# Patient Record
Sex: Female | Born: 1935
Health system: Southern US, Community
[De-identification: ages and names within clinical notes are randomized; demographics above are authoritative.]

## PROBLEM LIST (undated history)

## (undated) ENCOUNTER — Emergency Department (HOSPITAL_COMMUNITY): Admission: EM | Payer: PRIVATE HEALTH INSURANCE | Source: Home / Self Care

## (undated) DIAGNOSIS — F32A Depression, unspecified: Secondary | ICD-10-CM

## (undated) DIAGNOSIS — Z9889 Other specified postprocedural states: Secondary | ICD-10-CM

## (undated) DIAGNOSIS — K219 Gastro-esophageal reflux disease without esophagitis: Secondary | ICD-10-CM

## (undated) DIAGNOSIS — R112 Nausea with vomiting, unspecified: Secondary | ICD-10-CM

## (undated) DIAGNOSIS — F329 Major depressive disorder, single episode, unspecified: Secondary | ICD-10-CM

## (undated) DIAGNOSIS — C801 Malignant (primary) neoplasm, unspecified: Secondary | ICD-10-CM

## (undated) DIAGNOSIS — I10 Essential (primary) hypertension: Secondary | ICD-10-CM

## (undated) DIAGNOSIS — C50919 Malignant neoplasm of unspecified site of unspecified female breast: Secondary | ICD-10-CM

## (undated) DIAGNOSIS — Z923 Personal history of irradiation: Secondary | ICD-10-CM

## (undated) DIAGNOSIS — R011 Cardiac murmur, unspecified: Secondary | ICD-10-CM

## (undated) DIAGNOSIS — Z8489 Family history of other specified conditions: Secondary | ICD-10-CM

## (undated) DIAGNOSIS — E119 Type 2 diabetes mellitus without complications: Secondary | ICD-10-CM

## (undated) DIAGNOSIS — E039 Hypothyroidism, unspecified: Secondary | ICD-10-CM

## (undated) DIAGNOSIS — Z973 Presence of spectacles and contact lenses: Secondary | ICD-10-CM

## (undated) HISTORY — PX: BREAST LUMPECTOMY: SHX2

## (undated) HISTORY — PX: BUNIONECTOMY: SHX129

## (undated) HISTORY — PX: TONSILLECTOMY: SUR1361

## (undated) HISTORY — PX: ARCUATE KERATECTOMY: SHX212

## (undated) HISTORY — PX: ABDOMINAL HYSTERECTOMY: SHX81

## (undated) HISTORY — PX: OVARIAN CYST SURGERY: SHX726

## (undated) HISTORY — PX: APPENDECTOMY: SHX54

## (undated) HISTORY — PX: CARPAL TUNNEL RELEASE: SHX101

---

## 1976-11-25 HISTORY — PX: DEBRIDEMENT OF ABDOMINAL WALL ABSCESS: SHX6396

## 2008-11-25 DIAGNOSIS — Z923 Personal history of irradiation: Secondary | ICD-10-CM

## 2008-11-25 HISTORY — DX: Personal history of irradiation: Z92.3

## 2013-12-07 DIAGNOSIS — E119 Type 2 diabetes mellitus without complications: Secondary | ICD-10-CM | POA: Diagnosis not present

## 2013-12-07 DIAGNOSIS — B351 Tinea unguium: Secondary | ICD-10-CM | POA: Diagnosis not present

## 2013-12-07 DIAGNOSIS — I10 Essential (primary) hypertension: Secondary | ICD-10-CM | POA: Diagnosis not present

## 2013-12-08 DIAGNOSIS — G5600 Carpal tunnel syndrome, unspecified upper limb: Secondary | ICD-10-CM | POA: Diagnosis not present

## 2013-12-16 DIAGNOSIS — G5600 Carpal tunnel syndrome, unspecified upper limb: Secondary | ICD-10-CM | POA: Diagnosis not present

## 2013-12-28 DIAGNOSIS — L570 Actinic keratosis: Secondary | ICD-10-CM | POA: Diagnosis not present

## 2013-12-28 DIAGNOSIS — L259 Unspecified contact dermatitis, unspecified cause: Secondary | ICD-10-CM | POA: Diagnosis not present

## 2014-01-12 DIAGNOSIS — E119 Type 2 diabetes mellitus without complications: Secondary | ICD-10-CM | POA: Diagnosis not present

## 2014-02-02 DIAGNOSIS — I10 Essential (primary) hypertension: Secondary | ICD-10-CM | POA: Diagnosis not present

## 2014-02-02 DIAGNOSIS — E119 Type 2 diabetes mellitus without complications: Secondary | ICD-10-CM | POA: Diagnosis not present

## 2014-02-09 DIAGNOSIS — I10 Essential (primary) hypertension: Secondary | ICD-10-CM | POA: Diagnosis not present

## 2014-02-09 DIAGNOSIS — Z0000 Encounter for general adult medical examination without abnormal findings: Secondary | ICD-10-CM | POA: Diagnosis not present

## 2014-02-09 DIAGNOSIS — E119 Type 2 diabetes mellitus without complications: Secondary | ICD-10-CM | POA: Diagnosis not present

## 2014-02-09 DIAGNOSIS — E039 Hypothyroidism, unspecified: Secondary | ICD-10-CM | POA: Diagnosis not present

## 2014-03-21 ENCOUNTER — Other Ambulatory Visit: Payer: Self-pay | Admitting: Geriatric Medicine

## 2014-03-21 ENCOUNTER — Encounter (INDEPENDENT_AMBULATORY_CARE_PROVIDER_SITE_OTHER): Payer: Self-pay

## 2014-03-21 ENCOUNTER — Ambulatory Visit
Admission: RE | Admit: 2014-03-21 | Discharge: 2014-03-21 | Disposition: A | Discharge status: Home / Self Care | Payer: Medicare Other | Source: Ambulatory Visit | Attending: Geriatric Medicine | Admitting: Geriatric Medicine

## 2014-03-21 DIAGNOSIS — M546 Pain in thoracic spine: Secondary | ICD-10-CM

## 2014-03-21 DIAGNOSIS — E7800 Pure hypercholesterolemia, unspecified: Secondary | ICD-10-CM | POA: Diagnosis not present

## 2014-03-21 DIAGNOSIS — E1129 Type 2 diabetes mellitus with other diabetic kidney complication: Secondary | ICD-10-CM | POA: Diagnosis not present

## 2014-03-21 DIAGNOSIS — I129 Hypertensive chronic kidney disease with stage 1 through stage 4 chronic kidney disease, or unspecified chronic kidney disease: Secondary | ICD-10-CM | POA: Diagnosis not present

## 2014-03-21 DIAGNOSIS — N183 Chronic kidney disease, stage 3 unspecified: Secondary | ICD-10-CM | POA: Diagnosis not present

## 2014-03-21 DIAGNOSIS — J309 Allergic rhinitis, unspecified: Secondary | ICD-10-CM | POA: Diagnosis not present

## 2014-05-09 DIAGNOSIS — G5600 Carpal tunnel syndrome, unspecified upper limb: Secondary | ICD-10-CM | POA: Diagnosis not present

## 2014-05-09 DIAGNOSIS — M19049 Primary osteoarthritis, unspecified hand: Secondary | ICD-10-CM | POA: Diagnosis not present

## 2014-05-17 ENCOUNTER — Other Ambulatory Visit: Payer: Self-pay | Admitting: Orthopedic Surgery

## 2014-05-18 ENCOUNTER — Encounter (HOSPITAL_BASED_OUTPATIENT_CLINIC_OR_DEPARTMENT_OTHER): Payer: Self-pay | Admitting: *Deleted

## 2014-05-18 NOTE — Progress Notes (Signed)
To come for [id number]

## 2014-05-19 ENCOUNTER — Encounter (HOSPITAL_BASED_OUTPATIENT_CLINIC_OR_DEPARTMENT_OTHER)
Admission: RE | Admit: 2014-05-19 | Discharge: 2014-05-19 | Disposition: A | Discharge status: Home / Self Care | Payer: Medicare Other | Source: Ambulatory Visit | Attending: Orthopedic Surgery | Admitting: Orthopedic Surgery

## 2014-05-19 ENCOUNTER — Other Ambulatory Visit: Payer: Self-pay

## 2014-05-19 DIAGNOSIS — Z01810 Encounter for preprocedural cardiovascular examination: Secondary | ICD-10-CM | POA: Insufficient documentation

## 2014-05-19 DIAGNOSIS — M129 Arthropathy, unspecified: Secondary | ICD-10-CM | POA: Diagnosis not present

## 2014-05-19 DIAGNOSIS — M65849 Other synovitis and tenosynovitis, unspecified hand: Secondary | ICD-10-CM | POA: Diagnosis not present

## 2014-05-19 DIAGNOSIS — E119 Type 2 diabetes mellitus without complications: Secondary | ICD-10-CM | POA: Diagnosis not present

## 2014-05-19 DIAGNOSIS — Z01812 Encounter for preprocedural laboratory examination: Secondary | ICD-10-CM | POA: Diagnosis not present

## 2014-05-19 DIAGNOSIS — M6530 Trigger finger, unspecified finger: Secondary | ICD-10-CM | POA: Diagnosis not present

## 2014-05-19 DIAGNOSIS — G5600 Carpal tunnel syndrome, unspecified upper limb: Secondary | ICD-10-CM | POA: Diagnosis not present

## 2014-05-19 DIAGNOSIS — I10 Essential (primary) hypertension: Secondary | ICD-10-CM | POA: Diagnosis not present

## 2014-05-19 DIAGNOSIS — M65839 Other synovitis and tenosynovitis, unspecified forearm: Secondary | ICD-10-CM | POA: Diagnosis not present

## 2014-05-19 DIAGNOSIS — M720 Palmar fascial fibromatosis [Dupuytren]: Secondary | ICD-10-CM | POA: Diagnosis not present

## 2014-05-19 DIAGNOSIS — K219 Gastro-esophageal reflux disease without esophagitis: Secondary | ICD-10-CM | POA: Diagnosis not present

## 2014-05-19 LAB — BASIC METABOLIC PANEL
BUN: 19 mg/dL (ref 6–23)
CHLORIDE: 100 meq/L (ref 96–112)
CO2: 24 mEq/L (ref 19–32)
CREATININE: 0.93 mg/dL (ref 0.50–1.10)
Calcium: 9.5 mg/dL (ref 8.4–10.5)
GFR calc non Af Amer: 58 mL/min — ABNORMAL LOW (ref >90)
GFR, EST AFRICAN AMERICAN: 67 mL/min — AB (ref >90)
Glucose, Bld: 132 mg/dL — ABNORMAL HIGH (ref 70–99)
POTASSIUM: 4.2 meq/L (ref 3.7–5.3)
Sodium: 139 mEq/L (ref 137–147)

## 2014-05-23 NOTE — H&P (Addendum)
  [name] is an 80 y.o. female.   Chief Complaint: c/o chronic and progressive numbness and tingling of the left hand HPI: . [name] is a very active 80 year old woman who recently moved from [place] to [place]. She is retired. She has had type II diabetes for 5 years. She is hypothyroid. She had a mild stroke in January 2015. 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   While in [facility], she was worked up by Dr. Augustin Coupe, a hand surgeon at [facility]. She had electrodiagnostic studies performed by Dr. Alyson Ingles documenting left carpal tunnel syndrome. She has had a prior right carpal tunnel release more than 25 years ago  Past Medical History  Diagnosis Date  . Hypertension   . Diabetes mellitus without complication   . Arthritis   . GERD (gastroesophageal reflux disease)   . Wears glasses     Past Surgical History  Procedure Laterality Date  . Tonsillectomy    . Appendectomy    . Abdominal hysterectomy    . Ovarian cyst surgery      x2-  . Debridement of abdominal wall abscess  1978  . Carpal tunnel release      right  . Bunionectomy      right  . Arcuate keratectomy      History reviewed. No pertinent family history. Social History:  reports that she has never smoked. She does not have any smokeless tobacco history on file. She reports that she does not drink alcohol or use illicit drugs.  Allergies:  Allergies  Allergen Reactions  . Penicillins Swelling    No prescriptions prior to admission    No results found for this or any previous visit (from the past 48 hour(s)).  No results found.   Pertinent items are noted in HPI.  Height 5' (1.524 m), weight 65.772 kg (145 lb).  General appearance: alert Head: Normocephalic, without obvious abnormality Neck: supple, symmetrical, trachea midline Resp: clear to auscultation bilaterally Cardio: regular rate and rhythm GI: normal findings: bowel  sounds normal Extremities: Inspection of her hands reveals bilateral thumb CMC arthrosis with shouldering. She has pain with CMC translation and grind. Examination of her fingers and thumb reveals mild stenosing tenosynovitis of her left thumb at the A-1 pulley. She has diminished sensibility in the median distribution of the left hand. She has been advised that she has had abnormal electrodiagnostic studies in [facility] on the left side. She has well healed carpal tunnel release scar on the right. Repeat exam in the holding area reveals triggering of the left thumb at the A 1 pulley.  Pulses: 2+ and symmetric Skin: normal Neurologic: Grossly normal    Assessment/Plan Impression: Left CTS, left thumb stenosing tenosynovitis.  Plan: To the OR for left CTR and release of left thumb A 1 pulley. The procedure, risks,benefits and post-op course were discussed with the patient at length and they were in agreement with the plan.  DASNOIT,ROBERT J 05/23/2014, 3:51 PM   H&P documentation: 05/24/2014  -History and Physical Reviewed  -Patient has been re-examined  -No change in the plan of care  Cammie Sickle, MD

## 2014-05-24 ENCOUNTER — Ambulatory Visit (HOSPITAL_BASED_OUTPATIENT_CLINIC_OR_DEPARTMENT_OTHER): Payer: Medicare Other | Admitting: Anesthesiology

## 2014-05-24 ENCOUNTER — Encounter (HOSPITAL_BASED_OUTPATIENT_CLINIC_OR_DEPARTMENT_OTHER): Payer: Self-pay

## 2014-05-24 ENCOUNTER — Ambulatory Visit (HOSPITAL_BASED_OUTPATIENT_CLINIC_OR_DEPARTMENT_OTHER)
Admission: RE | Admit: 2014-05-24 | Discharge: 2014-05-24 | Disposition: A | Discharge status: Home / Self Care | Payer: Medicare Other | Source: Ambulatory Visit | Attending: Orthopedic Surgery | Admitting: Orthopedic Surgery

## 2014-05-24 ENCOUNTER — Encounter (HOSPITAL_BASED_OUTPATIENT_CLINIC_OR_DEPARTMENT_OTHER): Payer: Medicare Other | Admitting: Anesthesiology

## 2014-05-24 ENCOUNTER — Encounter (HOSPITAL_BASED_OUTPATIENT_CLINIC_OR_DEPARTMENT_OTHER)
Admission: RE | Disposition: A | Discharge status: Home / Self Care | Payer: Self-pay | Source: Ambulatory Visit | Attending: Orthopedic Surgery

## 2014-05-24 DIAGNOSIS — I10 Essential (primary) hypertension: Secondary | ICD-10-CM | POA: Diagnosis not present

## 2014-05-24 DIAGNOSIS — M720 Palmar fascial fibromatosis [Dupuytren]: Secondary | ICD-10-CM | POA: Diagnosis not present

## 2014-05-24 DIAGNOSIS — Z01812 Encounter for preprocedural laboratory examination: Secondary | ICD-10-CM | POA: Insufficient documentation

## 2014-05-24 DIAGNOSIS — M65839 Other synovitis and tenosynovitis, unspecified forearm: Secondary | ICD-10-CM | POA: Insufficient documentation

## 2014-05-24 DIAGNOSIS — K219 Gastro-esophageal reflux disease without esophagitis: Secondary | ICD-10-CM | POA: Insufficient documentation

## 2014-05-24 DIAGNOSIS — G5600 Carpal tunnel syndrome, unspecified upper limb: Secondary | ICD-10-CM | POA: Insufficient documentation

## 2014-05-24 DIAGNOSIS — M65849 Other synovitis and tenosynovitis, unspecified hand: Secondary | ICD-10-CM | POA: Diagnosis not present

## 2014-05-24 DIAGNOSIS — E119 Type 2 diabetes mellitus without complications: Secondary | ICD-10-CM | POA: Insufficient documentation

## 2014-05-24 DIAGNOSIS — M6530 Trigger finger, unspecified finger: Secondary | ICD-10-CM | POA: Insufficient documentation

## 2014-05-24 DIAGNOSIS — M129 Arthropathy, unspecified: Secondary | ICD-10-CM | POA: Insufficient documentation

## 2014-05-24 HISTORY — DX: Essential (primary) hypertension: I10

## 2014-05-24 HISTORY — DX: Type 2 diabetes mellitus without complications: E11.9

## 2014-05-24 HISTORY — DX: Gastro-esophageal reflux disease without esophagitis: K21.9

## 2014-05-24 HISTORY — PX: CARPAL TUNNEL RELEASE: SHX101

## 2014-05-24 HISTORY — DX: Presence of spectacles and contact lenses: Z97.3

## 2014-05-24 LAB — GLUCOSE, CAPILLARY
GLUCOSE-CAPILLARY: 105 mg/dL — AB (ref 70–99)
Glucose-Capillary: 94 mg/dL (ref 70–99)

## 2014-05-24 LAB — POCT HEMOGLOBIN-HEMACUE: Hemoglobin: 12.2 g/dL (ref 12.0–15.0)

## 2014-05-24 SURGERY — CARPAL TUNNEL RELEASE
Anesthesia: Monitor Anesthesia Care | Site: Wrist | Laterality: Left

## 2014-05-24 MED ORDER — MIDAZOLAM HCL 2 MG/2ML IJ SOLN: 1.0000 mg | INTRAMUSCULAR | Status: DC | PRN

## 2014-05-24 MED ORDER — PROPOFOL 10 MG/ML IV BOLUS
INTRAVENOUS | Status: DC | PRN
  Administered 2014-05-24: 20 mg via INTRAVENOUS
  Administered 2014-05-24 (×2): 10 mg via INTRAVENOUS

## 2014-05-24 MED ORDER — PROPOFOL 10 MG/ML IV BOLUS
INTRAVENOUS | Status: AC
  Filled 2014-05-24: qty 20

## 2014-05-24 MED ORDER — ONDANSETRON HCL 4 MG/2ML IJ SOLN
INTRAMUSCULAR | Status: DC | PRN
  Administered 2014-05-24: 4 mg via INTRAVENOUS

## 2014-05-24 MED ORDER — FENTANYL CITRATE 0.05 MG/ML IJ SOLN: 50.0000 ug | INTRAMUSCULAR | Status: DC | PRN

## 2014-05-24 MED ORDER — CHLORHEXIDINE GLUCONATE 4 % EX LIQD: 60.0000 mL | Freq: Once | CUTANEOUS | Status: DC

## 2014-05-24 MED ORDER — ACETAMINOPHEN-CODEINE #3 300-30 MG PO TABS: 1.0000 | ORAL_TABLET | ORAL | Status: DC | PRN

## 2014-05-24 MED ORDER — FENTANYL CITRATE 0.05 MG/ML IJ SOLN
INTRAMUSCULAR | Status: DC | PRN
  Administered 2014-05-24: 50 ug via INTRAVENOUS
  Administered 2014-05-24 (×2): 25 ug via INTRAVENOUS

## 2014-05-24 MED ORDER — LIDOCAINE HCL 2 % IJ SOLN
INTRAMUSCULAR | Status: AC
  Filled 2014-05-24: qty 20

## 2014-05-24 MED ORDER — LIDOCAINE HCL 2 % IJ SOLN
INTRAMUSCULAR | Status: DC | PRN
  Administered 2014-05-24: 4.5 mL

## 2014-05-24 MED ORDER — HYDROMORPHONE HCL PF 1 MG/ML IJ SOLN: 0.2500 mg | INTRAMUSCULAR | Status: DC | PRN

## 2014-05-24 MED ORDER — LACTATED RINGERS IV SOLN
INTRAVENOUS | Status: DC
  Administered 2014-05-24: 10:00:00 via INTRAVENOUS

## 2014-05-24 MED ORDER — FENTANYL CITRATE 0.05 MG/ML IJ SOLN
INTRAMUSCULAR | Status: AC
  Filled 2014-05-24: qty 2

## 2014-05-24 SURGICAL SUPPLY — 42 items
BANDAGE ADH SHEER 1  50/CT (GAUZE/BANDAGES/DRESSINGS) IMPLANT
BANDAGE COBAN STERILE 2 (GAUZE/BANDAGES/DRESSINGS) ×3 IMPLANT
BANDAGE ELASTIC 3 VELCRO ST LF (GAUZE/BANDAGES/DRESSINGS) ×3 IMPLANT
BLADE SURG 15 STRL LF DISP TIS (BLADE) ×1 IMPLANT
BLADE SURG 15 STRL SS (BLADE) ×2
BNDG ESMARK 4X9 LF (GAUZE/BANDAGES/DRESSINGS) ×3 IMPLANT
BRUSH SCRUB EZ PLAIN DRY (MISCELLANEOUS) ×3 IMPLANT
CLOSURE WOUND 1/2 X4 (GAUZE/BANDAGES/DRESSINGS) ×1
CORDS BIPOLAR (ELECTRODE) IMPLANT
COVER MAYO STAND STRL (DRAPES) ×3 IMPLANT
COVER TABLE BACK 60X90 (DRAPES) ×3 IMPLANT
CUFF TOURNIQUET SINGLE 18IN (TOURNIQUET CUFF) ×3 IMPLANT
DECANTER SPIKE VIAL GLASS SM (MISCELLANEOUS) ×3 IMPLANT
DRAPE EXTREMITY T 121X128X90 (DRAPE) ×3 IMPLANT
DRAPE SURG 17X23 STRL (DRAPES) ×3 IMPLANT
GAUZE SPONGE 4X4 12PLY STRL (GAUZE/BANDAGES/DRESSINGS) ×3 IMPLANT
GLOVE BIO SURGEON STRL SZ 6.5 (GLOVE) ×2 IMPLANT
GLOVE BIO SURGEONS STRL SZ 6.5 (GLOVE) ×1
GLOVE BIOGEL M STRL SZ7.5 (GLOVE) ×3 IMPLANT
GLOVE BIOGEL PI IND STRL 7.0 (GLOVE) ×2 IMPLANT
GLOVE BIOGEL PI INDICATOR 7.0 (GLOVE) ×4
GLOVE ORTHO TXT STRL SZ7.5 (GLOVE) ×3 IMPLANT
GOWN STRL REUS W/ TWL LRG LVL3 (GOWN DISPOSABLE) ×1 IMPLANT
GOWN STRL REUS W/ TWL XL LVL3 (GOWN DISPOSABLE) ×1 IMPLANT
GOWN STRL REUS W/TWL LRG LVL3 (GOWN DISPOSABLE) ×2
GOWN STRL REUS W/TWL XL LVL3 (GOWN DISPOSABLE) ×2
NEEDLE 27GAX1X1/2 (NEEDLE) ×3 IMPLANT
PACK BASIN DAY SURGERY FS (CUSTOM PROCEDURE TRAY) ×3 IMPLANT
PAD CAST 3X4 CTTN HI CHSV (CAST SUPPLIES) ×1 IMPLANT
PADDING CAST ABS 4INX4YD NS (CAST SUPPLIES) ×2
PADDING CAST ABS COTTON 4X4 ST (CAST SUPPLIES) ×1 IMPLANT
PADDING CAST COTTON 3X4 STRL (CAST SUPPLIES) ×2
SPLINT PLASTER CAST XFAST 3X15 (CAST SUPPLIES) ×5 IMPLANT
SPLINT PLASTER XTRA FASTSET 3X (CAST SUPPLIES) ×10
STOCKINETTE 4X48 STRL (DRAPES) ×3 IMPLANT
STRIP CLOSURE SKIN 1/2X4 (GAUZE/BANDAGES/DRESSINGS) ×2 IMPLANT
SUT PROLENE 3 0 PS 2 (SUTURE) ×3 IMPLANT
SYR 3ML 23GX1 SAFETY (SYRINGE) IMPLANT
SYR CONTROL 10ML LL (SYRINGE) ×3 IMPLANT
TOWEL OR 17X24 6PK STRL BLUE (TOWEL DISPOSABLE) ×3 IMPLANT
TRAY DSU PREP LF (CUSTOM PROCEDURE TRAY) ×3 IMPLANT
UNDERPAD 30X30 INCONTINENT (UNDERPADS AND DIAPERS) ×3 IMPLANT

## 2014-05-24 NOTE — Discharge Instructions (Signed)

## 2014-05-24 NOTE — Anesthesia Procedure Notes (Signed)
Procedure Name: MAC Date/Time: 05/24/2014 10:48 AM Performed by: Lieutenant Diego Pre-anesthesia Checklist: Patient identified, Emergency Drugs available, Suction available and Patient being monitored Patient Re-evaluated:Patient Re-evaluated prior to inductionOxygen Delivery Method: Simple face mask Preoxygenation: Pre-oxygenation with 100% oxygen Intubation Type: IV induction Placement Confirmation: positive ETCO2

## 2014-05-24 NOTE — Anesthesia Postprocedure Evaluation (Signed)
  Anesthesia Post-op Note  Patient: [name]  Procedure(s) Performed: Procedure(s) with comments: LEFT CARPAL TUNNEL RELEASE LEFT THUMB TRIGGER FINGER RELEASE (Left) - left thumb also site of incision  Patient Location: PACU  Anesthesia Type: MAC  Level of Consciousness: awake and alert   Airway and Oxygen Therapy: Patient Spontanous Breathing  Post-op Pain: none  Post-op Assessment: Post-op Vital signs reviewed, Patient's Cardiovascular Status Stable and Respiratory Function Stable  Post-op Vital Signs: Reviewed  Filed Vitals:   05/24/14 1130  BP: 131/50  Pulse: 51  Temp: 36.7 C  Resp: 14    Complications: No apparent anesthesia complications

## 2014-05-24 NOTE — Anesthesia Preprocedure Evaluation (Signed)
Anesthesia Evaluation  Patient identified by MRN, date of birth, ID band Patient awake    Reviewed: Allergy & Precautions, H&P , NPO status , Patient's Chart, lab work & pertinent test results  Airway Mallampati: I TM Distance: >3 FB Neck ROM: Full    Dental no notable dental hx. (+) Teeth Intact, Dental Advisory Given   Pulmonary neg pulmonary ROS,  breath sounds clear to auscultation  Pulmonary exam normal       Cardiovascular hypertension, On Medications Rhythm:Regular Rate:Normal     Neuro/Psych negative neurological ROS  negative psych ROS   GI/Hepatic Neg liver ROS, GERD-  Medicated and Controlled,  Endo/Other  diabetes, Type 2, Oral Hypoglycemic Agents  Renal/GU negative Renal ROS  negative genitourinary   Musculoskeletal   Abdominal   Peds  Hematology negative hematology ROS (+)   Anesthesia Other Findings   Reproductive/Obstetrics negative OB ROS                           Anesthesia Physical Anesthesia Plan  ASA: II  Anesthesia Plan: MAC   Post-op Pain Management:    Induction: Intravenous  Airway Management Planned: Simple Face Mask  Additional Equipment:   Intra-op Plan:   Post-operative Plan:   Informed Consent: I have reviewed the patients History and Physical, chart, labs and discussed the procedure including the risks, benefits and alternatives for the proposed anesthesia with the patient or authorized representative who has indicated his/her understanding and acceptance.   Dental advisory given  Plan Discussed with: CRNA  Anesthesia Plan Comments:         Anesthesia Quick Evaluation

## 2014-05-24 NOTE — Transfer of Care (Signed)
Immediate Anesthesia Transfer of Care Note  Patient: [name]  Procedure(s) Performed: Procedure(s) with comments: LEFT CARPAL TUNNEL RELEASE LEFT THUMB TRIGGER FINGER RELEASE (Left) - left thumb also site of incision  Patient Location: PACU  Anesthesia Type:MAC  Level of Consciousness: awake and alert   Airway & Oxygen Therapy: Patient Spontanous Breathing and Patient connected to face mask oxygen  Post-op Assessment: Report given to PACU RN and Post -op Vital signs reviewed and stable  Post vital signs: Reviewed and stable  Complications: No apparent anesthesia complications

## 2014-05-24 NOTE — Op Note (Signed)
[id number] 

## 2014-05-24 NOTE — Brief Op Note (Signed)
05/24/2014  11:24 AM  PATIENT:  [name]  80 y.o. female  PRE-OPERATIVE DIAGNOSIS:  Left Carpal tunnel syndrome left trigger thumb  POST-OPERATIVE DIAGNOSIS:  left carpal tunnel syndrome left trigger thumb  PROCEDURE:  Procedure(s) with comments: LEFT CARPAL TUNNEL RELEASE LEFT THUMB TRIGGER FINGER RELEASE (Left) - left thumb also site of incision  SURGEON:  Surgeon(s) and Role:    * Cammie Sickle, MD - Primary  PHYSICIAN ASSISTANT:   ASSISTANTS: surgical tech   ANESTHESIA:   MAC  EBL:  Total I/O In: 500 [I.V.:500] Out: -   BLOOD ADMINISTERED:none  DRAINS: none   LOCAL MEDICATIONS USED:  LIDOCAINE   SPECIMEN:  No Specimen  DISPOSITION OF SPECIMEN:  N/A  COUNTS:  YES  TOURNIQUET:   Total Tourniquet Time Documented: Upper Arm (Left) - 18 minutes Total: Upper Arm (Left) - 18 minutes   DICTATION: .Other Dictation: Dictation Number [id number]  PLAN OF CARE: Discharge to home after PACU  PATIENT DISPOSITION:  PACU - hemodynamically stable.   Delay start of Pharmacological VTE agent (>24hrs) due to surgical blood loss or risk of bleeding: not applicable

## 2014-05-25 ENCOUNTER — Encounter (HOSPITAL_BASED_OUTPATIENT_CLINIC_OR_DEPARTMENT_OTHER): Payer: Self-pay | Admitting: Orthopedic Surgery

## 2014-05-25 NOTE — Op Note (Signed)
[name], [name] NO.:  [id number]  MEDICAL RECORD NO.:  [id number]  LOCATION:                                 FACILITY:  PHYSICIAN:  Youlanda Mighty. Monterrio Gerst, M.D.      DATE OF BIRTH:  DATE OF PROCEDURE:  05/24/2014 DATE OF DISCHARGE:                              OPERATIVE REPORT   PREOPERATIVE DIAGNOSES:  Chronic left carpal tunnel syndrome, also chronic stenosing tenosynovitis, left thumb A1 pulley, also incidental diagnosis of Dupuytren's palmar fibromatosis.  POSTOPERATIVE DIAGNOSES:  Chronic left carpal tunnel syndrome, also chronic stenosing tenosynovitis, left thumb A1 pulley, also incidental diagnosis of Dupuytren's palmar fibromatosis.  OPERATION: 1. Release of left transverse carpal ligament. 2. Through separate incision release of left thumb A1 pulley and     incidental excision of Dupuytren's nodule.  OPERATING SURGEON:  Youlanda Mighty. Ireoluwa Gorsline, MD  ASSISTANT:  Surgical technician.  ANESTHESIA:  A 2% lidocaine flexor sheath and metacarpal head level block of left thumb, also block of median nerve and palmar skin with field block anticipating carpal tunnel incision.  SUPERVISING ANESTHESIOLOGIST:  Soledad Gerlach, MD  INDICATIONS:  [name] is a 80 year old woman referred for evaluation and management of left hand numbness and triggering of left thumb.  She had a history of prior treatment in [place] state with a prior carpal tunnel release and attention to multiple trigger fingers. Clinical examination at our office revealed evidence of significant left carpal tunnel syndrome and triggering of the left thumb.  She had early Dupuytren's palmar fibromatosis and rather substantial nodules forming overlying the proximal thumb flexion crease on the left.  [name] has failed nonoperative measures.  Therefore, she requested that we proceed at this time with release of her left transverse carpal ligament and release of her left thumb A1  pulley.  DESCRIPTION OF PROCEDURE:  Aftercare potential risks and benefits were explained in detail, questions were answered.  Preoperatively, she was interviewed by Dr. Ola Spurr of Anesthesia. Dr. Ola Spurr suggested sedation and local anesthesia.  This was accepted by [name] and we discussed the technique of this with her in detail in the holding area.  We re-examined her hand and noted that her trigger thumb was still problematic.  Questions regarding the anticipated surgery was invited and answered in detail.  We discussed her tolerance of pain medications and decided that for postoperative analgesia she would use Tylenol with Codeine #3.  Questions were invited and answered.  Her left thumb and left palm were marked as a proper surgical site per protocol with a marking pen.  [name] was then transferred to room 1 of the [facility] where under Dr. Blane Ohara supervision IV sedation was provided.  The left hand was prepped with Betadine followed by infiltration of 2% lidocaine into the path of intended incision at the thumb flexion crease and in the region of the proximal palm and around the median nerve and distal forearm.  There was very significant Dupuytren's fibromatosis and large nodule noted at the site of the anticipated incision for the A1 pulley release. Our incisions will exacerbate her tendency to form palmar fibromatosis. This will also render retraction  of the radial proper digital nerve more challenging.  After few moments, excellent anesthesia was achieved.  The left hand and arm were then prepped with Betadine soap and solution and sterilely draped.  Following exsanguination of the left arm with an Esmarch bandage, the arterial tourniquet on the proximal brachium was inflated to 220 mmHg.  Procedure commenced with a routine surgical time-out. Thereafter, a short transverse incision was fashioned directly over the proximal thumb flexion  crease.  A large Dupuytren's nodule was noted. This was circumferentially dissected and resected.  The radial proper digital nerve was deep to the nodule.  This was gently retracted with a Ragnell retractor.  The A1 pulley was isolated, split with scalpel scissors.  Thereafter, [name] demonstrated full active flexion of her thumb IP joint.  The wound was repaired with intradermal 3-0 Prolene.  No attempt was made to perform a definitive palmar fibromatosis resection.  Attention was then directed to the proximal palm.  A short incision was fashioned in line of the ring finger.  Subcutaneous tissues were carefully divided taking care to identify the palmar fascia.  This was split longitudinally to reveal the common sensory branch of the median nerve and superficial palmar arch.  The distal margin of the transverse carpal ligament was isolated followed by sounding the canal.  The transverse carpal ligament was released along its ulnar border extending into the distal forearm.  Hemostasis was achieved with bipolar cautery. The carpal canal and ulnar bursa were inspected, no masses or other predicaments were noted.  The wound was then repaired with intradermal 3-0 Prolene suture and Steri-Strips.  For aftercare, [name] was placed in compressive dressing with a volar plaster splint maintaining the wrist in 15 degrees dorsiflexion of the thumb and thumb spica dressing.  She will be discharged to the care of her family.  She will use Tylenol with Codeine #3, 1 or 2 tablets p.o. q.4-6 hours p.r.n. pain as needed.     Youlanda Mighty Shadrach Bartunek, M.D.     RVS/MEDQ  D:  05/24/2014  T:  05/25/2014  Job:  [id number]

## 2014-05-27 ENCOUNTER — Emergency Department (HOSPITAL_BASED_OUTPATIENT_CLINIC_OR_DEPARTMENT_OTHER)
Admission: EM | Admit: 2014-05-27 | Discharge: 2014-05-27 | Disposition: A | Discharge status: Home / Self Care | Payer: Medicare Other | Attending: Emergency Medicine | Admitting: Emergency Medicine

## 2014-05-27 ENCOUNTER — Encounter (HOSPITAL_BASED_OUTPATIENT_CLINIC_OR_DEPARTMENT_OTHER): Payer: Self-pay | Admitting: Emergency Medicine

## 2014-05-27 DIAGNOSIS — K219 Gastro-esophageal reflux disease without esophagitis: Secondary | ICD-10-CM | POA: Insufficient documentation

## 2014-05-27 DIAGNOSIS — M129 Arthropathy, unspecified: Secondary | ICD-10-CM | POA: Diagnosis not present

## 2014-05-27 DIAGNOSIS — E119 Type 2 diabetes mellitus without complications: Secondary | ICD-10-CM | POA: Insufficient documentation

## 2014-05-27 DIAGNOSIS — Z79899 Other long term (current) drug therapy: Secondary | ICD-10-CM | POA: Diagnosis not present

## 2014-05-27 DIAGNOSIS — I10 Essential (primary) hypertension: Secondary | ICD-10-CM | POA: Insufficient documentation

## 2014-05-27 DIAGNOSIS — Z880 Allergy status to penicillin: Secondary | ICD-10-CM | POA: Diagnosis not present

## 2014-05-27 DIAGNOSIS — K649 Unspecified hemorrhoids: Secondary | ICD-10-CM | POA: Diagnosis not present

## 2014-05-27 DIAGNOSIS — K5900 Constipation, unspecified: Secondary | ICD-10-CM

## 2014-05-27 MED ORDER — SENNOSIDES-DOCUSATE SODIUM 8.6-50 MG PO TABS: 1.0000 | ORAL_TABLET | Freq: Two times a day (BID) | ORAL | Status: DC

## 2014-05-27 NOTE — ED Provider Notes (Signed)
CSN: [id number]     Arrival date & time 05/27/14  0042 History   First MD Initiated Contact with Patient 05/27/14 [phone]     Chief Complaint  Patient presents with  . Constipation     (Consider location/radiation/quality/duration/timing/severity/associated sxs/prior Treatment) HPI  This is a 80 year old female comes in today stating she has constipation. States she has not had a bowel movement for 2 days since a recent surgery. She has not been on any oral narcotics. She feels like there is a large ball of stool the rectum that she is unable to get out. She had some bleeding after straining today. She denies any abdominal pain, nausea, or vomiting. She has had a history of hemorrhoids in the past. She denies any weakness or lightheadedness.  Past Medical History  Diagnosis Date  . Hypertension   . Diabetes mellitus without complication   . Arthritis   . GERD (gastroesophageal reflux disease)   . Wears glasses    Past Surgical History  Procedure Laterality Date  . Tonsillectomy    . Appendectomy    . Abdominal hysterectomy    . Ovarian cyst surgery      x2-  . Debridement of abdominal wall abscess  1978  . Carpal tunnel release      right  . Bunionectomy      right  . Arcuate keratectomy    . Carpal tunnel release Left 05/24/2014    Procedure: LEFT CARPAL TUNNEL RELEASE LEFT THUMB TRIGGER FINGER RELEASE;  Surgeon: Cammie Sickle, MD;  Location: [place];  Service: Orthopedics;  Laterality: Left;  left thumb also site of incision   No family history on file. History  Substance Use Topics  . Smoking status: Never Smoker   . Smokeless tobacco: Not on file  . Alcohol Use: No   OB History   Grav Para Term Preterm Abortions TAB SAB Ect Mult Living                 Review of Systems  All other systems reviewed and are negative.     Allergies  Penicillins  Home Medications   Prior to Admission medications   Medication Sig Start Date End Date  Taking? Authorizing Provider  acetaminophen-codeine (TYLENOL #3) 300-30 MG per tablet Take 1 tablet by mouth every 4 (four) hours as needed for moderate pain. 05/24/14   Cammie Sickle, MD  Coenzyme Q10 (CO Q-10) 100 MG CAPS Take by mouth.    Historical Provider, MD  felodipine (PLENDIL) 10 MG 24 hr tablet Take 10 mg by mouth daily.    Historical Provider, MD  levothyroxine (SYNTHROID, LEVOTHROID) 50 MCG tablet Take 50 mcg by mouth daily before breakfast.    Historical Provider, MD  losartan (COZAAR) 100 MG tablet Take 100 mg by mouth daily.    Historical Provider, MD  Multiple Vitamins-Minerals (MULTIVITAMIN WITH MINERALS) tablet Take 1 tablet by mouth daily.    Historical Provider, MD  omeprazole (PRILOSEC) 20 MG capsule Take 20 mg by mouth daily.    Historical Provider, MD  simvastatin (ZOCOR) 20 MG tablet Take 20 mg by mouth daily.    Historical Provider, MD  sitaGLIPtin (JANUVIA) 100 MG tablet Take 100 mg by mouth daily.    Historical Provider, MD  vitamin B-12 (CYANOCOBALAMIN) 250 MCG tablet Take 250 mcg by mouth daily.    Historical Provider, MD   BP 149/67  Pulse 80  Temp(Src) 97.7 F (36.5 C) (Oral)  Resp 18  Wt 145 lb (65.772 kg)  SpO2 97% Physical Exam  Nursing note and vitals reviewed. Constitutional: She is oriented to person, place, and time. She appears well-developed and well-nourished.  HENT:  Head: Normocephalic and atraumatic.  Eyes: Pupils are equal, round, and reactive to light.  Neck: Normal range of motion. Neck supple.  Cardiovascular: Normal rate.   Pulmonary/Chest: Effort normal.  Abdominal: Soft. Bowel sounds are normal. She exhibits no distension and no mass. There is no tenderness. There is no rebound and no guarding.  Genitourinary:  Rectal exam reveals a large hemorrhoid that is not currently bleeding. There is soft stool in the rectal vault and some disimpaction was done  Musculoskeletal: Normal range of motion.  Neurological: She is alert and  oriented to person, place, and time. She has normal reflexes.  Skin: Skin is warm and dry.  Psychiatric: She has a normal mood and affect. Her behavior is normal. Judgment and thought content normal.    ED Course  Procedures (including critical care time) Labs Review Labs Reviewed - No data to display  Imaging Review No results found.   EKG Interpretation None      MDM   Final diagnoses:  Hemorrhoids, unspecified hemorrhoid type  Constipation, unspecified constipation type    Patient with some constipation and hemorrhoids. The stool is normal color and I feel the bleeding was secondary to the hemorrhoids but it is not currently bleeding. The stool in the rectal vault was soft although some was manually removed. I think it is likely that the rectal fullness sensation is more due to the hemorrhoid. She is advised regarding stool softeners and Emend for her hemorrhoids.    Shaune Pollack, MD 05/27/14 [phone]

## 2014-05-27 NOTE — ED Notes (Signed)
Last normal BM x 2 days ago  Feels constipated ,has been straining now has some bleeding

## 2014-05-27 NOTE — Discharge Instructions (Signed)
Constipation °Constipation is when a person: °· Poops (has a bowel movement) less than 3 times a week. °· Has a hard time pooping. °· Has poop that is dry, hard, or bigger than normal. °HOME CARE  °· Eat foods with a lot of fiber in them. This includes fruits, vegetables, beans, and whole grains such as brown rice. °· Avoid fatty foods and foods with a lot of sugar. This includes french fries, hamburgers, cookies, candy, and soda. °· If you are not getting enough fiber from food, take products with added fiber in them (supplements). °· Drink enough fluid to keep your pee (urine) clear or pale yellow. °· Exercise on a regular basis, or as told by your doctor. °· Go to the restroom when you feel like you need to poop. Do not hold it. °· Only take medicine as told by your doctor. Do not take medicines that help you poop (laxatives) without talking to your doctor first. °GET HELP RIGHT AWAY IF:  °· You have bright red blood in your poop (stool). °· Your constipation lasts more than 4 days or gets worse. °· You have belly (abdominal) or butt (rectal) pain. °· You have thin poop (as thin as a pencil). °· You lose weight, and it cannot be explained. °MAKE SURE YOU:  °· Understand these instructions. °· Will watch your condition. °· Will get help right away if you are not doing well or get worse. °Document Released: 04/29/2008 Document Revised: 11/16/2013 Document Reviewed: 08/23/2013 °ExitCare® Patient Information ©2015 [employer]. This information is not intended to replace advice given to you by your health care provider. Make sure you discuss any questions you have with your health care provider. ° °Hemorrhoids °Hemorrhoids are swollen veins around the rectum or anus. There are two types of hemorrhoids:  °· Internal hemorrhoids. These occur in the veins just inside the rectum. They may poke through to the outside and become irritated and painful. °· External hemorrhoids. These occur in the veins outside the anus and  can be felt as a painful swelling or hard lump near the anus. °CAUSES °· Pregnancy.   °· Obesity.   °· Constipation or diarrhea.   °· Straining to have a bowel movement.   °· Sitting for long periods on the toilet. °· Heavy lifting or other activity that caused you to strain. °· Anal intercourse. °SYMPTOMS  °· Pain.   °· Anal itching or irritation.   °· Rectal bleeding.   °· Fecal leakage.   °· Anal swelling.   °· One or more lumps around the anus.   °DIAGNOSIS  °Your caregiver may be able to diagnose hemorrhoids by visual examination. Other examinations or tests that may be performed include:  °· Examination of the rectal area with a gloved hand (digital rectal exam).   °· Examination of anal canal using a small tube (scope).   °· A blood test if you have lost a significant amount of blood. °· A test to look inside the colon (sigmoidoscopy or colonoscopy). °TREATMENT °Most hemorrhoids can be treated at home. However, if symptoms do not seem to be getting better or if you have a lot of rectal bleeding, your caregiver may perform a procedure to help make the hemorrhoids get smaller or remove them completely. Possible treatments include:  °· Placing a rubber band at the base of the hemorrhoid to cut off the circulation (rubber band ligation).   °· Injecting a chemical to shrink the hemorrhoid (sclerotherapy).   °· Using a tool to burn the hemorrhoid (infrared light therapy).   °· Surgically removing the hemorrhoid (  hemorrhoidectomy).   °· Stapling the hemorrhoid to block blood flow to the tissue (hemorrhoid stapling).   °HOME CARE INSTRUCTIONS  °· Eat foods with fiber, such as whole grains, beans, nuts, fruits, and vegetables. Ask your doctor about taking products with added fiber in them (fiber supplements). °· Increase fluid intake. Drink enough water and fluids to keep your urine clear or pale yellow.   °· Exercise regularly.   °· Go to the bathroom when you have the urge to have a bowel movement. Do not wait.    °· Avoid straining to have bowel movements.   °· Keep the anal area dry and clean. Use wet toilet paper or moist towelettes after a bowel movement.   °· Medicated creams and suppositories may be used or applied as directed.   °· Only take over-the-counter or prescription medicines as directed by your caregiver.   °· Take warm sitz baths for 15-20 minutes, 3-4 times a day to ease pain and discomfort.   °· Place ice packs on the hemorrhoids if they are tender and swollen. Using ice packs between sitz baths may be helpful.   °¨ Put ice in a plastic bag.   °¨ Place a towel between your skin and the bag.   °¨ Leave the ice on for 15-20 minutes, 3-4 times a day.   °· Do not use a donut-shaped pillow or sit on the toilet for long periods. This increases blood pooling and pain.   °SEEK MEDICAL CARE IF: °· You have increasing pain and swelling that is not controlled by treatment or medicine. °· You have uncontrolled bleeding. °· You have difficulty or you are unable to have a bowel movement. °· You have pain or inflammation outside the area of the hemorrhoids. °MAKE SURE YOU: °· Understand these instructions. °· Will watch your condition. °· Will get help right away if you are not doing well or get worse. °Document Released: 11/08/2000 Document Revised: 10/28/2012 Document Reviewed: 09/15/2012 °ExitCare® Patient Information ©2015 [employer]. This information is not intended to replace advice given to you by your health care provider. Make sure you discuss any questions you have with your health care provider. ° °

## 2014-05-27 NOTE — ED Notes (Signed)
Pt ambulating independently w/ steady gait on d/c in no acute distress, A&Ox4. D/c instructions reviewed w/ pt - pt denies any further questions or concerns at present. Rx given x1  

## 2014-07-18 DIAGNOSIS — Z79899 Other long term (current) drug therapy: Secondary | ICD-10-CM | POA: Diagnosis not present

## 2014-07-18 DIAGNOSIS — N183 Chronic kidney disease, stage 3 unspecified: Secondary | ICD-10-CM | POA: Diagnosis not present

## 2014-07-18 DIAGNOSIS — E7800 Pure hypercholesterolemia, unspecified: Secondary | ICD-10-CM | POA: Diagnosis not present

## 2014-07-18 DIAGNOSIS — Z1331 Encounter for screening for depression: Secondary | ICD-10-CM | POA: Diagnosis not present

## 2014-07-18 DIAGNOSIS — E1129 Type 2 diabetes mellitus with other diabetic kidney complication: Secondary | ICD-10-CM | POA: Diagnosis not present

## 2014-07-18 DIAGNOSIS — I129 Hypertensive chronic kidney disease with stage 1 through stage 4 chronic kidney disease, or unspecified chronic kidney disease: Secondary | ICD-10-CM | POA: Diagnosis not present

## 2014-07-25 DIAGNOSIS — Z79899 Other long term (current) drug therapy: Secondary | ICD-10-CM | POA: Diagnosis not present

## 2014-07-25 DIAGNOSIS — E7800 Pure hypercholesterolemia, unspecified: Secondary | ICD-10-CM | POA: Diagnosis not present

## 2014-07-25 DIAGNOSIS — I129 Hypertensive chronic kidney disease with stage 1 through stage 4 chronic kidney disease, or unspecified chronic kidney disease: Secondary | ICD-10-CM | POA: Diagnosis not present

## 2014-07-25 DIAGNOSIS — E1129 Type 2 diabetes mellitus with other diabetic kidney complication: Secondary | ICD-10-CM | POA: Diagnosis not present

## 2014-08-16 DIAGNOSIS — R070 Pain in throat: Secondary | ICD-10-CM | POA: Diagnosis not present

## 2014-08-16 DIAGNOSIS — B9789 Other viral agents as the cause of diseases classified elsewhere: Secondary | ICD-10-CM | POA: Diagnosis not present

## 2014-09-21 DIAGNOSIS — Z23 Encounter for immunization: Secondary | ICD-10-CM | POA: Diagnosis not present

## 2014-11-04 DIAGNOSIS — D2361 Other benign neoplasm of skin of right upper limb, including shoulder: Secondary | ICD-10-CM | POA: Diagnosis not present

## 2014-11-04 DIAGNOSIS — L400 Psoriasis vulgaris: Secondary | ICD-10-CM | POA: Diagnosis not present

## 2014-11-04 DIAGNOSIS — D225 Melanocytic nevi of trunk: Secondary | ICD-10-CM | POA: Diagnosis not present

## 2014-11-04 DIAGNOSIS — D1801 Hemangioma of skin and subcutaneous tissue: Secondary | ICD-10-CM | POA: Diagnosis not present

## 2014-11-04 DIAGNOSIS — L821 Other seborrheic keratosis: Secondary | ICD-10-CM | POA: Diagnosis not present

## 2014-11-08 DIAGNOSIS — B351 Tinea unguium: Secondary | ICD-10-CM | POA: Diagnosis not present

## 2014-11-08 DIAGNOSIS — I739 Peripheral vascular disease, unspecified: Secondary | ICD-10-CM | POA: Diagnosis not present

## 2014-11-08 DIAGNOSIS — L603 Nail dystrophy: Secondary | ICD-10-CM | POA: Diagnosis not present

## 2014-11-08 DIAGNOSIS — E1151 Type 2 diabetes mellitus with diabetic peripheral angiopathy without gangrene: Secondary | ICD-10-CM | POA: Diagnosis not present

## 2014-11-08 DIAGNOSIS — M79609 Pain in unspecified limb: Secondary | ICD-10-CM | POA: Diagnosis not present

## 2014-11-28 DIAGNOSIS — Z0000 Encounter for general adult medical examination without abnormal findings: Secondary | ICD-10-CM | POA: Diagnosis not present

## 2014-11-28 DIAGNOSIS — E1121 Type 2 diabetes mellitus with diabetic nephropathy: Secondary | ICD-10-CM | POA: Diagnosis not present

## 2014-11-28 DIAGNOSIS — Z1389 Encounter for screening for other disorder: Secondary | ICD-10-CM | POA: Diagnosis not present

## 2014-11-28 DIAGNOSIS — N183 Chronic kidney disease, stage 3 (moderate): Secondary | ICD-10-CM | POA: Diagnosis not present

## 2014-11-30 ENCOUNTER — Other Ambulatory Visit: Payer: Self-pay

## 2014-11-30 DIAGNOSIS — Z853 Personal history of malignant neoplasm of breast: Secondary | ICD-10-CM

## 2014-11-30 DIAGNOSIS — Z9889 Other specified postprocedural states: Secondary | ICD-10-CM

## 2014-11-30 DIAGNOSIS — Z1231 Encounter for screening mammogram for malignant neoplasm of breast: Secondary | ICD-10-CM

## 2015-01-12 DIAGNOSIS — Z961 Presence of intraocular lens: Secondary | ICD-10-CM | POA: Diagnosis not present

## 2015-01-12 DIAGNOSIS — E11329 Type 2 diabetes mellitus with mild nonproliferative diabetic retinopathy without macular edema: Secondary | ICD-10-CM | POA: Diagnosis not present

## 2015-01-19 DIAGNOSIS — M79609 Pain in unspecified limb: Secondary | ICD-10-CM | POA: Diagnosis not present

## 2015-01-19 DIAGNOSIS — E1151 Type 2 diabetes mellitus with diabetic peripheral angiopathy without gangrene: Secondary | ICD-10-CM | POA: Diagnosis not present

## 2015-01-19 DIAGNOSIS — L603 Nail dystrophy: Secondary | ICD-10-CM | POA: Diagnosis not present

## 2015-02-28 ENCOUNTER — Ambulatory Visit
Admission: RE | Admit: 2015-02-28 | Discharge: 2015-02-28 | Disposition: A | Discharge status: Home / Self Care | Payer: Medicare Other | Source: Ambulatory Visit

## 2015-02-28 DIAGNOSIS — Z1231 Encounter for screening mammogram for malignant neoplasm of breast: Secondary | ICD-10-CM

## 2015-02-28 DIAGNOSIS — Z9889 Other specified postprocedural states: Secondary | ICD-10-CM

## 2015-02-28 DIAGNOSIS — Z853 Personal history of malignant neoplasm of breast: Secondary | ICD-10-CM

## 2015-03-01 ENCOUNTER — Other Ambulatory Visit: Payer: Self-pay | Admitting: Geriatric Medicine

## 2015-03-01 DIAGNOSIS — Z853 Personal history of malignant neoplasm of breast: Secondary | ICD-10-CM

## 2015-03-15 ENCOUNTER — Other Ambulatory Visit: Payer: Self-pay | Admitting: Geriatric Medicine

## 2015-03-15 ENCOUNTER — Ambulatory Visit
Admission: RE | Admit: 2015-03-15 | Discharge: 2015-03-15 | Disposition: A | Discharge status: Home / Self Care | Payer: Medicare Other | Source: Ambulatory Visit | Attending: Geriatric Medicine | Admitting: Geriatric Medicine

## 2015-03-15 DIAGNOSIS — Z853 Personal history of malignant neoplasm of breast: Secondary | ICD-10-CM

## 2015-03-15 DIAGNOSIS — R928 Other abnormal and inconclusive findings on diagnostic imaging of breast: Secondary | ICD-10-CM | POA: Diagnosis not present

## 2015-04-10 ENCOUNTER — Ambulatory Visit: Payer: Medicare Other | Admitting: Podiatry

## 2015-04-26 ENCOUNTER — Encounter: Payer: Self-pay | Admitting: Podiatry

## 2015-04-26 ENCOUNTER — Ambulatory Visit (INDEPENDENT_AMBULATORY_CARE_PROVIDER_SITE_OTHER): Payer: Medicare Other | Admitting: Podiatry

## 2015-04-26 ENCOUNTER — Ambulatory Visit: Payer: Medicare Other | Admitting: Podiatry

## 2015-04-26 ENCOUNTER — Ambulatory Visit (INDEPENDENT_AMBULATORY_CARE_PROVIDER_SITE_OTHER): Payer: Medicare Other

## 2015-04-26 ENCOUNTER — Ambulatory Visit: Payer: Medicare Other

## 2015-04-26 VITALS — BP 161/75 | HR 58 | Temp 97.6°F | Resp 12

## 2015-04-26 DIAGNOSIS — M79671 Pain in right foot: Secondary | ICD-10-CM

## 2015-04-26 DIAGNOSIS — M79672 Pain in left foot: Secondary | ICD-10-CM

## 2015-04-26 DIAGNOSIS — R52 Pain, unspecified: Secondary | ICD-10-CM

## 2015-04-26 DIAGNOSIS — M722 Plantar fascial fibromatosis: Secondary | ICD-10-CM | POA: Diagnosis not present

## 2015-04-26 MED ORDER — TRIAMCINOLONE ACETONIDE 10 MG/ML IJ SUSP
10.0000 mg | Freq: Once | INTRAMUSCULAR | Status: AC
Start: 2015-04-26 — End: 2015-04-26
  Administered 2015-04-26: 10 mg

## 2015-04-26 NOTE — Progress Notes (Signed)
   Subjective:    Patient ID: [name], female    DOB: May 22, 1934, 81 y.o.   MRN: [id number]  HPI  N- pain L- left medial side of heel D-2wks, on and off O-suddenly C-sharpe, like stepping on a rock A-walking on it T-magnilife pain relieving foot cream, calms somewhat  Patient presents today with "left heel pain, comes and goes, restless B/L ball of feet at night"  Patient is quite active throughout the day if she is taking care of her husband who is suffering from dementia  She complains of intermittent pain (crawling-like sensation) at bedtime in the plantar MPJ right and left causing some sleep difficulty at times. The symptoms of a crawling-like sensation in the plantar MPJ also present for approximately 2 weeks  Patient denies any history of foot ulceration or claudication  Review of Systems  Musculoskeletal: Positive for back pain.       Joint pain  Allergic/Immunologic: Positive for environmental allergies.       Objective:   Physical Exam  Orientated 3  Vascular: DP and PT pulses 2/4 bilaterally Capillary reflex immediate bilaterally  Neurological: Ankle reflex equal and reactive bilaterally Vibratory sensation intact bilaterally Sensation to 10 g monofilament wire intact 5/5 bilaterally  Dermatological: Texture and turgor adequate bilaterally Well-healed surgical scar dorsal first MPJ and dorsal second toe, right foot  Musculoskeletal: Hammertoe second left Exquisite palpable tenderness medial plantar fascial insertional area left without any palpable lesions There is no restriction in range of motion or crepitus and ankle, subtalar, midtarsal, MPJ bilaterally Palpation plantar MPJ 1-5 bilaterally elicits no palpable tenderness or palpable lesions Mild atrophy of plantar fat pad MPJ and heels bilaterally  X-ray examination of the right foot demonstrates retained internal screw fixation first metatarsal head Posterior and inferior calcaneal spurs.  There are no fractures and/or dislocations in the right foot Radiographic impression: No acute bony abnormality noted in the right foot  X-ray examination left foot demonstrates intact bony structure without a fracture and/or dislocation Posterior and inferior calcaneal spurs Radiographic impression: No acute bony abnormality noted left foot X-ray examination the right foot demonstrates evidence of internal screw fixation first metatarsal head       Assessment & Plan:   Assessment: Satisfactory neurovascular status MPJ  paresthesias may be beginning signs of neuropathy versus metatarsalgia bilaterally Plantar fasciitis left  Plan: Reviewed the results of patient's examination today with patient At this time patient's primary symptoms are heel pain and I offered patient Kenalog injection make him aware that her blood glucose most likely would elevate temporarily after the injection. Patient verbally consents. Skin is prepped with alcohol and Betadine and 10 mg of Kenalog mixed with 10 mg of plain Xylocaine and 2.5 mg of plain Marcaine injected inferior heel left for Kenalog injection #1. She tolerated the injections without any difficulty. Shoeing and stretching discussed  We discussed the paresthesias type symptoms and a plantar MPJ. At this time the symptoms are very short-term. I'm encouraging patient to wear athletic style shoes and if the symptoms are persistent or increase will further evaluate  Reappoint at patient's request

## 2015-04-26 NOTE — Patient Instructions (Signed)
Bent - Knee Calf Stretch  1) Stand an arm's length away from a wall. Place the palms of your hands on the wall. Step forward about 12 inches with the opposite foot.  2) Keeping toes pointed forward and both heels on the floor, bend both knees and lean forward. Hold this position for 60 seconds. Don't arch your back and don't hunch your shoulders.  3) Repeat this twice.  DO THIS STRETCHING TECHNIQUE 3 TIMES A DAY.   Stretching Exercises before Standing  Pull your toes up toward your nose and hold for 1 minute before standing.  A towel can assist with this exercise if you put the towel under the ball of your foot. This exercise reduces the intense   pain associated when changing from a seated to a standing position. This stretch can usually be the most beneficial if done before getting out of bed in the mornings.  \ Diabetes and Foot Care Diabetes may cause you to have problems because of poor blood supply (circulation) to your feet and legs. This may cause the skin on your feet to become thinner, break easier, and heal more slowly. Your skin may become dry, and the skin may peel and crack. You may also have nerve damage in your legs and feet causing decreased feeling in them. You may not notice minor injuries to your feet that could lead to infections or more serious problems. Taking care of your feet is one of the most important things you can do for yourself.  HOME CARE INSTRUCTIONS  Wear shoes at all times, even in the house. Do not go barefoot. Bare feet are easily injured.  Check your feet daily for blisters, cuts, and redness. If you cannot see the bottom of your feet, use a mirror or ask someone for help.  Wash your feet with warm water (do not use hot water) and mild soap. Then pat your feet and the areas between your toes until they are completely dry. Do not soak your feet as this can dry your skin.  Apply a moisturizing lotion or petroleum jelly (that does not contain alcohol and  is unscented) to the skin on your feet and to dry, brittle toenails. Do not apply lotion between your toes.  Trim your toenails straight across. Do not dig under them or around the cuticle. File the edges of your nails with an emery board or nail file.  Do not cut corns or calluses or try to remove them with medicine.  Wear clean socks or stockings every day. Make sure they are not too tight. Do not wear knee-high stockings since they may decrease blood flow to your legs.  Wear shoes that fit properly and have enough cushioning. To break in new shoes, wear them for just a few hours a day. This prevents you from injuring your feet. Always look in your shoes before you put them on to be sure there are no objects inside.  Do not cross your legs. This may decrease the blood flow to your feet.  If you find a minor scrape, cut, or break in the skin on your feet, keep it and the skin around it clean and dry. These areas may be cleansed with mild soap and water. Do not cleanse the area with peroxide, alcohol, or iodine.  When you remove an adhesive bandage, be sure not to damage the skin around it.  If you have a wound, look at it several times a day to make sure it  is healing.  Do not use heating pads or hot water bottles. They may burn your skin. If you have lost feeling in your feet or legs, you may not know it is happening until it is too late.  Make sure your health care provider performs a complete foot exam at least annually or more often if you have foot problems. Report any cuts, sores, or bruises to your health care provider immediately. SEEK MEDICAL CARE IF:   You have an injury that is not healing.  You have cuts or breaks in the skin.  You have an ingrown nail.  You notice redness on your legs or feet.  You feel burning or tingling in your legs or feet.  You have pain or cramps in your legs and feet.  Your legs or feet are numb.  Your feet always feel cold. SEEK IMMEDIATE  MEDICAL CARE IF:   There is increasing redness, swelling, or pain in or around a wound.  There is a red line that goes up your leg.  Pus is coming from a wound.  You develop a fever or as directed by your health care provider.  You notice a bad smell coming from an ulcer or wound. Document Released: 11/08/2000 Document Revised: 07/14/2013 Document Reviewed: 04/20/2013 [facility] Patient Information 2015 [place], [place]. This information is not intended to replace advice given to you by your health care provider. Make sure you discuss any questions you have with your health care provider.  Plantar Fasciitis Plantar fasciitis is a common condition that causes foot pain. It is soreness (inflammation) of the band of tough fibrous tissue on the bottom of the foot that runs from the heel bone (calcaneus) to the ball of the foot. The cause of this soreness may be from excessive standing, poor fitting shoes, running on hard surfaces, being overweight, having an abnormal walk, or overuse (this is common in runners) of the painful foot or feet. It is also common in aerobic exercise dancers and ballet dancers. SYMPTOMS  Most people with plantar fasciitis complain of:  Severe pain in the morning on the bottom of their foot especially when taking the first steps out of bed. This pain recedes after a few minutes of walking.  Severe pain is experienced also during walking following a long period of inactivity.  Pain is worse when walking barefoot or up stairs DIAGNOSIS   Your caregiver will diagnose this condition by examining and feeling your foot.  Special tests such as X-rays of your foot, are usually not needed. PREVENTION   Consult a sports medicine professional before beginning a new exercise program.  Walking programs offer a good workout. With walking there is a lower chance of overuse injuries common to runners. There is less impact and less jarring of the joints.  Begin all new exercise  programs slowly. If problems or pain develop, decrease the amount of time or distance until you are at a comfortable level.  Wear good shoes and replace them regularly.  Stretch your foot and the heel cords at the back of the ankle (Achilles tendon) both before and after exercise.  Run or exercise on even surfaces that are not hard. For example, asphalt is better than pavement.  Do not run barefoot on hard surfaces.  If using a treadmill, vary the incline.  Do not continue to workout if you have foot or joint problems. Seek professional help if they do not improve. HOME CARE INSTRUCTIONS   Avoid activities that cause you  pain until you recover.  Use ice or cold packs on the problem or painful areas after working out.  Only take over-the-counter or prescription medicines for pain, discomfort, or fever as directed by your caregiver.  Soft shoe inserts or athletic shoes with air or gel sole cushions may be helpful.  If problems continue or become more severe, consult a sports medicine caregiver or your own health care provider. Cortisone is a potent anti-inflammatory medication that may be injected into the painful area. You can discuss this treatment with your caregiver. MAKE SURE YOU:   Understand these instructions.  Will watch your condition.  Will get help right away if you are not doing well or get worse. Document Released: 08/06/2001 Document Revised: 02/03/2012 Document Reviewed: 10/05/2008 [facility] Patient Information 2015 [place], [place]. This information is not intended to replace advice given to you by your health care provider. Make sure you discuss any questions you have with your health care provider.

## 2015-05-07 DIAGNOSIS — J00 Acute nasopharyngitis [common cold]: Secondary | ICD-10-CM | POA: Diagnosis not present

## 2015-05-10 DIAGNOSIS — L720 Epidermal cyst: Secondary | ICD-10-CM | POA: Diagnosis not present

## 2015-05-10 DIAGNOSIS — L400 Psoriasis vulgaris: Secondary | ICD-10-CM | POA: Diagnosis not present

## 2015-05-10 DIAGNOSIS — D2239 Melanocytic nevi of other parts of face: Secondary | ICD-10-CM | POA: Diagnosis not present

## 2015-05-30 DIAGNOSIS — E039 Hypothyroidism, unspecified: Secondary | ICD-10-CM | POA: Diagnosis not present

## 2015-05-30 DIAGNOSIS — N183 Chronic kidney disease, stage 3 (moderate): Secondary | ICD-10-CM | POA: Diagnosis not present

## 2015-05-30 DIAGNOSIS — E11329 Type 2 diabetes mellitus with mild nonproliferative diabetic retinopathy without macular edema: Secondary | ICD-10-CM | POA: Diagnosis not present

## 2015-05-30 DIAGNOSIS — Z79899 Other long term (current) drug therapy: Secondary | ICD-10-CM | POA: Diagnosis not present

## 2015-05-30 DIAGNOSIS — E1121 Type 2 diabetes mellitus with diabetic nephropathy: Secondary | ICD-10-CM | POA: Diagnosis not present

## 2015-05-30 DIAGNOSIS — I129 Hypertensive chronic kidney disease with stage 1 through stage 4 chronic kidney disease, or unspecified chronic kidney disease: Secondary | ICD-10-CM | POA: Diagnosis not present

## 2015-05-30 DIAGNOSIS — R05 Cough: Secondary | ICD-10-CM | POA: Diagnosis not present

## 2015-05-30 DIAGNOSIS — Z780 Asymptomatic menopausal state: Secondary | ICD-10-CM | POA: Diagnosis not present

## 2015-05-30 DIAGNOSIS — M8589 Other specified disorders of bone density and structure, multiple sites: Secondary | ICD-10-CM | POA: Diagnosis not present

## 2015-07-10 DIAGNOSIS — N183 Chronic kidney disease, stage 3 (moderate): Secondary | ICD-10-CM | POA: Diagnosis not present

## 2015-07-10 DIAGNOSIS — E1121 Type 2 diabetes mellitus with diabetic nephropathy: Secondary | ICD-10-CM | POA: Diagnosis not present

## 2015-07-10 DIAGNOSIS — I872 Venous insufficiency (chronic) (peripheral): Secondary | ICD-10-CM | POA: Diagnosis not present

## 2015-07-10 DIAGNOSIS — I129 Hypertensive chronic kidney disease with stage 1 through stage 4 chronic kidney disease, or unspecified chronic kidney disease: Secondary | ICD-10-CM | POA: Diagnosis not present

## 2015-07-12 DIAGNOSIS — M659 Synovitis and tenosynovitis, unspecified: Secondary | ICD-10-CM | POA: Insufficient documentation

## 2015-07-12 DIAGNOSIS — M65352 Trigger finger, left little finger: Secondary | ICD-10-CM | POA: Diagnosis not present

## 2015-07-12 DIAGNOSIS — M65342 Trigger finger, left ring finger: Secondary | ICD-10-CM | POA: Diagnosis not present

## 2015-08-30 DIAGNOSIS — Z23 Encounter for immunization: Secondary | ICD-10-CM | POA: Diagnosis not present

## 2015-09-08 DIAGNOSIS — M65352 Trigger finger, left little finger: Secondary | ICD-10-CM | POA: Diagnosis not present

## 2015-09-08 DIAGNOSIS — M65341 Trigger finger, right ring finger: Secondary | ICD-10-CM | POA: Diagnosis not present

## 2015-09-08 DIAGNOSIS — M65342 Trigger finger, left ring finger: Secondary | ICD-10-CM | POA: Diagnosis not present

## 2015-10-03 ENCOUNTER — Ambulatory Visit: Payer: Medicare Other | Admitting: Podiatry

## 2015-10-06 DIAGNOSIS — R112 Nausea with vomiting, unspecified: Secondary | ICD-10-CM | POA: Diagnosis not present

## 2015-10-06 DIAGNOSIS — I129 Hypertensive chronic kidney disease with stage 1 through stage 4 chronic kidney disease, or unspecified chronic kidney disease: Secondary | ICD-10-CM | POA: Diagnosis not present

## 2015-10-06 DIAGNOSIS — N183 Chronic kidney disease, stage 3 (moderate): Secondary | ICD-10-CM | POA: Diagnosis not present

## 2015-10-06 DIAGNOSIS — E1121 Type 2 diabetes mellitus with diabetic nephropathy: Secondary | ICD-10-CM | POA: Diagnosis not present

## 2015-10-10 ENCOUNTER — Ambulatory Visit (INDEPENDENT_AMBULATORY_CARE_PROVIDER_SITE_OTHER): Payer: Medicare Other | Admitting: Podiatry

## 2015-10-10 ENCOUNTER — Encounter: Payer: Self-pay | Admitting: Podiatry

## 2015-10-10 VITALS — BP 154/70 | HR 69 | Resp 12

## 2015-10-10 DIAGNOSIS — M722 Plantar fascial fibromatosis: Secondary | ICD-10-CM | POA: Diagnosis not present

## 2015-10-10 DIAGNOSIS — M79674 Pain in right toe(s): Secondary | ICD-10-CM

## 2015-10-10 DIAGNOSIS — M79675 Pain in left toe(s): Secondary | ICD-10-CM

## 2015-10-10 DIAGNOSIS — B351 Tinea unguium: Secondary | ICD-10-CM | POA: Diagnosis not present

## 2015-10-10 MED ORDER — TRIAMCINOLONE ACETONIDE 10 MG/ML IJ SUSP
10.0000 mg | Freq: Once | INTRAMUSCULAR | Status: AC
  Administered 2015-10-10: 10 mg

## 2015-10-10 NOTE — Progress Notes (Signed)
   Subjective:    Patient ID: [name], female    DOB: [birth year], 81 y.o.   MRN: [id number]  HPI This patient presents again complaining of sharp pain at times which is continuous with weightbearing on the medial left heel and ankle area. The symptoms have recurred in the past month. The symptoms are relieved with rest. On the visit of 04/26/2015 patient had Kenalog injection into the inferior left heel for fasciitis which he said provided dramatic relief for these similar symptoms until the past month.  Today she is also complaining of painful toenails with close shoes and walking and she said she would like to have the nails debrided    Review of Systems  Skin: Positive for color change.       Objective:   Physical Exam   orientated 3  Vascular: DP and PT pulses 2/4 bilaterally Capillary reflex immediate bilaterally  Neurological: Negative Tinel sign over tarsal tunnel left  Dermatological: No open skin lesions bilaterally The toenails are elongated, incurvated, discolored, hypertrophic and tender direct palpation 6-10  Musculoskeletal: Exquisite palpable tenderness medial plantar fascial insertional area left which duplicates patient's symptoms       Assessment & Plan:    assessment: Plantar fasciitis left Cannot rule out tarsal tunnel syndrome left Symptomatic onychomycoses 6-10  Plan: At this time because patient's primary pain was duplicated when the medial insertional area the plantar fascia was compressed I offered patient an additional Kenalog injection she verbally consents. Skin is prepped with alcohol and Betadine and 10 mg of Kenalog mixed with 10 mg of plain Xylocaine and 2.5 mg of plain Marcaine were injected inferior heel left for Kenalog injection #2. Patient tolerated procedure without any difficulty  The toenails 6-10 were debrided mechanically and electrically without a bleeding  Reappoint at patient's request

## 2015-11-30 DIAGNOSIS — Z7984 Long term (current) use of oral hypoglycemic drugs: Secondary | ICD-10-CM | POA: Diagnosis not present

## 2015-11-30 DIAGNOSIS — I10 Essential (primary) hypertension: Secondary | ICD-10-CM | POA: Diagnosis not present

## 2015-11-30 DIAGNOSIS — N183 Chronic kidney disease, stage 3 (moderate): Secondary | ICD-10-CM | POA: Diagnosis not present

## 2015-11-30 DIAGNOSIS — I129 Hypertensive chronic kidney disease with stage 1 through stage 4 chronic kidney disease, or unspecified chronic kidney disease: Secondary | ICD-10-CM | POA: Diagnosis not present

## 2015-11-30 DIAGNOSIS — E11319 Type 2 diabetes mellitus with unspecified diabetic retinopathy without macular edema: Secondary | ICD-10-CM | POA: Diagnosis not present

## 2015-11-30 DIAGNOSIS — Z79899 Other long term (current) drug therapy: Secondary | ICD-10-CM | POA: Diagnosis not present

## 2015-11-30 DIAGNOSIS — Z0000 Encounter for general adult medical examination without abnormal findings: Secondary | ICD-10-CM | POA: Diagnosis not present

## 2015-11-30 DIAGNOSIS — E1121 Type 2 diabetes mellitus with diabetic nephropathy: Secondary | ICD-10-CM | POA: Diagnosis not present

## 2015-11-30 DIAGNOSIS — Z1389 Encounter for screening for other disorder: Secondary | ICD-10-CM | POA: Diagnosis not present

## 2016-01-04 DIAGNOSIS — I83813 Varicose veins of bilateral lower extremities with pain: Secondary | ICD-10-CM | POA: Diagnosis not present

## 2016-01-04 DIAGNOSIS — I8311 Varicose veins of right lower extremity with inflammation: Secondary | ICD-10-CM | POA: Diagnosis not present

## 2016-01-04 DIAGNOSIS — I83893 Varicose veins of bilateral lower extremities with other complications: Secondary | ICD-10-CM | POA: Diagnosis not present

## 2016-01-04 DIAGNOSIS — I8312 Varicose veins of left lower extremity with inflammation: Secondary | ICD-10-CM | POA: Diagnosis not present

## 2016-01-12 DIAGNOSIS — M65352 Trigger finger, left little finger: Secondary | ICD-10-CM | POA: Diagnosis not present

## 2016-01-12 DIAGNOSIS — M65342 Trigger finger, left ring finger: Secondary | ICD-10-CM | POA: Diagnosis not present

## 2016-01-16 ENCOUNTER — Other Ambulatory Visit: Payer: Self-pay | Admitting: Orthopedic Surgery

## 2016-01-18 DIAGNOSIS — Z961 Presence of intraocular lens: Secondary | ICD-10-CM | POA: Diagnosis not present

## 2016-01-18 DIAGNOSIS — Z0100 Encounter for examination of eyes and vision without abnormal findings: Secondary | ICD-10-CM | POA: Diagnosis not present

## 2016-01-18 DIAGNOSIS — E113291 Type 2 diabetes mellitus with mild nonproliferative diabetic retinopathy without macular edema, right eye: Secondary | ICD-10-CM | POA: Diagnosis not present

## 2016-01-19 ENCOUNTER — Encounter (HOSPITAL_BASED_OUTPATIENT_CLINIC_OR_DEPARTMENT_OTHER): Payer: Self-pay | Admitting: *Deleted

## 2016-01-19 NOTE — Progress Notes (Signed)
Bring all medications. 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 May need to be done day of surgery.

## 2016-01-22 ENCOUNTER — Encounter (HOSPITAL_BASED_OUTPATIENT_CLINIC_OR_DEPARTMENT_OTHER)
Admission: RE | Admit: 2016-01-22 | Discharge: 2016-01-22 | Disposition: A | Discharge status: Home / Self Care | Payer: Medicare Other | Source: Ambulatory Visit | Attending: Orthopedic Surgery | Admitting: Orthopedic Surgery

## 2016-01-22 DIAGNOSIS — Z853 Personal history of malignant neoplasm of breast: Secondary | ICD-10-CM | POA: Diagnosis not present

## 2016-01-22 DIAGNOSIS — I10 Essential (primary) hypertension: Secondary | ICD-10-CM | POA: Diagnosis not present

## 2016-01-22 DIAGNOSIS — G5602 Carpal tunnel syndrome, left upper limb: Secondary | ICD-10-CM | POA: Diagnosis not present

## 2016-01-22 DIAGNOSIS — M65342 Trigger finger, left ring finger: Secondary | ICD-10-CM | POA: Diagnosis not present

## 2016-01-22 DIAGNOSIS — Z87891 Personal history of nicotine dependence: Secondary | ICD-10-CM | POA: Diagnosis not present

## 2016-01-22 DIAGNOSIS — Z8673 Personal history of transient ischemic attack (TIA), and cerebral infarction without residual deficits: Secondary | ICD-10-CM | POA: Diagnosis not present

## 2016-01-22 DIAGNOSIS — Z79899 Other long term (current) drug therapy: Secondary | ICD-10-CM | POA: Diagnosis not present

## 2016-01-22 DIAGNOSIS — E039 Hypothyroidism, unspecified: Secondary | ICD-10-CM | POA: Diagnosis not present

## 2016-01-22 DIAGNOSIS — K219 Gastro-esophageal reflux disease without esophagitis: Secondary | ICD-10-CM | POA: Diagnosis not present

## 2016-01-22 DIAGNOSIS — M19049 Primary osteoarthritis, unspecified hand: Secondary | ICD-10-CM | POA: Diagnosis not present

## 2016-01-22 DIAGNOSIS — E7800 Pure hypercholesterolemia, unspecified: Secondary | ICD-10-CM | POA: Diagnosis not present

## 2016-01-22 DIAGNOSIS — E119 Type 2 diabetes mellitus without complications: Secondary | ICD-10-CM | POA: Diagnosis not present

## 2016-01-22 DIAGNOSIS — Z7984 Long term (current) use of oral hypoglycemic drugs: Secondary | ICD-10-CM | POA: Diagnosis not present

## 2016-01-22 DIAGNOSIS — M65352 Trigger finger, left little finger: Secondary | ICD-10-CM | POA: Diagnosis not present

## 2016-01-22 LAB — BASIC METABOLIC PANEL
Anion gap: 8 (ref 5–15)
BUN: 13 mg/dL (ref 6–20)
CHLORIDE: 105 mmol/L (ref 101–111)
CO2: 26 mmol/L (ref 22–32)
Calcium: 9.3 mg/dL (ref 8.9–10.3)
Creatinine, Ser: 0.85 mg/dL (ref 0.44–1.00)
GFR calc non Af Amer: >60 mL/min (ref >60)
Glucose, Bld: 166 mg/dL — ABNORMAL HIGH (ref 65–99)
POTASSIUM: 4.6 mmol/L (ref 3.5–5.1)
SODIUM: 139 mmol/L (ref 135–145)

## 2016-01-25 ENCOUNTER — Encounter (HOSPITAL_BASED_OUTPATIENT_CLINIC_OR_DEPARTMENT_OTHER): Payer: Self-pay | Admitting: Certified Registered"

## 2016-01-25 ENCOUNTER — Encounter (HOSPITAL_BASED_OUTPATIENT_CLINIC_OR_DEPARTMENT_OTHER)
Admission: RE | Disposition: A | Discharge status: Home / Self Care | Payer: Self-pay | Source: Ambulatory Visit | Attending: Orthopedic Surgery

## 2016-01-25 ENCOUNTER — Ambulatory Visit (HOSPITAL_BASED_OUTPATIENT_CLINIC_OR_DEPARTMENT_OTHER): Payer: Medicare Other | Admitting: Certified Registered"

## 2016-01-25 ENCOUNTER — Ambulatory Visit (HOSPITAL_BASED_OUTPATIENT_CLINIC_OR_DEPARTMENT_OTHER)
Admission: RE | Admit: 2016-01-25 | Discharge: 2016-01-25 | Disposition: A | Discharge status: Home / Self Care | Payer: Medicare Other | Source: Ambulatory Visit | Attending: Orthopedic Surgery | Admitting: Orthopedic Surgery

## 2016-01-25 DIAGNOSIS — M65352 Trigger finger, left little finger: Secondary | ICD-10-CM | POA: Diagnosis not present

## 2016-01-25 DIAGNOSIS — E7800 Pure hypercholesterolemia, unspecified: Secondary | ICD-10-CM | POA: Diagnosis not present

## 2016-01-25 DIAGNOSIS — M65342 Trigger finger, left ring finger: Secondary | ICD-10-CM | POA: Insufficient documentation

## 2016-01-25 DIAGNOSIS — I10 Essential (primary) hypertension: Secondary | ICD-10-CM | POA: Insufficient documentation

## 2016-01-25 DIAGNOSIS — Z7984 Long term (current) use of oral hypoglycemic drugs: Secondary | ICD-10-CM | POA: Insufficient documentation

## 2016-01-25 DIAGNOSIS — Z87891 Personal history of nicotine dependence: Secondary | ICD-10-CM | POA: Insufficient documentation

## 2016-01-25 DIAGNOSIS — M19049 Primary osteoarthritis, unspecified hand: Secondary | ICD-10-CM | POA: Insufficient documentation

## 2016-01-25 DIAGNOSIS — E039 Hypothyroidism, unspecified: Secondary | ICD-10-CM | POA: Insufficient documentation

## 2016-01-25 DIAGNOSIS — G5602 Carpal tunnel syndrome, left upper limb: Secondary | ICD-10-CM | POA: Insufficient documentation

## 2016-01-25 DIAGNOSIS — E119 Type 2 diabetes mellitus without complications: Secondary | ICD-10-CM | POA: Diagnosis not present

## 2016-01-25 DIAGNOSIS — K219 Gastro-esophageal reflux disease without esophagitis: Secondary | ICD-10-CM | POA: Insufficient documentation

## 2016-01-25 DIAGNOSIS — Z8673 Personal history of transient ischemic attack (TIA), and cerebral infarction without residual deficits: Secondary | ICD-10-CM | POA: Insufficient documentation

## 2016-01-25 DIAGNOSIS — Z853 Personal history of malignant neoplasm of breast: Secondary | ICD-10-CM | POA: Insufficient documentation

## 2016-01-25 DIAGNOSIS — Z79899 Other long term (current) drug therapy: Secondary | ICD-10-CM | POA: Insufficient documentation

## 2016-01-25 DIAGNOSIS — M65842 Other synovitis and tenosynovitis, left hand: Secondary | ICD-10-CM | POA: Diagnosis not present

## 2016-01-25 HISTORY — PX: TRIGGER FINGER RELEASE: SHX641

## 2016-01-25 HISTORY — DX: Hypothyroidism, unspecified: E03.9

## 2016-01-25 HISTORY — DX: Malignant (primary) neoplasm, unspecified: C80.1

## 2016-01-25 LAB — GLUCOSE, CAPILLARY
GLUCOSE-CAPILLARY: 119 mg/dL — AB (ref 65–99)
Glucose-Capillary: 148 mg/dL — ABNORMAL HIGH (ref 65–99)

## 2016-01-25 SURGERY — RELEASE, A1 PULLEY, FOR TRIGGER FINGER
Anesthesia: Monitor Anesthesia Care | Site: Finger | Laterality: Left

## 2016-01-25 MED ORDER — LIDOCAINE HCL (PF) 0.5 % IJ SOLN
INTRAMUSCULAR | Status: DC | PRN
  Administered 2016-01-25: 25 mL via INTRAVENOUS

## 2016-01-25 MED ORDER — LIDOCAINE HCL (CARDIAC) 20 MG/ML IV SOLN
INTRAVENOUS | Status: DC | PRN
  Administered 2016-01-25: 20 mg via INTRAVENOUS

## 2016-01-25 MED ORDER — VANCOMYCIN HCL IN DEXTROSE 1-5 GM/200ML-% IV SOLN
1000.0000 mg | INTRAVENOUS | Status: AC
  Administered 2016-01-25: 1000 mg via INTRAVENOUS

## 2016-01-25 MED ORDER — SCOPOLAMINE 1 MG/3DAYS TD PT72: 1.0000 | MEDICATED_PATCH | Freq: Once | TRANSDERMAL | Status: DC | PRN

## 2016-01-25 MED ORDER — PROPOFOL 10 MG/ML IV BOLUS
INTRAVENOUS | Status: DC | PRN
  Administered 2016-01-25: 10 mg via INTRAVENOUS
  Administered 2016-01-25: 20 mg via INTRAVENOUS
  Administered 2016-01-25 (×2): 10 mg via INTRAVENOUS

## 2016-01-25 MED ORDER — PROMETHAZINE HCL 25 MG/ML IJ SOLN: 6.2500 mg | INTRAMUSCULAR | Status: DC | PRN

## 2016-01-25 MED ORDER — MIDAZOLAM HCL 2 MG/2ML IJ SOLN: 1.0000 mg | INTRAMUSCULAR | Status: DC | PRN

## 2016-01-25 MED ORDER — GLYCOPYRROLATE 0.2 MG/ML IJ SOLN: 0.2000 mg | Freq: Once | INTRAMUSCULAR | Status: DC | PRN

## 2016-01-25 MED ORDER — VANCOMYCIN HCL IN DEXTROSE 1-5 GM/200ML-% IV SOLN
INTRAVENOUS | Status: AC
  Filled 2016-01-25: qty 200

## 2016-01-25 MED ORDER — ONDANSETRON HCL 4 MG/2ML IJ SOLN
INTRAMUSCULAR | Status: DC | PRN
  Administered 2016-01-25: 4 mg via INTRAVENOUS

## 2016-01-25 MED ORDER — HYDROMORPHONE HCL 1 MG/ML IJ SOLN: 0.2500 mg | INTRAMUSCULAR | Status: DC | PRN

## 2016-01-25 MED ORDER — BUPIVACAINE HCL (PF) 0.25 % IJ SOLN
INTRAMUSCULAR | Status: DC | PRN
  Administered 2016-01-25: 8 mL

## 2016-01-25 MED ORDER — LACTATED RINGERS IV SOLN
INTRAVENOUS | Status: DC
  Administered 2016-01-25: 10:00:00 via INTRAVENOUS

## 2016-01-25 MED ORDER — FENTANYL CITRATE (PF) 100 MCG/2ML IJ SOLN
50.0000 ug | INTRAMUSCULAR | Status: DC | PRN
  Administered 2016-01-25 (×2): 25 ug via INTRAVENOUS

## 2016-01-25 MED ORDER — FENTANYL CITRATE (PF) 100 MCG/2ML IJ SOLN
INTRAMUSCULAR | Status: AC
  Filled 2016-01-25: qty 2

## 2016-01-25 MED ORDER — TRAMADOL HCL 50 MG PO TABS: 50.0000 mg | ORAL_TABLET | Freq: Four times a day (QID) | ORAL | Status: DC | PRN

## 2016-01-25 MED ORDER — CHLORHEXIDINE GLUCONATE 4 % EX LIQD: 60.0000 mL | Freq: Once | CUTANEOUS | Status: DC

## 2016-01-25 SURGICAL SUPPLY — 35 items
BANDAGE COBAN STERILE 2 (GAUZE/BANDAGES/DRESSINGS) ×3 IMPLANT
BLADE SURG 15 STRL LF DISP TIS (BLADE) ×1 IMPLANT
BLADE SURG 15 STRL SS (BLADE) ×2
BNDG ESMARK 4X9 LF (GAUZE/BANDAGES/DRESSINGS) IMPLANT
CHLORAPREP W/TINT 26ML (MISCELLANEOUS) ×3 IMPLANT
CORDS BIPOLAR (ELECTRODE) IMPLANT
COVER BACK TABLE 60X90IN (DRAPES) ×3 IMPLANT
COVER MAYO STAND STRL (DRAPES) ×3 IMPLANT
CUFF TOURNIQUET SINGLE 18IN (TOURNIQUET CUFF) ×3 IMPLANT
DECANTER SPIKE VIAL GLASS SM (MISCELLANEOUS) IMPLANT
DRAPE EXTREMITY T 121X128X90 (DRAPE) ×3 IMPLANT
DRAPE SURG 17X23 STRL (DRAPES) ×3 IMPLANT
GAUZE SPONGE 4X4 12PLY STRL (GAUZE/BANDAGES/DRESSINGS) ×3 IMPLANT
GAUZE XEROFORM 1X8 LF (GAUZE/BANDAGES/DRESSINGS) ×3 IMPLANT
GLOVE BIO SURGEON STRL SZ7.5 (GLOVE) ×3 IMPLANT
GLOVE BIOGEL PI IND STRL 7.0 (GLOVE) ×2 IMPLANT
GLOVE BIOGEL PI IND STRL 7.5 (GLOVE) ×1 IMPLANT
GLOVE BIOGEL PI IND STRL 8.5 (GLOVE) ×1 IMPLANT
GLOVE BIOGEL PI INDICATOR 7.0 (GLOVE) ×4
GLOVE BIOGEL PI INDICATOR 7.5 (GLOVE) ×2
GLOVE BIOGEL PI INDICATOR 8.5 (GLOVE) ×2
GLOVE ECLIPSE 6.5 STRL STRAW (GLOVE) ×3 IMPLANT
GLOVE SURG ORTHO 8.0 STRL STRW (GLOVE) ×3 IMPLANT
GOWN STRL REUS W/ TWL LRG LVL3 (GOWN DISPOSABLE) ×1 IMPLANT
GOWN STRL REUS W/TWL LRG LVL3 (GOWN DISPOSABLE) ×2
GOWN STRL REUS W/TWL XL LVL3 (GOWN DISPOSABLE) ×3 IMPLANT
NEEDLE PRECISIONGLIDE 27X1.5 (NEEDLE) ×3 IMPLANT
NS IRRIG 1000ML POUR BTL (IV SOLUTION) ×3 IMPLANT
PACK BASIN DAY SURGERY FS (CUSTOM PROCEDURE TRAY) ×3 IMPLANT
STOCKINETTE 4X48 STRL (DRAPES) ×3 IMPLANT
SUT ETHILON 4 0 PS 2 18 (SUTURE) ×6 IMPLANT
SYR BULB 3OZ (MISCELLANEOUS) ×3 IMPLANT
SYR CONTROL 10ML LL (SYRINGE) ×3 IMPLANT
TOWEL OR 17X24 6PK STRL BLUE (TOWEL DISPOSABLE) ×6 IMPLANT
UNDERPAD 30X30 (UNDERPADS AND DIAPERS) ×3 IMPLANT

## 2016-01-25 NOTE — Anesthesia Procedure Notes (Signed)
Procedure Name: MAC Date/Time: 01/25/2016 10:13 AM Performed by: Baxter Flattery Pre-anesthesia Checklist: Patient identified, Emergency Drugs available, Suction available and Patient being monitored Patient Re-evaluated:Patient Re-evaluated prior to inductionOxygen Delivery Method: Simple face mask Preoxygenation: Pre-oxygenation with 100% oxygen Intubation Type: IV induction Ventilation: Mask ventilation without difficulty Dental Injury: Teeth and Oropharynx as per pre-operative assessment

## 2016-01-25 NOTE — H&P (Signed)
[name] is an 82 y.o. female.   Chief Complaint:catching ring and small fingers left hand HPI: [name] is a 82 yo female who has [place] arthritis. She has triggering of her left ring finger. These have not settled down for her. She has tried multiple injections, splinting to the ring finger but she states now the small finger is beginning to bother her in addition. She has no new history of injury. She complains of stabbing pain, worse at the metacarpophalangeal joint, sharp in nature. Frequently having to pull her finger up. She has developed a slight flexion deformity to the PIP joint of the ring finger being unable to fully move it with the catching. This has been chronic in nature, increasing in intensity and she is desirous of having something done.  Past Medical History  Diagnosis Date  . Arthritis  . Arthritis of carpometacarpal joint  Bilateral  . Breast cancer ([place]) 2003  Left  . Carpal tunnel syndrome on left  . Diabetes ([place])  . Diabetes mellitus type II, controlled ([place])  . Disorder of thyroid  . High cholesterol  . Hypertension  . Left carpal tunnel syndrome  . Peptic ulcer disease  . Stroke ([place]) 11/2013  mild  . Tinnitus 1980's   Past Surgical History  Procedure Laterality Date  . Thyroid surgery Right 1980's  . Appendectomy  . Ovarian cyst removal 1950  . Tonsillectomy  . Total abdominal hysterectomy 1970  . Carpal tunnel release Left 05/24/2014  . Cataract extraction w/ intraocular lens implant, bilateral 2005  . Other surgical history  Tumor resection  . Breast surgery    Past Medical History  Diagnosis Date  . Hypertension   . Diabetes mellitus without complication ([place])   . Arthritis   . GERD (gastroesophageal reflux disease)   . Wears glasses   . Stroke ([place])     2014  . Hypothyroidism   . Cancer [facility])     breast cancer on left - 10  years ago    Past Surgical History  Procedure Laterality Date  . Tonsillectomy    . Appendectomy    .  Abdominal hysterectomy    . Ovarian cyst surgery      x2-  . Debridement of abdominal wall abscess  1978  . Carpal tunnel release      right  . Bunionectomy      right  . Arcuate keratectomy    . Carpal tunnel release Left 05/24/2014    Procedure: LEFT CARPAL TUNNEL RELEASE LEFT THUMB TRIGGER FINGER RELEASE;  Surgeon: Cammie Sickle, MD;  Location: [facility];  Service: Orthopedics;  Laterality: Left;  left thumb also site of incision    History reviewed. No pertinent family history. Social History:  reports that she has quit smoking. Her smoking use included Cigarettes. She has never used smokeless tobacco. She reports that she drinks alcohol. She reports that she does not use illicit drugs.  Allergies:  Allergies  Allergen Reactions  . Morphine And Related Nausea And Vomiting  . Penicillins Swelling  . Tetracyclines & Related Swelling    Face and eyes swells    Medications Prior to Admission  Medication Sig Dispense Refill  . metFORMIN (GLUCOPHAGE-XR) 750 MG 24 hr tablet Take 750 mg by mouth daily with breakfast.    . Ascorbic Acid (VITAMIN C ER PO) Take by mouth daily.    . Coenzyme Q10 (CO Q-10) 100 MG CAPS Take by mouth.    Marland Kitchen  felodipine (PLENDIL) 5 MG 24 hr tablet Take 5 mg by mouth daily.    . Fish Oil OIL by Does not apply route daily.    Marland Kitchen levothyroxine (SYNTHROID, LEVOTHROID) 50 MCG tablet Take 50 mcg by mouth daily before breakfast.    . losartan (COZAAR) 100 MG tablet Take 100 mg by mouth daily.    . Multiple Vitamins-Minerals (MULTIVITAMIN WITH MINERALS) tablet Take 1 tablet by mouth daily.    Marland Kitchen omeprazole (PRILOSEC) 20 MG capsule Take 20 mg by mouth daily.    . Red Yeast Rice Extract (RED YEAST RICE PO) Take by mouth daily.    Marland Kitchen senna-docusate (SENOKOT-S) 8.6-50 MG per tablet Take 1 tablet by mouth 2 (two) times daily. 30 tablet 0  . UNABLE TO FIND Magnilife pain relieving foot cream,as needed    . vitamin B-12 (CYANOCOBALAMIN) 250 MCG tablet Take  250 mcg by mouth daily.      No results found for this or any previous visit (from the past 48 hour(s)).  No results found.   Pertinent items are noted in HPI.  Height 4\' 11"  (1.499 m), weight 65.318 kg (144 lb).  General appearance: alert, cooperative and appears stated age Head: Normocephalic, without obvious abnormality Neck: no JVD Resp: clear to auscultation bilaterally Cardio: regular rate and rhythm, S1, S2 normal, no murmur, click, rub or gallop GI: soft, non-tender; bowel sounds normal; no masses,  no organomegaly Extremities: catching ring and small left Pulses: 2+ and symmetric Skin: Skin color, texture, turgor normal. No rashes or lesions Neurologic: Grossly normal Incision/Wound: na  Assessment/Plan Assessment:  1. Trigger ring finger of left hand  2. Trigger little finger of left hand    Plan: PLAN: We recommend release of the A1 pulley of the left small and ring fingers done simultaneously. She is aware that there is no guarantee with the surgery, possibility of infection, recurrence, injury to arteries, nerves, tendons, incomplete relief of dystrophy. She would like to proceed to have this done. Questions are encouraged and answered to her satisfaction. This will be scheduled as an outpatient under regional anesthesia for release A1 pulley, left ring and left small fingers.   [name] R 01/25/2016, 9:35 AM

## 2016-01-25 NOTE — Discharge Instructions (Addendum)

## 2016-01-25 NOTE — Anesthesia Postprocedure Evaluation (Signed)
Anesthesia Post Note  Patient: [name]  Procedure(s) Performed: Procedure(s) (LRB): RELEASE A-1 PULLEY LEFT RING FINGER, LEFT SMALL FINGER (Left)  Patient location during evaluation: PACU Anesthesia Type: MAC Level of consciousness: awake and alert Pain management: pain level controlled Vital Signs Assessment: post-procedure vital signs reviewed and stable Respiratory status: spontaneous breathing and respiratory function stable Cardiovascular status: stable Anesthetic complications: no    Last Vitals:  Filed Vitals:   01/25/16 1115 01/25/16 1130  BP: 149/58 131/77  Pulse: 52 56  Temp:    Resp: 16 15    Last Pain: There were no vitals filed for this visit.               Jenavive Lamboy DANIEL

## 2016-01-25 NOTE — Op Note (Signed)
Dictation Number [id number]

## 2016-01-25 NOTE — Brief Op Note (Signed)
01/25/2016  10:39 AM  PATIENT:  Royston Bake  82 y.o. female  PRE-OPERATIVE DIAGNOSIS:  STENOSING TENOSYNOVITIS LEFT RING FINGER, LEFT SMALL FINGER  POST-OPERATIVE DIAGNOSIS:  STENOSING TENOSYNOVITIS LEFT RING FINGER, LEFT SMALL FINGER  PROCEDURE:  Procedure(s) with comments: RELEASE A-1 PULLEY LEFT RING FINGER, LEFT SMALL FINGER (Left) - ANESTHESIA: IV REGIONAL FAB  SURGEON:  Surgeon(s) and Role:    * Daryll Brod, MD - Primary  PHYSICIAN ASSISTANT:   ASSISTANTS: none   ANESTHESIA:   local and regional  EBL:  Total I/O In: 300 [I.V.:300] Out: -   BLOOD ADMINISTERED:none  DRAINS: none   LOCAL MEDICATIONS USED:  BUPIVICAINE   SPECIMEN:  No Specimen  DISPOSITION OF SPECIMEN:  N/A  COUNTS:  YES  TOURNIQUET:   Total Tourniquet Time Documented: Forearm (Left) - 18 minutes Total: Forearm (Left) - 18 minutes   DICTATION: .Other Dictation: Dictation Number [id number]  PLAN OF CARE: Discharge to home after PACU  PATIENT DISPOSITION:  PACU - hemodynamically stable.

## 2016-01-25 NOTE — Anesthesia Preprocedure Evaluation (Addendum)
Anesthesia Evaluation  Patient identified by MRN, date of birth, ID band Patient awake    Reviewed: Allergy & Precautions, H&P , NPO status , Patient's Chart, lab work & pertinent test results  Airway Mallampati: I  TM Distance: >3 FB Neck ROM: Full    Dental no notable dental hx. (+) Teeth Intact, Dental Advisory Given   Pulmonary former smoker,    Pulmonary exam normal breath sounds clear to auscultation       Cardiovascular hypertension, On Medications  Rhythm:Regular Rate:Normal     Neuro/Psych CVA negative psych ROS   GI/Hepatic Neg liver ROS, GERD  Medicated and Controlled,  Endo/Other  diabetes, Type 2, Oral Hypoglycemic AgentsHypothyroidism   Renal/GU negative Renal ROS  negative genitourinary   Musculoskeletal   Abdominal   Peds  Hematology negative hematology ROS (+)   Anesthesia Other Findings   Reproductive/Obstetrics negative OB ROS                           Anesthesia Physical  Anesthesia Plan  ASA: III  Anesthesia Plan: MAC and Bier Block   Post-op Pain Management:    Induction:   Airway Management Planned: Simple Face Mask  Additional Equipment:   Intra-op Plan:   Post-operative Plan:   Informed Consent: I have reviewed the patients History and Physical, chart, labs and discussed the procedure including the risks, benefits and alternatives for the proposed anesthesia with the patient or authorized representative who has indicated his/her understanding and acceptance.   Dental advisory given  Plan Discussed with: CRNA, Anesthesiologist and Surgeon  Anesthesia Plan Comments:       Anesthesia Quick Evaluation

## 2016-01-25 NOTE — Op Note (Signed)
[name], [name] NO.:  [id number]  MEDICAL RECORD NO.:  [id number]  LOCATION:                                 FACILITY:  PHYSICIAN:  Daryll Brod, M.D.       DATE OF BIRTH:  18-Feb-1934  DATE OF PROCEDURE:  01/25/2016 DATE OF DISCHARGE:                              OPERATIVE REPORT   PREOPERATIVE DIAGNOSIS:  Stenosing tenosynovitis, left ring and left small fingers.  POSTOPERATIVE DIAGNOSIS:  Stenosing tenosynovitis, left ring and left small fingers.  OPERATION:  Release of A1 pulley, left ring and left small fingers.  SURGEON:  Daryll Brod, M.D.  ASSISTANT:  None.  ANESTHESIA:  Forearm-based IV regional with local infiltration.  ANESTHESIOLOGIST:  Nelda Severe. Tobias Alexander, M.D.  PLACE OF SURGERY:  [facility].  HISTORY:  The patient is a 82 year old female with a history of triggering of her left ring and left small finger.  This has not responded to conservative treatment.  She has elected to undergo surgical decompression to the A1 pulley of both left ring and small fingers.  Pre, peri, and postoperative course have been discussed along with risks and complications.  She is aware that there is no guarantee with the surgery; the possibility of infection; recurrence of injury to arteries, nerves, tendons; incomplete relief of symptoms; dystrophy.  In the preoperative area, the patient is seen, the extremity marked by both patient and surgeon and antibiotic given.  PROCEDURE IN DETAIL:  The patient was brought to the operating room, where a forearm-based IV regional anesthetic was carried out without difficulty.  She was prepped using ChloraPrep, supine position with the left arm free.  A 3-minute dry time was allowed.  Time-out taken, confirming the patient and procedure.  An oblique incision was made over the A1 pulley of left ring finger, carried down through the subcutaneous tissue.  Bleeders were electrocauterized with bipolar.  The tissue  was bluntly dissected down to the A1 pulley, flexor sheath.  Retractor was placed protecting neurovascular bundles radially and ulnarly.  The A1 pulley was released on its radial aspect.  A small incision was made centrally in A2.  Partial tenosynovectomy performed proximally with separation of the two flexor tendons.  The finger was passively placed through a full range of motion, no further triggering was noted. Separate incision was then made to the small finger and obliquely again, carried down through the subcutaneous tissue.  The dissection carried down to the A1 pulley.  Retractor was placed protecting the neurovascular bundles.  The A1 pulley was released on its radial aspect. Again, a small incision made centrally in A2.  A partial tenosynovectomy performed proximally with separation of the two flexor tendons.  The wounds were each irrigated with saline.  The finger was placed through a full passive range of motion and no further triggering was noted on either finger.  The wound was then closed with interrupted 4-0 nylon sutures.  A local infiltration with 0.25% bupivacaine without epinephrine was given, approximately 7 mL was used.  A sterile compressive dressing with the fingers free was applied.  On deflation of the tourniquet, all fingers were pinked, she was  taken to the recovery room for observation in satisfactory condition.  She will be discharged to home to return to the [facility] in 1 week, on tramadol.          ______________________________ Daryll Brod, M.D.     GK/MEDQ  D:  01/25/2016  T:  01/25/2016  Job:  [id number]

## 2016-01-25 NOTE — Transfer of Care (Signed)
Immediate Anesthesia Transfer of Care Note  Patient: [name]  Procedure(s) Performed: Procedure(s) with comments: RELEASE A-1 PULLEY LEFT RING FINGER, LEFT SMALL FINGER (Left) - ANESTHESIA: IV REGIONAL FAB  Patient Location: PACU  Anesthesia Type:MAC and Bier block  Level of Consciousness: awake, alert  and oriented  Airway & Oxygen Therapy: Patient Spontanous Breathing and Patient connected to face mask oxygen  Post-op Assessment: Report given to RN, Post -op Vital signs reviewed and stable and Patient moving all extremities  Post vital signs: Reviewed and stable  Last Vitals:  Filed Vitals:   01/25/16 0944  BP: 178/54  Pulse: 51  Temp: 36.6 C  Resp: 16    Complications: No apparent anesthesia complications

## 2016-01-26 ENCOUNTER — Encounter (HOSPITAL_BASED_OUTPATIENT_CLINIC_OR_DEPARTMENT_OTHER): Payer: Self-pay | Admitting: Orthopedic Surgery

## 2016-02-05 ENCOUNTER — Other Ambulatory Visit: Payer: Self-pay | Admitting: Geriatric Medicine

## 2016-02-05 DIAGNOSIS — Z853 Personal history of malignant neoplasm of breast: Secondary | ICD-10-CM

## 2016-03-04 DIAGNOSIS — M25572 Pain in left ankle and joints of left foot: Secondary | ICD-10-CM | POA: Diagnosis not present

## 2016-03-04 DIAGNOSIS — M79672 Pain in left foot: Secondary | ICD-10-CM | POA: Diagnosis not present

## 2016-03-19 ENCOUNTER — Ambulatory Visit
Admission: RE | Admit: 2016-03-19 | Discharge: 2016-03-19 | Disposition: A | Discharge status: Home / Self Care | Payer: Medicare Other | Source: Ambulatory Visit | Attending: Geriatric Medicine | Admitting: Geriatric Medicine

## 2016-03-19 DIAGNOSIS — Z853 Personal history of malignant neoplasm of breast: Secondary | ICD-10-CM

## 2016-03-19 DIAGNOSIS — R928 Other abnormal and inconclusive findings on diagnostic imaging of breast: Secondary | ICD-10-CM | POA: Diagnosis not present

## 2016-03-22 DIAGNOSIS — D2261 Melanocytic nevi of right upper limb, including shoulder: Secondary | ICD-10-CM | POA: Diagnosis not present

## 2016-03-22 DIAGNOSIS — L821 Other seborrheic keratosis: Secondary | ICD-10-CM | POA: Diagnosis not present

## 2016-03-22 DIAGNOSIS — L720 Epidermal cyst: Secondary | ICD-10-CM | POA: Diagnosis not present

## 2016-03-22 DIAGNOSIS — L738 Other specified follicular disorders: Secondary | ICD-10-CM | POA: Diagnosis not present

## 2016-03-22 DIAGNOSIS — L400 Psoriasis vulgaris: Secondary | ICD-10-CM | POA: Diagnosis not present

## 2016-03-22 DIAGNOSIS — I788 Other diseases of capillaries: Secondary | ICD-10-CM | POA: Diagnosis not present

## 2016-03-22 DIAGNOSIS — D225 Melanocytic nevi of trunk: Secondary | ICD-10-CM | POA: Diagnosis not present

## 2016-04-02 ENCOUNTER — Ambulatory Visit (INDEPENDENT_AMBULATORY_CARE_PROVIDER_SITE_OTHER): Payer: Medicare Other | Admitting: Podiatry

## 2016-04-02 ENCOUNTER — Encounter: Payer: Self-pay | Admitting: Podiatry

## 2016-04-02 ENCOUNTER — Ambulatory Visit (INDEPENDENT_AMBULATORY_CARE_PROVIDER_SITE_OTHER): Payer: Medicare Other

## 2016-04-02 VITALS — BP 155/66 | HR 55 | Temp 96.7°F | Resp 12

## 2016-04-02 DIAGNOSIS — M79672 Pain in left foot: Secondary | ICD-10-CM

## 2016-04-02 DIAGNOSIS — M722 Plantar fascial fibromatosis: Secondary | ICD-10-CM | POA: Diagnosis not present

## 2016-04-02 DIAGNOSIS — M7662 Achilles tendinitis, left leg: Secondary | ICD-10-CM | POA: Diagnosis not present

## 2016-04-02 NOTE — Patient Instructions (Signed)
Achilles Tendinitis Achilles tendinitis is inflammation of the tough, cord-like band that attaches the lower muscles of your leg to your heel (Achilles tendon). It is usually caused by overusing the tendon and joint involved.  CAUSES Achilles tendinitis can happen because of:  A sudden increase in exercise or activity (such as running).  Doing the same exercises or activities (such as jumping) over and over.  Not warming up calf muscles before exercising.  Exercising in shoes that are worn out or not made for exercise.  Having arthritis or a bone growth on the back of the heel bone. This can rub against the tendon and hurt the tendon. SIGNS AND SYMPTOMS The most common symptoms are:  Pain in the back of the leg, just above the heel. The pain usually gets worse with exercise and better with rest.  Stiffness or soreness in the back of the leg, especially in the morning.  Swelling of the skin over the Achilles tendon.  Trouble standing on tiptoe. Sometimes, an Achilles tendon tears (ruptures). Symptoms of an Achilles tendon rupture can include:  Sudden, severe pain in the back of the leg.  Trouble putting weight on the foot or walking normally. DIAGNOSIS Achilles tendinitis will be diagnosed based on symptoms and a physical examination. An X-ray may be done to check if another condition is causing your symptoms. An MRI may be ordered if your health care provider suspects you may have completely torn your tendon, which is called an Achilles tendon rupture.  TREATMENT  Achilles tendinitis usually gets better over time. It can take weeks to months to heal completely. Treatment focuses on treating the symptoms and helping the injury heal. HOME CARE INSTRUCTIONS   Rest your Achilles tendon and avoid activities that cause pain.  Apply ice to the injured area:  Put ice in a plastic bag.  Place a towel between your skin and the bag.  Leave the ice on for 20 minutes, 2-3 times a  day  Try to avoid using the tendon (other than gentle range of motion) while the tendon is painful. Do not resume use until instructed by your health care provider. Then begin use gradually. Do not increase use to the point of pain. If pain does develop, decrease use and continue the above measures. Gradually increase activities that do not cause discomfort until you achieve normal use.  Do exercises to make your calf muscles stronger and more flexible. Your health care provider or physical therapist can recommend exercises for you to do.  Wrap your ankle with an elastic bandage or other wrap. This can help keep your tendon from moving too much. Your health care provider will show you how to wrap your ankle correctly.  Only take over-the-counter or prescription medicines for pain, discomfort, or fever as directed by your health care provider. SEEK MEDICAL CARE IF:   Your pain and swelling increase or pain is uncontrolled with medicines.  You develop new, unexplained symptoms or your symptoms get worse.  You are unable to move your toes or foot.  You develop warmth and swelling in your foot.  You have an unexplained temperature. MAKE SURE YOU:   Understand these instructions.  Will watch your condition.  Will get help right away if you are not doing well or get worse.   This information is not intended to replace advice given to you by your health care provider. Make sure you discuss any questions you have with your health care provider.   Document Released:   08/21/2005 Document Revised: 12/02/2014 Document Reviewed: 06/23/2013 Elsevier Interactive Patient Education 2016 [employer] Plantar Fasciitis Plantar fasciitis is a painful foot condition that affects the heel. It occurs when the band of tissue that connects the toes to the heel bone (plantar fascia) becomes irritated. This can happen after exercising too much or doing other repetitive activities (overuse injury). The pain  from plantar fasciitis can range from mild irritation to severe pain that makes it difficult for you to walk or move. The pain is usually worse in the morning or after you have been sitting or lying down for a while. CAUSES This condition may be caused by:  Standing for long periods of time.  Wearing shoes that do not fit.  Doing high-impact activities, including running, aerobics, and ballet.  Being overweight.  Having an abnormal way of walking (gait).  Having tight calf muscles.  Having high arches in your feet.  Starting a new athletic activity. SYMPTOMS The main symptom of this condition is heel pain. Other symptoms include:  Pain that gets worse after activity or exercise.  Pain that is worse in the morning or after resting.  Pain that goes away after you walk for a few minutes. DIAGNOSIS This condition may be diagnosed based on your signs and symptoms. Your health care provider will also do a physical exam to check for:  A tender area on the bottom of your foot.  A high arch in your foot.  Pain when you move your foot.  Difficulty moving your foot. You may also need to have imaging studies to confirm the diagnosis. These can include:  X-rays.  Ultrasound.  MRI. TREATMENT  Treatment for plantar fasciitis depends on the severity of the condition. Your treatment may include:  Rest, ice, and over-the-counter pain medicines to manage your pain.  Exercises to stretch your calves and your plantar fascia.  A splint that holds your foot in a stretched, upward position while you sleep (night splint).  Physical therapy to relieve symptoms and prevent problems in the future.  Cortisone injections to relieve severe pain.  Extracorporeal shock wave therapy (ESWT) to stimulate damaged plantar fascia with electrical impulses. It is often used as a last resort before surgery.  Surgery, if other treatments have not worked after 12 months. HOME CARE  INSTRUCTIONS  Take medicines only as directed by your health care provider.  Avoid activities that cause pain.  Roll the bottom of your foot over a bag of ice or a bottle of cold water. Do this for 20 minutes, 3-4 times a day.  Perform simple stretches as directed by your health care provider.  Try wearing athletic shoes with air-sole or gel-sole cushions or soft shoe inserts.  Wear a night splint while sleeping, if directed by your health care provider.  Keep all follow-up appointments with your health care provider. PREVENTION   Do not perform exercises or activities that cause heel pain.  Consider finding low-impact activities if you continue to have problems.  Lose weight if you need to. The best way to prevent plantar fasciitis is to avoid the activities that aggravate your plantar fascia. SEEK MEDICAL CARE IF:  Your symptoms do not go away after treatment with home care measures.  Your pain gets worse.  Your pain affects your ability to move or do your daily activities.   This information is not intended to replace advice given to you by your health care provider. Make sure you discuss any questions you have with your  health care provider.   Document Released: 08/06/2001 Document Revised: 08/02/2015 Document Reviewed: 09/21/2014 Elsevier Interactive Patient Education [employer].

## 2016-04-02 NOTE — Progress Notes (Signed)
   Subjective:    Patient ID: [name], female    DOB: 1934-01-14, 82 y.o.   MRN: [id number]  HPI   This patient presents today stating that approximately 2 months ago she heard a popping sensation in her left foot ankle area. After that patient described pain in or around her heel area and ankle area which is gradually improved over time. Patient says that she is able to take care of her husband arising at 5 AM and by 12 noon complains of left heel and ankle pain. The symptoms gradually worsened from that time during the day until patient sits down and rests the foot ankle area. She is soaking her foot in warm Epsom salts water and does stretches which does relieve some of the heel pain Patient had a history of plantar fasciitis left on the visit of 10/10/2015 and at that time was given a Kenalog Injection into the inferior left heel which she said resolved the heel pain temporarily  Review of Systems  All other systems reviewed and are negative.      Objective:   Physical Exam  Patient appears orientated 3 with husband present in the treatment room  Vascular: No peripheral edema noted bilaterally DP and PT pulses 2/4 bilaterally Capillary reflex immediate bilaterally  Neurological: Sensation to 10 g monofilament wire intact 5/5 bilaterally Vibratory sensation reactive bilaterally Ankle reflex equal and reactive bilaterally  Dermatological: No open skin lesions bilaterally There is no erythema, edema, ecchymosis noted in the left foot  Musculoskeletal: Palpable tenderness tendo Achilles left without any palpable lesions Palpable tenderness medial plantar fascial insertional left without a palpable lesions Manual motor testing: Dorsi flexion, plantar flexion, inversion, eversion 5/5 bilaterally  X-ray examination weightbearing left foot dated 04/02/2016  Intact bony structures without fracture and/or dislocation Posterior and inferior calcaneal spurs Hallux  interphalangeus  Radiographic impression: No acute bony abnormality noted left foot dated 04/02/2016    Assessment & Plan:   Assessment: No acute bony abnormality noted left foot Achilles tendinitis left Plantar fasciitis left  Plan: Today I reviewed the results of x-ray examination with patient today. A fasciitis strap was dispensed for patient aware over left foot and ankle Maintain stretching Encourage patient to attempt to reduce amount of standing walking throughout the day  Reappoint at patient's request

## 2016-04-08 ENCOUNTER — Other Ambulatory Visit (HOSPITAL_COMMUNITY): Payer: Self-pay | Admitting: Family

## 2016-04-17 ENCOUNTER — Encounter: Payer: Self-pay | Admitting: Podiatry

## 2016-04-17 ENCOUNTER — Ambulatory Visit (INDEPENDENT_AMBULATORY_CARE_PROVIDER_SITE_OTHER): Payer: Medicare Other

## 2016-04-17 ENCOUNTER — Ambulatory Visit (INDEPENDENT_AMBULATORY_CARE_PROVIDER_SITE_OTHER): Payer: Medicare Other | Admitting: Podiatry

## 2016-04-17 VITALS — BP 105/70 | HR 70 | Resp 12

## 2016-04-17 DIAGNOSIS — M79672 Pain in left foot: Secondary | ICD-10-CM

## 2016-04-17 DIAGNOSIS — M8430XA Stress fracture, unspecified site, initial encounter for fracture: Secondary | ICD-10-CM | POA: Diagnosis not present

## 2016-04-17 NOTE — Progress Notes (Signed)
   Subjective:    Patient ID: [name], female    DOB: 1934-10-05, 82 y.o.   MRN: [id number]  HPI This patient presents today with a five-day history of acute onset of a swollen painful left foot without any history of direct injury or change in activity. The left foot is uncomfortable when standing walking with direct pressure and patient has reduced standing walking to accommodate to this area. Patient also has a recent history of left plantar fasciitis which is improving Patient is diabetic and denies any history of skin ulceration, claudication or amputation  Review of Systems  Cardiovascular: Positive for leg swelling.  Musculoskeletal: Positive for gait problem.       Objective:   Physical Exam  Orientated 3 BP 105/70 Pulse 70 Respiration 12  Vascular:  no calf pain or calf edema bilaterally Low-grade nonpitting edema dorsal left foot DP pulses 2/4 bilaterally PT pulses 2/4 bilaterally Capillary reflex immediate bilaterally  Neurological: Sensation to 10 g monofilament wire intact 5/5 bilaterally Vibratory sensation reactive bilaterally Ankle reflex equal and reactive bilaterally  Dermatological: No open skin lesions bilaterally Low-grade edema dorsal second to fourth MPJ left without erythema or warmth  Musculoskeletal: HAV left Hammertoe second left Palpable tenderness without any palpable lesions insertional plantar fascial left Palpable tenderness to dorsal pressure second MPJ left without any palpable lesions. There is no tenderness on range of motion second MPJ left Palpable tenderness along the second MPJ and shaft the second metatarsal without palpable lesions Palpable tenderness in the third left metatarsal shaft without palpable lesions   X-ray examination weightbearing left foot dated 04/17/2016  Intact bony structure without fracture and/or dislocation HAV Hallux interphalangeus Hammertoe second Posterior and inferior calcaneal spurs No  increased soft tissue density   Radiographic impression: No acute bony abnormality noted in weightbearing x-ray dated 04/17/2016     Assessment & Plan:   Assessment: Type II diabetic Satisfactory neurovascular status Plantar fasciitis left Bone stress reaction second-third metatarsal left foot Cannot rule out pre-dislocation syndrome or pending Charcot's disease  Plan: Today I reviewed the results of exam an x-ray with patient today. I made aware that I was suspicious of possible stress fracture or bone stress reaction at this time. I advised patient to limit her standing and walking and dispensed a surgical shoe to wear on the left foot.  Reappoint 3 weeks for progress x-ray left foot or sooner if patient has concern

## 2016-04-17 NOTE — Patient Instructions (Signed)
Today your examination revealed local swelling and tenderness around the top of the second toe area on the left foot without history of direct injury I am unsure the exact cause of this problem. Initially treatment will include the possibility of a bone stress reaction, inflamed joint A surgical shoe was dispensed to wear in the left foot and limits her standing and walking Continue wearing the fasciitis strap on the left foot If you develop any significant increase in pain, swelling, redness, warmth contact office or present to the emergency department    Diabetes and Foot Care Diabetes may cause you to have problems because of poor blood supply (circulation) to your feet and legs. This may cause the skin on your feet to become thinner, break easier, and heal more slowly. Your skin may become dry, and the skin may peel and crack. You may also have nerve damage in your legs and feet causing decreased feeling in them. You may not notice minor injuries to your feet that could lead to infections or more serious problems. Taking care of your feet is one of the most important things you can do for yourself.  HOME CARE INSTRUCTIONS  Wear shoes at all times, even in the house. Do not go barefoot. Bare feet are easily injured.  Check your feet daily for blisters, cuts, and redness. If you cannot see the bottom of your feet, use a mirror or ask someone for help.  Wash your feet with warm water (do not use hot water) and mild soap. Then pat your feet and the areas between your toes until they are completely dry. Do not soak your feet as this can dry your skin.  Apply a moisturizing lotion or petroleum jelly (that does not contain alcohol and is unscented) to the skin on your feet and to dry, brittle toenails. Do not apply lotion between your toes.  Trim your toenails straight across. Do not dig under them or around the cuticle. File the edges of your nails with an emery board or nail file.  Do not cut  corns or calluses or try to remove them with medicine.  Wear clean socks or stockings every day. Make sure they are not too tight. Do not wear knee-high stockings since they may decrease blood flow to your legs.  Wear shoes that fit properly and have enough cushioning. To break in new shoes, wear them for just a few hours a day. This prevents you from injuring your feet. Always look in your shoes before you put them on to be sure there are no objects inside.  Do not cross your legs. This may decrease the blood flow to your feet.  If you find a minor scrape, cut, or break in the skin on your feet, keep it and the skin around it clean and dry. These areas may be cleansed with mild soap and water. Do not cleanse the area with peroxide, alcohol, or iodine.  When you remove an adhesive bandage, be sure not to damage the skin around it.  If you have a wound, look at it several times a day to make sure it is healing.  Do not use heating pads or hot water bottles. They may burn your skin. If you have lost feeling in your feet or legs, you may not know it is happening until it is too late.  Make sure your health care provider performs a complete foot exam at least annually or more often if you have foot problems. Report  any cuts, sores, or bruises to your health care provider immediately. SEEK MEDICAL CARE IF:   You have an injury that is not healing.  You have cuts or breaks in the skin.  You have an ingrown nail.  You notice redness on your legs or feet.  You feel burning or tingling in your legs or feet.  You have pain or cramps in your legs and feet.  Your legs or feet are numb.  Your feet always feel cold. SEEK IMMEDIATE MEDICAL CARE IF:   There is increasing redness, swelling, or pain in or around a wound.  There is a red line that goes up your leg.  Pus is coming from a wound.  You develop a fever or as directed by your health care provider.  You notice a bad smell coming  from an ulcer or wound.   This information is not intended to replace advice given to you by your health care provider. Make sure you discuss any questions you have with your health care provider.   Document Released: 11/08/2000 Document Revised: 07/14/2013 Document Reviewed: 04/20/2013 Elsevier Interactive Patient Education [employer].

## 2016-04-18 ENCOUNTER — Encounter: Payer: Self-pay | Admitting: Podiatry

## 2016-04-18 ENCOUNTER — Ambulatory Visit (INDEPENDENT_AMBULATORY_CARE_PROVIDER_SITE_OTHER): Payer: Medicare Other | Admitting: Podiatry

## 2016-04-18 DIAGNOSIS — M8430XA Stress fracture, unspecified site, initial encounter for fracture: Secondary | ICD-10-CM | POA: Diagnosis not present

## 2016-04-18 DIAGNOSIS — I82402 Acute embolism and thrombosis of unspecified deep veins of left lower extremity: Secondary | ICD-10-CM | POA: Diagnosis not present

## 2016-04-18 NOTE — Progress Notes (Signed)
Subjective:     Patient ID: [name], female   DOB: Apr 02, 1934, 82 y.o.   MRN: [id number]  HPI patient states that the swelling has magnified over the last day and is now into the left ankle and she was concerned and wanted to have her foot checked again. She had seen another physician yesterday who diagnosed her with probable stress fracture of the third metatarsal   Review of Systems     Objective:   Physical Exam Neurovascular status unchanged with patient found to have +1 and +2 pitting edema in the left forefoot and ankle. I did note today a positive Homans sign which she states yesterday was not present when the doctor checked her. The pain is still most intense in the left forefoot but again she has had quite a bit of change in the edema over the last 24 hours    Assessment:     Possibility for a blood clot of the left lower leg versus stress fracture    Plan:     Reviewed condition with patient and at this time we scheduled her for an Doppler to rule out a clot. She is scheduled for tomorrow at 3:30 and will be treated depending on response

## 2016-04-19 ENCOUNTER — Telehealth: Payer: Self-pay | Admitting: *Deleted

## 2016-04-19 ENCOUNTER — Ambulatory Visit (HOSPITAL_COMMUNITY)
Admission: RE | Admit: 2016-04-19 | Discharge: 2016-04-19 | Disposition: A | Discharge status: Home / Self Care | Payer: Medicare Other | Source: Ambulatory Visit | Attending: Cardiovascular Disease | Admitting: Cardiovascular Disease

## 2016-04-19 DIAGNOSIS — E119 Type 2 diabetes mellitus without complications: Secondary | ICD-10-CM | POA: Insufficient documentation

## 2016-04-19 DIAGNOSIS — I10 Essential (primary) hypertension: Secondary | ICD-10-CM | POA: Diagnosis not present

## 2016-04-19 DIAGNOSIS — I82402 Acute embolism and thrombosis of unspecified deep veins of left lower extremity: Secondary | ICD-10-CM | POA: Diagnosis not present

## 2016-04-19 DIAGNOSIS — K219 Gastro-esophageal reflux disease without esophagitis: Secondary | ICD-10-CM | POA: Insufficient documentation

## 2016-04-19 DIAGNOSIS — R600 Localized edema: Secondary | ICD-10-CM | POA: Diagnosis present

## 2016-04-19 NOTE — Telephone Encounter (Signed)
[name] states completed venous doppler on pt and it is negative for DVT.  Dr. Paulla Dolly instructed pt to go home rest.

## 2016-05-07 ENCOUNTER — Ambulatory Visit (INDEPENDENT_AMBULATORY_CARE_PROVIDER_SITE_OTHER): Payer: Medicare Other

## 2016-05-07 ENCOUNTER — Ambulatory Visit (INDEPENDENT_AMBULATORY_CARE_PROVIDER_SITE_OTHER): Payer: Medicare Other | Admitting: Podiatry

## 2016-05-07 ENCOUNTER — Encounter: Payer: Self-pay | Admitting: Podiatry

## 2016-05-07 VITALS — BP 132/64 | HR 54 | Resp 12

## 2016-05-07 DIAGNOSIS — M79672 Pain in left foot: Secondary | ICD-10-CM | POA: Diagnosis not present

## 2016-05-07 DIAGNOSIS — M8430XG Stress fracture, unspecified site, subsequent encounter for fracture with delayed healing: Secondary | ICD-10-CM | POA: Diagnosis not present

## 2016-05-07 NOTE — Patient Instructions (Signed)
Today year follow-up x-ray was negative for fracture. Your symptoms are most consistent with a bone stress reaction, a bruise-like bone as well as an irritated joint in around the second toe area. I recommended you continue wearing the surgical shoe on a continuous basis for at least another 3 weeks. Return as needed

## 2016-05-07 NOTE — Progress Notes (Signed)
   Subjective:    Patient ID: [name], female    DOB: [birth year], 82 y.o.   MRN: [id number]  HPI This patient presents for follow-up visit of 04/17/2016 or possible bone stress reaction second-third metatarsal left foot. Patient is been wearing surgical shoe up to the last week, however, discontinued wearing the shoe because she did not feel it reduced her symptoms. She still is complaining of pain in the dorsal second MPJ area Patient did present to office on 8/25 and was evaluated by Dr. Paulla Dolly who ordered a venous Doppler to rule out any DVT. The venous Doppler 04/19/2016 the left lower extremity demonstrate no evidence of lower extremity deep or superficial venous thrombosis or incompetence   Review of Systems  Musculoskeletal: Positive for gait problem.       Objective:   Physical Exam  Physical Exam  Orientated 3 BP 105/70 Pulse 70 Respiration 12  Vascular: no calf pain or calf edema bilaterally Low-grade nonpitting edema dorsal left foot DP pulses 2/4 bilaterally PT pulses 2/4 bilaterally Capillary reflex immediate bilaterally  Neurological: Sensation to 10 g monofilament wire intact 5/5 bilaterally Vibratory sensation reactive bilaterally Ankle reflex equal and reactive bilaterally  Dermatological: No open skin lesions bilaterally Low-grade edema dorsal second to fourth MPJ left without erythema or warmth  Musculoskeletal: HAV left Hammertoe second left There is no palpable tenderness in the insertional fascial area left Palpable tenderness to dorsal pressure second MPJ left without any palpable lesions. There is no tenderness on range of motion second MPJ left Palpable tenderness along the second MPJ and shaft the second metatarsal without palpable lesions  X-ray examination weightbearing left foot dated 05/07/2016  Intact bony structures fracture and/or dislocation HAV Hallux interphalangeus Hammertoe second Posterior and inferior calcaneal  spur No increased soft tissue density No change from baseline x-ray 04/17/2016  Radiographic impression: No acute bony abnormality noted in weightbearing x-ray dated 05/07/2016           Assessment & Plan:   Assessment: Bone stress reaction second metatarsal left foot Hammertoe second MPJ with capsulitis second MPJ Previously noted venous Doppler negative for DVT  Plan: Today reviewed the results of the exam and x-ray with patient today. I made aware that the pain in the second metatarsal seem to be most consistent with a bone stress reaction and I recommended she continue wearing the surgical shoe an ongoing basis until the symptoms resolve. Also, because patients fasciitis seems to resolve she can discontinue the fasciitis strap  Reappoint at patient's request

## 2016-06-04 DIAGNOSIS — Z7984 Long term (current) use of oral hypoglycemic drugs: Secondary | ICD-10-CM | POA: Diagnosis not present

## 2016-06-04 DIAGNOSIS — N183 Chronic kidney disease, stage 3 (moderate): Secondary | ICD-10-CM | POA: Diagnosis not present

## 2016-06-04 DIAGNOSIS — E11319 Type 2 diabetes mellitus with unspecified diabetic retinopathy without macular edema: Secondary | ICD-10-CM | POA: Diagnosis not present

## 2016-06-04 DIAGNOSIS — I129 Hypertensive chronic kidney disease with stage 1 through stage 4 chronic kidney disease, or unspecified chronic kidney disease: Secondary | ICD-10-CM | POA: Diagnosis not present

## 2016-06-04 DIAGNOSIS — F321 Major depressive disorder, single episode, moderate: Secondary | ICD-10-CM | POA: Diagnosis not present

## 2016-06-04 DIAGNOSIS — E1121 Type 2 diabetes mellitus with diabetic nephropathy: Secondary | ICD-10-CM | POA: Diagnosis not present

## 2016-06-25 DIAGNOSIS — L821 Other seborrheic keratosis: Secondary | ICD-10-CM | POA: Diagnosis not present

## 2016-06-25 DIAGNOSIS — D225 Melanocytic nevi of trunk: Secondary | ICD-10-CM | POA: Diagnosis not present

## 2016-06-25 DIAGNOSIS — L400 Psoriasis vulgaris: Secondary | ICD-10-CM | POA: Diagnosis not present

## 2016-06-25 DIAGNOSIS — L738 Other specified follicular disorders: Secondary | ICD-10-CM | POA: Diagnosis not present

## 2016-06-25 DIAGNOSIS — L298 Other pruritus: Secondary | ICD-10-CM | POA: Diagnosis not present

## 2016-07-01 DIAGNOSIS — E11319 Type 2 diabetes mellitus with unspecified diabetic retinopathy without macular edema: Secondary | ICD-10-CM | POA: Diagnosis not present

## 2016-07-01 DIAGNOSIS — N183 Chronic kidney disease, stage 3 (moderate): Secondary | ICD-10-CM | POA: Diagnosis not present

## 2016-07-01 DIAGNOSIS — F321 Major depressive disorder, single episode, moderate: Secondary | ICD-10-CM | POA: Diagnosis not present

## 2016-07-01 DIAGNOSIS — I129 Hypertensive chronic kidney disease with stage 1 through stage 4 chronic kidney disease, or unspecified chronic kidney disease: Secondary | ICD-10-CM | POA: Diagnosis not present

## 2016-07-01 DIAGNOSIS — Z7984 Long term (current) use of oral hypoglycemic drugs: Secondary | ICD-10-CM | POA: Diagnosis not present

## 2016-07-01 DIAGNOSIS — E1121 Type 2 diabetes mellitus with diabetic nephropathy: Secondary | ICD-10-CM | POA: Diagnosis not present

## 2016-07-01 DIAGNOSIS — Z79899 Other long term (current) drug therapy: Secondary | ICD-10-CM | POA: Diagnosis not present

## 2016-07-01 DIAGNOSIS — R110 Nausea: Secondary | ICD-10-CM | POA: Diagnosis not present

## 2016-08-12 DIAGNOSIS — I129 Hypertensive chronic kidney disease with stage 1 through stage 4 chronic kidney disease, or unspecified chronic kidney disease: Secondary | ICD-10-CM | POA: Diagnosis not present

## 2016-08-12 DIAGNOSIS — N183 Chronic kidney disease, stage 3 (moderate): Secondary | ICD-10-CM | POA: Diagnosis not present

## 2016-08-12 DIAGNOSIS — E1121 Type 2 diabetes mellitus with diabetic nephropathy: Secondary | ICD-10-CM | POA: Diagnosis not present

## 2016-08-12 DIAGNOSIS — I6930 Unspecified sequelae of cerebral infarction: Secondary | ICD-10-CM | POA: Diagnosis not present

## 2016-08-12 DIAGNOSIS — E11319 Type 2 diabetes mellitus with unspecified diabetic retinopathy without macular edema: Secondary | ICD-10-CM | POA: Diagnosis not present

## 2016-08-12 DIAGNOSIS — Z7984 Long term (current) use of oral hypoglycemic drugs: Secondary | ICD-10-CM | POA: Diagnosis not present

## 2016-08-28 ENCOUNTER — Other Ambulatory Visit: Payer: Self-pay | Admitting: Geriatric Medicine

## 2016-08-28 DIAGNOSIS — Z7984 Long term (current) use of oral hypoglycemic drugs: Secondary | ICD-10-CM | POA: Diagnosis not present

## 2016-08-28 DIAGNOSIS — F419 Anxiety disorder, unspecified: Secondary | ICD-10-CM | POA: Diagnosis not present

## 2016-08-28 DIAGNOSIS — E11319 Type 2 diabetes mellitus with unspecified diabetic retinopathy without macular edema: Secondary | ICD-10-CM | POA: Diagnosis not present

## 2016-08-28 DIAGNOSIS — N183 Chronic kidney disease, stage 3 (moderate): Secondary | ICD-10-CM | POA: Diagnosis not present

## 2016-08-28 DIAGNOSIS — R110 Nausea: Secondary | ICD-10-CM | POA: Diagnosis not present

## 2016-08-28 DIAGNOSIS — E1121 Type 2 diabetes mellitus with diabetic nephropathy: Secondary | ICD-10-CM | POA: Diagnosis not present

## 2016-09-11 ENCOUNTER — Ambulatory Visit
Admission: RE | Admit: 2016-09-11 | Discharge: 2016-09-11 | Disposition: A | Discharge status: Home / Self Care | Payer: Medicare Other | Source: Ambulatory Visit | Attending: Geriatric Medicine | Admitting: Geriatric Medicine

## 2016-09-11 DIAGNOSIS — R110 Nausea: Secondary | ICD-10-CM

## 2016-09-12 ENCOUNTER — Other Ambulatory Visit: Payer: Self-pay | Admitting: Geriatric Medicine

## 2016-09-12 DIAGNOSIS — R935 Abnormal findings on diagnostic imaging of other abdominal regions, including retroperitoneum: Secondary | ICD-10-CM

## 2016-09-13 ENCOUNTER — Other Ambulatory Visit: Payer: Self-pay

## 2016-09-13 DIAGNOSIS — Z1231 Encounter for screening mammogram for malignant neoplasm of breast: Secondary | ICD-10-CM

## 2016-09-17 ENCOUNTER — Ambulatory Visit
Admission: RE | Admit: 2016-09-17 | Discharge: 2016-09-17 | Disposition: A | Discharge status: Home / Self Care | Payer: Medicare Other | Source: Ambulatory Visit | Attending: Geriatric Medicine | Admitting: Geriatric Medicine

## 2016-09-17 DIAGNOSIS — R935 Abnormal findings on diagnostic imaging of other abdominal regions, including retroperitoneum: Secondary | ICD-10-CM

## 2016-09-17 DIAGNOSIS — R110 Nausea: Secondary | ICD-10-CM | POA: Diagnosis not present

## 2016-09-17 MED ORDER — GADOBENATE DIMEGLUMINE 529 MG/ML IV SOLN
11.0000 mL | Freq: Once | INTRAVENOUS | Status: AC | PRN
  Administered 2016-09-17: 11 mL via INTRAVENOUS

## 2016-09-18 ENCOUNTER — Other Ambulatory Visit: Payer: Self-pay | Admitting: Geriatric Medicine

## 2016-09-18 DIAGNOSIS — N6320 Unspecified lump in the left breast, unspecified quadrant: Secondary | ICD-10-CM | POA: Diagnosis not present

## 2016-09-18 DIAGNOSIS — I129 Hypertensive chronic kidney disease with stage 1 through stage 4 chronic kidney disease, or unspecified chronic kidney disease: Secondary | ICD-10-CM | POA: Diagnosis not present

## 2016-09-18 DIAGNOSIS — N183 Chronic kidney disease, stage 3 (moderate): Secondary | ICD-10-CM | POA: Diagnosis not present

## 2016-09-20 ENCOUNTER — Ambulatory Visit
Admission: RE | Admit: 2016-09-20 | Discharge: 2016-09-20 | Disposition: A | Discharge status: Home / Self Care | Payer: Medicare Other | Source: Ambulatory Visit | Attending: Geriatric Medicine | Admitting: Geriatric Medicine

## 2016-09-20 DIAGNOSIS — R928 Other abnormal and inconclusive findings on diagnostic imaging of breast: Secondary | ICD-10-CM | POA: Diagnosis not present

## 2016-09-20 DIAGNOSIS — N6320 Unspecified lump in the left breast, unspecified quadrant: Secondary | ICD-10-CM

## 2016-09-20 DIAGNOSIS — N6489 Other specified disorders of breast: Secondary | ICD-10-CM | POA: Diagnosis not present

## 2016-09-24 DIAGNOSIS — E1121 Type 2 diabetes mellitus with diabetic nephropathy: Secondary | ICD-10-CM | POA: Diagnosis not present

## 2016-09-24 DIAGNOSIS — R110 Nausea: Secondary | ICD-10-CM | POA: Diagnosis not present

## 2016-09-24 DIAGNOSIS — Z7984 Long term (current) use of oral hypoglycemic drugs: Secondary | ICD-10-CM | POA: Diagnosis not present

## 2016-09-28 DIAGNOSIS — R110 Nausea: Secondary | ICD-10-CM | POA: Diagnosis not present

## 2016-09-28 DIAGNOSIS — K5900 Constipation, unspecified: Secondary | ICD-10-CM | POA: Diagnosis not present

## 2016-10-01 DIAGNOSIS — Z0000 Encounter for general adult medical examination without abnormal findings: Secondary | ICD-10-CM | POA: Diagnosis not present

## 2016-10-01 DIAGNOSIS — N183 Chronic kidney disease, stage 3 (moderate): Secondary | ICD-10-CM | POA: Diagnosis not present

## 2016-10-01 DIAGNOSIS — Z114 Encounter for screening for human immunodeficiency virus [HIV]: Secondary | ICD-10-CM | POA: Diagnosis not present

## 2016-10-01 DIAGNOSIS — E1121 Type 2 diabetes mellitus with diabetic nephropathy: Secondary | ICD-10-CM | POA: Diagnosis not present

## 2016-10-01 DIAGNOSIS — R110 Nausea: Secondary | ICD-10-CM | POA: Diagnosis not present

## 2016-10-01 DIAGNOSIS — E11319 Type 2 diabetes mellitus with unspecified diabetic retinopathy without macular edema: Secondary | ICD-10-CM | POA: Diagnosis not present

## 2016-10-01 DIAGNOSIS — I129 Hypertensive chronic kidney disease with stage 1 through stage 4 chronic kidney disease, or unspecified chronic kidney disease: Secondary | ICD-10-CM | POA: Diagnosis not present

## 2016-10-02 ENCOUNTER — Other Ambulatory Visit (HOSPITAL_COMMUNITY): Payer: Self-pay | Admitting: Gastroenterology

## 2016-10-02 DIAGNOSIS — Z1211 Encounter for screening for malignant neoplasm of colon: Secondary | ICD-10-CM | POA: Diagnosis not present

## 2016-10-02 DIAGNOSIS — R110 Nausea: Secondary | ICD-10-CM

## 2016-10-02 DIAGNOSIS — R6881 Early satiety: Secondary | ICD-10-CM | POA: Diagnosis not present

## 2016-10-02 DIAGNOSIS — K219 Gastro-esophageal reflux disease without esophagitis: Secondary | ICD-10-CM | POA: Diagnosis not present

## 2016-10-02 DIAGNOSIS — K449 Diaphragmatic hernia without obstruction or gangrene: Secondary | ICD-10-CM

## 2016-10-05 DIAGNOSIS — R110 Nausea: Secondary | ICD-10-CM | POA: Diagnosis not present

## 2016-10-06 ENCOUNTER — Encounter (HOSPITAL_COMMUNITY): Payer: Self-pay | Admitting: *Deleted

## 2016-10-06 ENCOUNTER — Emergency Department (HOSPITAL_COMMUNITY): Payer: Medicare Other

## 2016-10-06 ENCOUNTER — Inpatient Hospital Stay (HOSPITAL_COMMUNITY)
Admission: EM | Admit: 2016-10-06 | Discharge: 2016-10-09 | DRG: 312 | Disposition: A | Discharge status: Rehabilitation Facility | Payer: Medicare Other | Attending: Internal Medicine | Admitting: Internal Medicine

## 2016-10-06 DIAGNOSIS — T426X5A Adverse effect of other antiepileptic and sedative-hypnotic drugs, initial encounter: Secondary | ICD-10-CM | POA: Diagnosis present

## 2016-10-06 DIAGNOSIS — S064X1A Epidural hemorrhage with loss of consciousness of 30 minutes or less, initial encounter: Secondary | ICD-10-CM | POA: Diagnosis not present

## 2016-10-06 DIAGNOSIS — R4182 Altered mental status, unspecified: Secondary | ICD-10-CM | POA: Diagnosis not present

## 2016-10-06 DIAGNOSIS — I63412 Cerebral infarction due to embolism of left middle cerebral artery: Secondary | ICD-10-CM

## 2016-10-06 DIAGNOSIS — R6881 Early satiety: Secondary | ICD-10-CM

## 2016-10-06 DIAGNOSIS — G459 Transient cerebral ischemic attack, unspecified: Secondary | ICD-10-CM

## 2016-10-06 DIAGNOSIS — K219 Gastro-esophageal reflux disease without esophagitis: Secondary | ICD-10-CM | POA: Diagnosis not present

## 2016-10-06 DIAGNOSIS — W1830XA Fall on same level, unspecified, initial encounter: Secondary | ICD-10-CM | POA: Diagnosis present

## 2016-10-06 DIAGNOSIS — R55 Syncope and collapse: Secondary | ICD-10-CM | POA: Diagnosis not present

## 2016-10-06 DIAGNOSIS — M6282 Rhabdomyolysis: Secondary | ICD-10-CM | POA: Diagnosis present

## 2016-10-06 DIAGNOSIS — Z87891 Personal history of nicotine dependence: Secondary | ICD-10-CM | POA: Diagnosis not present

## 2016-10-06 DIAGNOSIS — D72829 Elevated white blood cell count, unspecified: Secondary | ICD-10-CM | POA: Diagnosis not present

## 2016-10-06 DIAGNOSIS — I10 Essential (primary) hypertension: Secondary | ICD-10-CM | POA: Diagnosis present

## 2016-10-06 DIAGNOSIS — R739 Hyperglycemia, unspecified: Secondary | ICD-10-CM

## 2016-10-06 DIAGNOSIS — E1151 Type 2 diabetes mellitus with diabetic peripheral angiopathy without gangrene: Secondary | ICD-10-CM | POA: Diagnosis not present

## 2016-10-06 DIAGNOSIS — E039 Hypothyroidism, unspecified: Secondary | ICD-10-CM | POA: Diagnosis present

## 2016-10-06 DIAGNOSIS — R197 Diarrhea, unspecified: Secondary | ICD-10-CM | POA: Diagnosis present

## 2016-10-06 DIAGNOSIS — D62 Acute posthemorrhagic anemia: Secondary | ICD-10-CM

## 2016-10-06 DIAGNOSIS — N182 Chronic kidney disease, stage 2 (mild): Secondary | ICD-10-CM

## 2016-10-06 DIAGNOSIS — S0012XA Contusion of left eyelid and periocular area, initial encounter: Secondary | ICD-10-CM | POA: Diagnosis present

## 2016-10-06 DIAGNOSIS — I69320 Aphasia following cerebral infarction: Secondary | ICD-10-CM

## 2016-10-06 DIAGNOSIS — Z853 Personal history of malignant neoplasm of breast: Secondary | ICD-10-CM

## 2016-10-06 DIAGNOSIS — R4701 Aphasia: Secondary | ICD-10-CM | POA: Diagnosis not present

## 2016-10-06 DIAGNOSIS — E118 Type 2 diabetes mellitus with unspecified complications: Secondary | ICD-10-CM

## 2016-10-06 DIAGNOSIS — K5900 Constipation, unspecified: Secondary | ICD-10-CM | POA: Diagnosis present

## 2016-10-06 DIAGNOSIS — S5002XA Contusion of left elbow, initial encounter: Secondary | ICD-10-CM | POA: Diagnosis present

## 2016-10-06 DIAGNOSIS — S098XXA Other specified injuries of head, initial encounter: Secondary | ICD-10-CM | POA: Diagnosis not present

## 2016-10-06 DIAGNOSIS — E785 Hyperlipidemia, unspecified: Secondary | ICD-10-CM | POA: Diagnosis present

## 2016-10-06 DIAGNOSIS — I639 Cerebral infarction, unspecified: Secondary | ICD-10-CM

## 2016-10-06 DIAGNOSIS — Z8673 Personal history of transient ischemic attack (TIA), and cerebral infarction without residual deficits: Secondary | ICD-10-CM

## 2016-10-06 DIAGNOSIS — S5001XA Contusion of right elbow, initial encounter: Secondary | ICD-10-CM | POA: Diagnosis present

## 2016-10-06 DIAGNOSIS — R109 Unspecified abdominal pain: Secondary | ICD-10-CM

## 2016-10-06 DIAGNOSIS — S199XXA Unspecified injury of neck, initial encounter: Secondary | ICD-10-CM | POA: Diagnosis not present

## 2016-10-06 DIAGNOSIS — F411 Generalized anxiety disorder: Secondary | ICD-10-CM

## 2016-10-06 LAB — GLUCOSE, CAPILLARY: GLUCOSE-CAPILLARY: 129 mg/dL — AB (ref 65–99)

## 2016-10-06 LAB — CBC
HCT: 38.8 % (ref 36.0–46.0)
Hemoglobin: 13.5 g/dL (ref 12.0–15.0)
MCH: 32.7 pg (ref 26.0–34.0)
MCHC: 34.8 g/dL (ref 30.0–36.0)
MCV: 93.9 fL (ref 78.0–100.0)
PLATELETS: 267 10*3/uL (ref 150–400)
RBC: 4.13 MIL/uL (ref 3.87–5.11)
RDW: 12.3 % (ref 11.5–15.5)
WBC: 16.4 10*3/uL — AB (ref 4.0–10.5)

## 2016-10-06 LAB — COMPREHENSIVE METABOLIC PANEL
ALBUMIN: 4.3 g/dL (ref 3.5–5.0)
ALK PHOS: 71 U/L (ref 38–126)
ALT: 21 U/L (ref 14–54)
AST: 50 U/L — ABNORMAL HIGH (ref 15–41)
Anion gap: 13 (ref 5–15)
BUN: 19 mg/dL (ref 6–20)
CALCIUM: 9.8 mg/dL (ref 8.9–10.3)
CO2: 26 mmol/L (ref 22–32)
CREATININE: 1.08 mg/dL — AB (ref 0.44–1.00)
Chloride: 98 mmol/L — ABNORMAL LOW (ref 101–111)
GFR calc Af Amer: 55 mL/min — ABNORMAL LOW (ref >60)
GFR calc non Af Amer: 47 mL/min — ABNORMAL LOW (ref >60)
GLUCOSE: 120 mg/dL — AB (ref 65–99)
Potassium: 3.8 mmol/L (ref 3.5–5.1)
SODIUM: 137 mmol/L (ref 135–145)
Total Bilirubin: 1 mg/dL (ref 0.3–1.2)
Total Protein: 7.3 g/dL (ref 6.5–8.1)

## 2016-10-06 LAB — RAPID URINE DRUG SCREEN, HOSP PERFORMED
AMPHETAMINES: NOT DETECTED
BENZODIAZEPINES: POSITIVE — AB
Barbiturates: NOT DETECTED
Cocaine: NOT DETECTED
OPIATES: NOT DETECTED
TETRAHYDROCANNABINOL: NOT DETECTED

## 2016-10-06 LAB — DIFFERENTIAL
BASOS ABS: 0 10*3/uL (ref 0.0–0.1)
Basophils Relative: 0 %
Eosinophils Absolute: 0 10*3/uL (ref 0.0–0.7)
Eosinophils Relative: 0 %
LYMPHS PCT: 7 %
Lymphs Abs: 1.2 10*3/uL (ref 0.7–4.0)
Monocytes Absolute: 1.6 10*3/uL — ABNORMAL HIGH (ref 0.1–1.0)
Monocytes Relative: 10 %
NEUTROS ABS: 13.5 10*3/uL — AB (ref 1.7–7.7)
NEUTROS PCT: 83 %

## 2016-10-06 LAB — URINE MICROSCOPIC-ADD ON

## 2016-10-06 LAB — I-STAT CHEM 8, ED
BUN: 20 mg/dL (ref 6–20)
CALCIUM ION: 1.19 mmol/L (ref 1.15–1.40)
CHLORIDE: 100 mmol/L — AB (ref 101–111)
Creatinine, Ser: 1.1 mg/dL — ABNORMAL HIGH (ref 0.44–1.00)
Glucose, Bld: 112 mg/dL — ABNORMAL HIGH (ref 65–99)
HCT: 39 % (ref 36.0–46.0)
Hemoglobin: 13.3 g/dL (ref 12.0–15.0)
Potassium: 3.8 mmol/L (ref 3.5–5.1)
SODIUM: 138 mmol/L (ref 135–145)
TCO2: 27 mmol/L (ref 0–100)

## 2016-10-06 LAB — URINALYSIS, ROUTINE W REFLEX MICROSCOPIC
BILIRUBIN URINE: NEGATIVE
Glucose, UA: >1000 mg/dL — AB
KETONES UR: 15 mg/dL — AB
Leukocytes, UA: NEGATIVE
NITRITE: NEGATIVE
PH: 6 (ref 5.0–8.0)
Protein, ur: 100 mg/dL — AB
Specific Gravity, Urine: 1.015 (ref 1.005–1.030)

## 2016-10-06 LAB — APTT: aPTT: 29 seconds (ref 24–36)

## 2016-10-06 LAB — CK: Total CK: 2341 U/L — ABNORMAL HIGH (ref 38–234)

## 2016-10-06 LAB — ETHANOL: Alcohol, Ethyl (B): <5 mg/dL (ref <5)

## 2016-10-06 LAB — I-STAT TROPONIN, ED: Troponin i, poc: 0 ng/mL (ref 0.00–0.08)

## 2016-10-06 LAB — PROTIME-INR
INR: 0.96
PROTHROMBIN TIME: 12.8 s (ref 11.4–15.2)

## 2016-10-06 MED ORDER — LORAZEPAM 2 MG/ML IJ SOLN
0.5000 mg | Freq: Once | INTRAMUSCULAR | Status: AC
Start: 2016-10-06 — End: 2016-10-06
  Administered 2016-10-06: 0.5 mg via INTRAVENOUS
  Filled 2016-10-06: qty 1

## 2016-10-06 MED ORDER — ALPRAZOLAM 0.25 MG PO TABS
0.2500 mg | ORAL_TABLET | Freq: Three times a day (TID) | ORAL | Status: DC | PRN
  Administered 2016-10-09: 0.25 mg via ORAL
  Filled 2016-10-06 (×2): qty 1

## 2016-10-06 MED ORDER — FELODIPINE ER 5 MG PO TB24
5.0000 mg | ORAL_TABLET | Freq: Every day | ORAL | Status: DC
  Administered 2016-10-07 – 2016-10-09 (×3): 5 mg via ORAL
  Filled 2016-10-06 (×4): qty 1

## 2016-10-06 MED ORDER — ASPIRIN 325 MG PO TABS
325.0000 mg | ORAL_TABLET | Freq: Every day | ORAL | Status: DC
  Administered 2016-10-07 – 2016-10-09 (×3): 325 mg via ORAL
  Filled 2016-10-06 (×3): qty 1

## 2016-10-06 MED ORDER — SODIUM CHLORIDE 0.9 % IV SOLN
INTRAVENOUS | Status: DC
  Administered 2016-10-06: 22:00:00 via INTRAVENOUS

## 2016-10-06 MED ORDER — SENNOSIDES-DOCUSATE SODIUM 8.6-50 MG PO TABS: 1.0000 | ORAL_TABLET | Freq: Every evening | ORAL | Status: DC | PRN

## 2016-10-06 MED ORDER — LEVOTHYROXINE SODIUM 50 MCG PO TABS
50.0000 ug | ORAL_TABLET | Freq: Every day | ORAL | Status: DC
  Administered 2016-10-08 – 2016-10-09 (×2): 50 ug via ORAL
  Filled 2016-10-06 (×2): qty 1

## 2016-10-06 MED ORDER — PANTOPRAZOLE SODIUM 40 MG PO TBEC
40.0000 mg | DELAYED_RELEASE_TABLET | Freq: Every day | ORAL | Status: DC
  Administered 2016-10-07 – 2016-10-09 (×3): 40 mg via ORAL
  Filled 2016-10-06 (×3): qty 1

## 2016-10-06 MED ORDER — IPRATROPIUM-ALBUTEROL 0.5-2.5 (3) MG/3ML IN SOLN: 3.0000 mL | RESPIRATORY_TRACT | Status: DC | PRN

## 2016-10-06 MED ORDER — LOSARTAN POTASSIUM 50 MG PO TABS
100.0000 mg | ORAL_TABLET | Freq: Every day | ORAL | Status: DC
  Administered 2016-10-07 – 2016-10-09 (×2): 100 mg via ORAL
  Filled 2016-10-06 (×3): qty 2

## 2016-10-06 MED ORDER — STROKE: EARLY STAGES OF RECOVERY BOOK
Freq: Once | Status: AC
  Administered 2016-10-06: 1
  Filled 2016-10-06: qty 1

## 2016-10-06 MED ORDER — INSULIN ASPART 100 UNIT/ML ~~LOC~~ SOLN
0.0000 [IU] | Freq: Three times a day (TID) | SUBCUTANEOUS | Status: DC
  Administered 2016-10-07: 1 [IU] via SUBCUTANEOUS
  Administered 2016-10-08 (×2): 2 [IU] via SUBCUTANEOUS
  Administered 2016-10-09: 1 [IU] via SUBCUTANEOUS

## 2016-10-06 MED ORDER — ATORVASTATIN CALCIUM 40 MG PO TABS
40.0000 mg | ORAL_TABLET | Freq: Every day | ORAL | Status: DC
  Administered 2016-10-08: 40 mg via ORAL
  Filled 2016-10-06 (×2): qty 1

## 2016-10-06 MED ORDER — SIMVASTATIN 20 MG PO TABS: 20.0000 mg | ORAL_TABLET | Freq: Every day | ORAL | Status: DC

## 2016-10-06 MED ORDER — SODIUM CHLORIDE 0.9 % IV BOLUS (SEPSIS)
1000.0000 mL | Freq: Once | INTRAVENOUS | Status: AC
  Administered 2016-10-06: 1000 mL via INTRAVENOUS

## 2016-10-06 MED ORDER — ONDANSETRON HCL 4 MG/2ML IJ SOLN
4.0000 mg | Freq: Four times a day (QID) | INTRAMUSCULAR | Status: DC | PRN
  Filled 2016-10-06: qty 2

## 2016-10-06 MED ORDER — ENOXAPARIN SODIUM 40 MG/0.4ML ~~LOC~~ SOLN
40.0000 mg | SUBCUTANEOUS | Status: DC
  Administered 2016-10-06 – 2016-10-08 (×3): 40 mg via SUBCUTANEOUS
  Filled 2016-10-06 (×3): qty 0.4

## 2016-10-06 NOTE — ED Notes (Signed)
Pt came for AMS and fall, CT shows new infarct, pt hard to arouse due to receiving ativan for CT,does not follow commands, failed swallow screen, NIH 6 due to unable to assess and follow commands.

## 2016-10-06 NOTE — ED Provider Notes (Signed)
[facility] DEPT Provider Note   CSN: [id number] Arrival date & time: 10/06/16  1511     History   Chief Complaint Chief Complaint  Patient presents with  . Fall  . Altered Mental Status    HPI [name] is a 82 y.o. female.  Patient presents by EMS after she was found by her family confused and on the ground this morning. She was last seen normal yesterday at noon by her sister. She normally lives alone and independently and has no history of dementia. She is unable to provide additional history given her mental status. Per EMS she was moving all extremities and had unremarkable vital signs, with a blood sugar of 120 en route. Per the patient's sister she has been dealing with episodes of nausea/vomiting the last several months, worsened over the past few weeks. Was seen at [facility] yesterday and prescribed Phenergan which she did take. She is not on anticoagulation. History of a stroke several years ago and is in remission from breast cancer for the last 7 years.   The history is provided by the EMS personnel and a relative. No language interpreter was used.  Fall  This is a new problem. Episode onset: Unknown, presumed in the last 24 hours. The problem occurs constantly. The problem has not changed since onset.Associated symptoms comments: Unable to specify but denies pain. Nothing aggravates the symptoms. Nothing relieves the symptoms. She has tried nothing for the symptoms. The treatment provided no relief.    Past Medical History:  Diagnosis Date  . Arthritis   . Cancer [facility])    breast cancer on left - 10  years ago  . Diabetes mellitus without complication ([place])   . GERD (gastroesophageal reflux disease)   . Hypertension   . Hypothyroidism   . Stroke ([place])    2014  . Wears glasses     Patient Active Problem List   Diagnosis Date Noted  . Stroke ([place]) 10/06/2016  . Stroke (cerebrum) ([place]) 10/06/2016  . Aphasia 10/06/2016  . Leukocytosis 10/06/2016   . Essential hypertension 10/06/2016  . Hypothyroidism 10/06/2016  . GERD (gastroesophageal reflux disease) 10/06/2016  . Hyperlipemia 10/06/2016    Past Surgical History:  Procedure Laterality Date  . ABDOMINAL HYSTERECTOMY    . APPENDECTOMY    . ARCUATE KERATECTOMY    . BUNIONECTOMY     right  . CARPAL TUNNEL RELEASE     right  . CARPAL TUNNEL RELEASE Left 05/24/2014   Procedure: LEFT CARPAL TUNNEL RELEASE LEFT THUMB TRIGGER FINGER RELEASE;  Surgeon: Cammie Sickle, MD;  Location: [facility];  Service: Orthopedics;  Laterality: Left;  left thumb also site of incision  . DEBRIDEMENT OF ABDOMINAL WALL ABSCESS  1978  . OVARIAN CYST SURGERY     x2-  . TONSILLECTOMY    . TRIGGER FINGER RELEASE Left 01/25/2016   Procedure: RELEASE A-1 PULLEY LEFT RING FINGER, LEFT SMALL FINGER;  Surgeon: Daryll Brod, MD;  Location: [facility];  Service: Orthopedics;  Laterality: Left;  ANESTHESIA: IV REGIONAL FAB    OB History    No data available       Home Medications    Prior to Admission medications   Medication Sig Start Date End Date Taking? Authorizing Provider  ALPRAZolam (XANAX) 0.25 MG tablet Take 0.25 mg by mouth 3 (three) times daily.   Yes Historical Provider, MD  felodipine (PLENDIL) 5 MG 24 hr tablet Take 5 mg by mouth  daily.   Yes Historical Provider, MD  levothyroxine (SYNTHROID, LEVOTHROID) 50 MCG tablet Take 50 mcg by mouth daily before breakfast.   Yes Historical Provider, MD  losartan (COZAAR) 100 MG tablet Take 100 mg by mouth daily.   Yes Historical Provider, MD  metFORMIN (GLUCOPHAGE-XR) 750 MG 24 hr tablet Take 750 mg by mouth daily with breakfast.   Yes Historical Provider, MD  ondansetron (ZOFRAN) 8 MG tablet Take 8 mg by mouth every 8 (eight) hours as needed for nausea.   Yes Historical Provider, MD  promethazine (PHENERGAN) 25 MG tablet Take 25 mg by mouth every 6 (six) hours as needed for nausea or vomiting.   Yes Historical Provider,  MD  Ascorbic Acid (VITAMIN C ER PO) Take by mouth daily.    Historical Provider, MD  Coenzyme Q10 (CO Q-10) 100 MG CAPS Take by mouth.    Historical Provider, MD  Fish Oil OIL by Does not apply route daily.    Historical Provider, MD  Multiple Vitamins-Minerals (MULTIVITAMIN WITH MINERALS) tablet Take 1 tablet by mouth daily.    Historical Provider, MD  omeprazole (PRILOSEC) 20 MG capsule Take 20 mg by mouth daily.    Historical Provider, MD  Red Yeast Rice Extract (RED YEAST RICE PO) Take by mouth daily.    Historical Provider, MD  senna-docusate (SENOKOT-S) 8.6-50 MG per tablet Take 1 tablet by mouth 2 (two) times daily. Patient not taking: Reported on 10/06/2016 05/27/14   Pattricia Boss, MD  simvastatin (ZOCOR) 20 MG tablet Take 20 mg by mouth daily.    Historical Provider, MD  traMADol (ULTRAM) 50 MG tablet Take 1 tablet (50 mg total) by mouth every 6 (six) hours as needed. 01/25/16   Daryll Brod, MD  UNABLE TO FIND Magnilife pain relieving foot cream,as needed    Historical Provider, MD  vitamin B-12 (CYANOCOBALAMIN) 250 MCG tablet Take 250 mcg by mouth daily.    Historical Provider, MD    Family History History reviewed. No pertinent family history.  Social History Social History  Substance Use Topics  . Smoking status: Former Smoker    Types: Cigarettes  . Smokeless tobacco: Never Used     Comment: Quit smoking 25 years agl  . Alcohol use 0.0 oz/week     Comment: Occassionally glass of wine     Allergies   Tramadol; Hydrocodone-acetaminophen; Morphine and related; Penicillins; Sitagliptin; and Tetracyclines & related   Review of Systems Review of Systems  Unable to perform ROS: Mental status change     Physical Exam Updated Vital Signs BP (!) 160/50 (BP Location: Left Arm)   Pulse 78   Temp 98 F (36.7 C) (Oral)   Resp 16   Ht 5\' 4"  (1.626 m)   Wt 65.8 kg   SpO2 100%   BMI 24.89 kg/m   Physical Exam  Constitutional: She appears well-developed and well-nourished.   Non-toxic appearance. No distress.  HENT:  Head: Normocephalic.  Mouth/Throat: Oropharynx is clear and moist. Mucous membranes are dry.  Contusion above left eyebrow. No facial bone crepitus/instability or scalp trauma. No oral trauma.  Eyes: Conjunctivae and EOM are normal. Pupils are equal, round, and reactive to light. No scleral icterus.  Pupils 2 mm to 1 mm and reactive bilaterally  Neck: Normal range of motion. Neck supple.  No midline cervical spine tenderness or stepoff.  Cardiovascular: Normal rate, regular rhythm, normal heart sounds and intact distal pulses.  Exam reveals no gallop and no friction rub.   No  murmur heard. Pulses:      Radial pulses are 2+ on the right side, and 2+ on the left side.       Dorsalis pedis pulses are 2+ on the right side, and 2+ on the left side.  Pulmonary/Chest: Effort normal and breath sounds normal. No respiratory distress. She has no decreased breath sounds. She has no wheezes. She has no rales. She exhibits no tenderness.  Abdominal: Soft. She exhibits no distension and no mass. There is no tenderness. There is no rebound and no guarding.  Well healed midline surgical scar  Musculoskeletal: She exhibits no edema.  Bruising to bilateral elbows. Red marks to knees and sacrum. Able to range all extremities without tenderness. Back with no tenderness or stepoff deformity  Neurological: She is alert. GCS eye subscore is 4. GCS verbal subscore is 3. GCS motor subscore is 6.  Alert, confused/babbling response. Follows commands and moves all extremities. CN II-XII intact without dysarthria.   Skin: Skin is warm and dry. She is not diaphoretic.  Psychiatric: She exhibits abnormal recent memory.     ED Treatments / Results  Labs (all labs ordered are listed, but only abnormal results are displayed) Labs Reviewed  CBC - Abnormal; Notable for the following:       Result Value   WBC 16.4 (*)    All other components within normal limits  DIFFERENTIAL  - Abnormal; Notable for the following:    Neutro Abs 13.5 (*)    Monocytes Absolute 1.6 (*)    All other components within normal limits  COMPREHENSIVE METABOLIC PANEL - Abnormal; Notable for the following:    Chloride 98 (*)    Glucose, Bld 120 (*)    Creatinine, Ser 1.08 (*)    AST 50 (*)    GFR calc non Af Amer 47 (*)    GFR calc Af Amer 55 (*)    All other components within normal limits  RAPID URINE DRUG SCREEN, HOSP PERFORMED - Abnormal; Notable for the following:    Benzodiazepines POSITIVE (*)    All other components within normal limits  URINALYSIS, ROUTINE W REFLEX MICROSCOPIC (NOT AT [facility]) - Abnormal; Notable for the following:    Glucose, UA >1000 (*)    Hgb urine dipstick TRACE (*)    Ketones, ur 15 (*)    Protein, ur 100 (*)    All other components within normal limits  CK - Abnormal; Notable for the following:    Total CK 2,341 (*)    All other components within normal limits  URINE MICROSCOPIC-ADD ON - Abnormal; Notable for the following:    Squamous Epithelial / LPF 0-5 (*)    Bacteria, UA RARE (*)    All other components within normal limits  GLUCOSE, CAPILLARY - Abnormal; Notable for the following:    Glucose-Capillary 129 (*)    All other components within normal limits  I-STAT CHEM 8, ED - Abnormal; Notable for the following:    Chloride 100 (*)    Creatinine, Ser 1.10 (*)    Glucose, Bld 112 (*)    All other components within normal limits  ETHANOL  PROTIME-INR  APTT  HEMOGLOBIN A1C  CBC  BASIC METABOLIC PANEL  CK  RAPID URINE DRUG SCREEN, HOSP PERFORMED  TSH  LIPID PANEL  I-STAT TROPOININ, ED    EKG  EKG Interpretation None       Radiology Ct Head Wo Contrast  Result Date: 10/06/2016 CLINICAL DATA:  Altered mental status. Patient  is status post fall with trauma to the left side of her head. EXAM: CT HEAD WITHOUT CONTRAST CT CERVICAL SPINE WITHOUT CONTRAST TECHNIQUE: Multidetector CT imaging of the head and cervical spine was performed  following the standard protocol without intravenous contrast. Multiplanar CT image reconstructions of the cervical spine were also generated. COMPARISON:  None. FINDINGS: CT HEAD FINDINGS Brain: No evidence of acute hemorrhage, hydrocephalus, extra-axial collection or mass lesion/mass effect. Subtle area of hypoattenuation in the subcortical left frontoparietal lobe may represent an acute ischemic infarction. Moderate brain parenchymal volume loss and microangiopathy noted. Vascular: Atherosclerotic calcifications at the skullbase. Skull: Normal. Negative for fracture or focal lesion. Sinuses/Orbits: No acute finding. Other: None. CT CERVICAL SPINE FINDINGS The study is degraded by motion artifact. Alignment: Reversal of physiologic lordosis likely secondary to extensive multifocal osteoarthritic changes of the cervical spine. Skull base and vertebrae: No evidence of fracture. Soft tissues and spinal canal: Ligamentous thickening posterior to the dens, likely degenerative. Disc levels: Moderate to severe osteoarthritic changes with disc space narrowing, remodeling of vertebral bodies, osteophyte formation. Similarly posterior facet arthropathy at all levels. Upper chest: Negative. Other: None. IMPRESSION: Area of hypoattenuation in the subcortical left frontoparietal lobe may represent acute ischemic infarction. Moderate brain parenchymal volume loss and microangiopathy. No evidence of acute traumatic injury to the cervical spine. Extensive spondylosis of the cervical spine with reversal of physiologic lordosis. Please note that the cervical spine CT is degraded by motion artifact due to patient's inability to cooperate. These results were called by telephone at the time of interpretation on 10/06/2016 at 6:25 pm to Dr. Shirlyn Goltz , who verbally acknowledged these results. Electronically Signed   By: Fidela Salisbury M.D.   On: 10/06/2016 18:28   Ct Cervical Spine Wo Contrast  Result Date: 10/06/2016 CLINICAL  DATA:  Altered mental status. Patient is status post fall with trauma to the left side of her head. EXAM: CT HEAD WITHOUT CONTRAST CT CERVICAL SPINE WITHOUT CONTRAST TECHNIQUE: Multidetector CT imaging of the head and cervical spine was performed following the standard protocol without intravenous contrast. Multiplanar CT image reconstructions of the cervical spine were also generated. COMPARISON:  None. FINDINGS: CT HEAD FINDINGS Brain: No evidence of acute hemorrhage, hydrocephalus, extra-axial collection or mass lesion/mass effect. Subtle area of hypoattenuation in the subcortical left frontoparietal lobe may represent an acute ischemic infarction. Moderate brain parenchymal volume loss and microangiopathy noted. Vascular: Atherosclerotic calcifications at the skullbase. Skull: Normal. Negative for fracture or focal lesion. Sinuses/Orbits: No acute finding. Other: None. CT CERVICAL SPINE FINDINGS The study is degraded by motion artifact. Alignment: Reversal of physiologic lordosis likely secondary to extensive multifocal osteoarthritic changes of the cervical spine. Skull base and vertebrae: No evidence of fracture. Soft tissues and spinal canal: Ligamentous thickening posterior to the dens, likely degenerative. Disc levels: Moderate to severe osteoarthritic changes with disc space narrowing, remodeling of vertebral bodies, osteophyte formation. Similarly posterior facet arthropathy at all levels. Upper chest: Negative. Other: None. IMPRESSION: Area of hypoattenuation in the subcortical left frontoparietal lobe may represent acute ischemic infarction. Moderate brain parenchymal volume loss and microangiopathy. No evidence of acute traumatic injury to the cervical spine. Extensive spondylosis of the cervical spine with reversal of physiologic lordosis. Please note that the cervical spine CT is degraded by motion artifact due to patient's inability to cooperate. These results were called by telephone at the time of  interpretation on 10/06/2016 at 6:25 pm to Dr. Shirlyn Goltz , who verbally acknowledged these results. Electronically Signed  By: Fidela Salisbury M.D.   On: 10/06/2016 18:28   Dg Chest Port 1 View  Result Date: 10/06/2016 CLINICAL DATA:  Altered mental status EXAM: PORTABLE CHEST 1 VIEW COMPARISON:  03/21/2014 FINDINGS: The heart size and mediastinal contours are within normal limits. Both lungs are clear. The visualized skeletal structures are unremarkable. IMPRESSION: No active disease. Electronically Signed   By: Kathreen Devoid   On: 10/06/2016 16:15    Procedures Procedures (including critical care time)  Medications Ordered in ED Medications  felodipine (PLENDIL) 24 hr tablet 5 mg (5 mg Oral Not Given 10/06/16 2155)  levothyroxine (SYNTHROID, LEVOTHROID) tablet 50 mcg (not administered)  losartan (COZAAR) tablet 100 mg (100 mg Oral Not Given 10/06/16 2156)  pantoprazole (PROTONIX) EC tablet 40 mg (40 mg Oral Not Given 10/06/16 2156)  ALPRAZolam (XANAX) tablet 0.25 mg (not administered)  0.9 %  sodium chloride infusion ( Intravenous New Bag/Given 10/06/16 2155)  senna-docusate (Senokot-S) tablet 1 tablet (not administered)  enoxaparin (LOVENOX) injection 40 mg (40 mg Subcutaneous Given 10/06/16 2155)  insulin aspart (novoLOG) injection 0-9 Units (not administered)  ondansetron (ZOFRAN) injection 4 mg (not administered)  ipratropium-albuterol (DUONEB) 0.5-2.5 (3) MG/3ML nebulizer solution 3 mL (not administered)  atorvastatin (LIPITOR) tablet 40 mg (not administered)  aspirin tablet 325 mg (not administered)  sodium chloride 0.9 % bolus 1,000 mL (0 mLs Intravenous Stopped 10/06/16 2027)  LORazepam (ATIVAN) injection 0.5 mg (0.5 mg Intravenous Given 10/06/16 1738)   stroke: mapping our early stages of recovery book (1 each Does not apply Given 10/06/16 2155)     Initial Impression / Assessment and Plan / ED Course  I have reviewed the triage vital signs and the nursing  notes.  Pertinent labs & imaging results that were available during my care of the patient were reviewed by me and considered in my medical decision making (see chart for details).  Clinical Course     Patient presents with altered mental status and presumed fall, last seen normal by family yesterday. On presentation she is afebrile with mild hypertension to 140s/100s but otherwise normal vital signs. She is alert and following commands, protecting her airway, but unable to provide history. She has no localizing neurological deficits on presentation. CT head reveals concern for subacute infarct. Labs otherwise reveal mild rhabdomyolysis with mildly reduced GFT but normal electrolytes. UA and CXR negative for acute infection. No fractures identified on C spine imaging. Neurology was consulted for stroke outside the window for TPA. She will be admitted to the hospitalist service for further management. She is stable and appropriate for the floor.  Final Clinical Impressions(s) / ED Diagnoses   Final diagnoses:  Stroke (cerebrum) ([place])  Stroke (cerebrum) [facility])    New Prescriptions Current Discharge Medication List       Harlin Heys, MD 10/07/16 0024    Drenda Freeze, MD 10/10/16 1028

## 2016-10-06 NOTE — ED Notes (Signed)
Delay in lab draw, peri care at this time.

## 2016-10-06 NOTE — H&P (Addendum)
History and Physical    [name] [id number] DOB: Feb 15, 1934 DOA: 10/06/2016  Referring MD/NP/PA: Dr. Harlin Heys Resident  PCP: Mathews Argyle, MD  Patient coming from: Home via EMS  Chief Complaint: Found down  HPI: [name] is a 82 y.o. 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 Much of the history is obtained by the patient's sister as the patient is currently unable to provide her own history at this time. At baseline the patient was noted to be very independent and lived alone. Patient was last seen normal sometime around 12 noon yesterday. She had been taken to the urgent care for nausea vomiting symptoms and prescribed promethazine.  Patient had been suffering from nausea and vomiting since January of this year intermittently. However, over the last 6 weeks or so symptoms have been progressively worse. She had been evaluated by her primary care provider with MRI of the abdomen showed no overt cause symptoms. Patient was scheduled to have a upper GI series performed tomorrow. The patient's sister notes that she had been trying to call her last night, but the patient did not answer. When she found her this morning she was still in her pajamas, her leg was jerking, and by the appearance of the kitchen it seemed as if she had been trying to feed the cat. The patient had complained of some diarrhea/constipation and approximately 40 pound weight loss since the beginning of the year.   ED Course: Upon admission into the hospital the patient was found to be afebrile, blood pressure elevated up to 172/116, all other vitals relatively within normal limits. Lab work revealed WBC of 16.4, chloride 98, BUN 19, creatinine 1.08, CPK 2341, and troponin 0.0. Patient was noted to be babbling and had to be given  dose of Ativan to obtain the CT scan of the brain and neck. Imaging revealed hypoattenuation of the left frontoparietal lobe that suggested acute infarct. Patient out of the window for any acute intervention. Patient's sister also noted her complaining of burning and one of her feet prior to going into the CT scanner.  Review of Systems: Unable to be obtained at this time  Past Medical History:  Diagnosis Date  . Arthritis   . Cancer [facility])    breast cancer on left - 10  years ago  . Diabetes mellitus without complication ([place])   . GERD (gastroesophageal reflux disease)   . Hypertension   . Hypothyroidism   . Stroke ([place])    2014  . Wears glasses     Past Surgical History:  Procedure Laterality Date  . ABDOMINAL HYSTERECTOMY    . APPENDECTOMY    . ARCUATE KERATECTOMY    . BUNIONECTOMY     right  . CARPAL TUNNEL RELEASE     right  . CARPAL TUNNEL RELEASE Left 05/24/2014   Procedure: LEFT CARPAL TUNNEL RELEASE LEFT THUMB TRIGGER FINGER RELEASE;  Surgeon: Cammie Sickle, MD;  Location: [place];  Service: Orthopedics;  Laterality: Left;  left thumb also site of incision  . DEBRIDEMENT OF ABDOMINAL WALL ABSCESS  1978  . OVARIAN CYST SURGERY     x2-  . TONSILLECTOMY    . TRIGGER FINGER RELEASE Left 01/25/2016   Procedure: RELEASE A-1 PULLEY LEFT RING FINGER, LEFT SMALL FINGER;  Surgeon:  Daryll Brod, MD;  Location: [place];  Service: Orthopedics;  Laterality: Left;  ANESTHESIA: IV REGIONAL FAB     reports that she has quit smoking. Her smoking use included Cigarettes. She has never used smokeless tobacco. She reports that she drinks alcohol. She reports that she does not use drugs.  Allergies  Allergen Reactions  . Tramadol Anaphylaxis  . Hydrocodone-Acetaminophen Other (See Comments)  . Morphine And Related Nausea And Vomiting  . Penicillins Swelling  . Sitagliptin Other (See Comments)  . Tetracyclines & Related Swelling    Face and eyes  swells    No family history on file.  Prior to Admission medications   Medication Sig Start Date End Date Taking? Authorizing Provider  ALPRAZolam (XANAX) 0.25 MG tablet Take 0.25 mg by mouth 3 (three) times daily.   Yes Historical Provider, MD  felodipine (PLENDIL) 5 MG 24 hr tablet Take 5 mg by mouth daily.   Yes Historical Provider, MD  levothyroxine (SYNTHROID, LEVOTHROID) 50 MCG tablet Take 50 mcg by mouth daily before breakfast.   Yes Historical Provider, MD  losartan (COZAAR) 100 MG tablet Take 100 mg by mouth daily.   Yes Historical Provider, MD  metFORMIN (GLUCOPHAGE-XR) 750 MG 24 hr tablet Take 750 mg by mouth daily with breakfast.   Yes Historical Provider, MD  ondansetron (ZOFRAN) 8 MG tablet Take 8 mg by mouth every 8 (eight) hours as needed for nausea.   Yes Historical Provider, MD  promethazine (PHENERGAN) 25 MG tablet Take 25 mg by mouth every 6 (six) hours as needed for nausea or vomiting.   Yes Historical Provider, MD  Ascorbic Acid (VITAMIN C ER PO) Take by mouth daily.    Historical Provider, MD  Coenzyme Q10 (CO Q-10) 100 MG CAPS Take by mouth.    Historical Provider, MD  Fish Oil OIL by Does not apply route daily.    Historical Provider, MD  Multiple Vitamins-Minerals (MULTIVITAMIN WITH MINERALS) tablet Take 1 tablet by mouth daily.    Historical Provider, MD  omeprazole (PRILOSEC) 20 MG capsule Take 20 mg by mouth daily.    Historical Provider, MD  Red Yeast Rice Extract (RED YEAST RICE PO) Take by mouth daily.    Historical Provider, MD  senna-docusate (SENOKOT-S) 8.6-50 MG per tablet Take 1 tablet by mouth 2 (two) times daily. Patient not taking: Reported on 10/06/2016 05/27/14   Pattricia Boss, MD  simvastatin (ZOCOR) 20 MG tablet Take 20 mg by mouth daily.    Historical Provider, MD  traMADol (ULTRAM) 50 MG tablet Take 1 tablet (50 mg total) by mouth every 6 (six) hours as needed. 01/25/16   Daryll Brod, MD  UNABLE TO FIND Magnilife pain relieving foot cream,as needed     Historical Provider, MD  vitamin B-12 (CYANOCOBALAMIN) 250 MCG tablet Take 250 mcg by mouth daily.    Historical Provider, MD    Physical Exam:    Constitutional: Elderly female who appears well-nourished, and not responding to verbal commands at this time Due to lethargy with recent Ativan given Vitals:   10/06/16 1600 10/06/16 1715 10/06/16 1917 10/06/16 1918  BP: (!) 172/116 141/78 164/75   Pulse: 88 90  90  Resp:      Temp:      TempSrc:      SpO2: 100% 98%  100%  Weight:      Height:       Eyes: PERRL, lids and conjunctivae normal ENMT: Mucous membranes are dry. Posterior pharynx clear  of any exudate or lesions.Normal dentition.  Neck: normal, supple, no masses, no thyromegaly Respiratory: clear to auscultation bilaterally, no wheezing, no crackles. Normal respiratory effort. No accessory muscle use.  Cardiovascular: Regular rate and rhythm, no murmurs / rubs / gallops. No extremity edema. 2+ pedal pulses. No carotid bruits.  Abdomen: no tenderness, no masses palpated. No hepatosplenomegaly. Bowel sounds positive.  Musculoskeletal: no clubbing / cyanosis. No joint deformity upper and lower extremities. Good ROM, no contractures. Normal muscle tone.  Skin: Bruising of the bilateral elbows, and left lateral epicanthal fold of eye Neurologic: Patient appears able to move all extremities to painful stimuli. Strength cannot be tested for at this time due to lethargy.  Psychiatric: Not able to assess    Labs on Admission: I have personally reviewed following labs and imaging studies  CBC:  Recent Labs Lab 10/06/16 1547 10/06/16 1559  WBC 16.4*  --   NEUTROABS 13.5*  --   HGB 13.5 13.3  HCT 38.8 39.0  MCV 93.9  --   PLT 267  --    Basic Metabolic Panel:  Recent Labs Lab 10/06/16 1547 10/06/16 1559  NA 137 138  K 3.8 3.8  CL 98* 100*  CO2 26  --   GLUCOSE 120* 112*  BUN 19 20  CREATININE 1.08* 1.10*  CALCIUM 9.8  --    GFR: Estimated Creatinine Clearance:  38.1 mL/min (by C-G formula based on SCr of 1.1 mg/dL (H)). Liver Function Tests:  Recent Labs Lab 10/06/16 1547  AST 50*  ALT 21  ALKPHOS 71  BILITOT 1.0  PROT 7.3  ALBUMIN 4.3   No results for input(s): LIPASE, AMYLASE in the last 168 hours. No results for input(s): AMMONIA in the last 168 hours. Coagulation Profile:  Recent Labs Lab 10/06/16 1547  INR 0.96   Cardiac Enzymes:  Recent Labs Lab 10/06/16 1547  CKTOTAL 2,341*   BNP (last 3 results) No results for input(s): PROBNP in the last 8760 hours. HbA1C: No results for input(s): HGBA1C in the last 72 hours. CBG: No results for input(s): GLUCAP in the last 168 hours. Lipid Profile: No results for input(s): CHOL, HDL, LDLCALC, TRIG, CHOLHDL, LDLDIRECT in the last 72 hours. Thyroid Function Tests: No results for input(s): TSH, T4TOTAL, FREET4, T3FREE, THYROIDAB in the last 72 hours. Anemia Panel: No results for input(s): VITAMINB12, FOLATE, FERRITIN, TIBC, IRON, RETICCTPCT in the last 72 hours. Urine analysis:    Component Value Date/Time   COLORURINE YELLOW 10/06/2016 [place] 10/06/2016 1535   LABSPEC 1.015 10/06/2016 1535   PHURINE 6.0 10/06/2016 1535   GLUCOSEU >1000 (A) 10/06/2016 1535   HGBUR TRACE (A) 10/06/2016 1535   BILIRUBINUR NEGATIVE 10/06/2016 1535   KETONESUR 15 (A) 10/06/2016 1535   PROTEINUR 100 (A) 10/06/2016 1535   NITRITE NEGATIVE 10/06/2016 1535   LEUKOCYTESUR NEGATIVE 10/06/2016 1535   Sepsis Labs: No results found for this or any previous visit (from the past 240 hour(s)).   Radiological Exams on Admission: Ct Head Wo Contrast  Result Date: 10/06/2016 CLINICAL DATA:  Altered mental status. Patient is status post fall with trauma to the left side of her head. EXAM: CT HEAD WITHOUT CONTRAST CT CERVICAL SPINE WITHOUT CONTRAST TECHNIQUE: Multidetector CT imaging of the head and cervical spine was performed following the standard protocol without intravenous contrast.  Multiplanar CT image reconstructions of the cervical spine were also generated. COMPARISON:  None. FINDINGS: CT HEAD FINDINGS Brain: No evidence of acute hemorrhage, hydrocephalus, extra-axial collection  or mass lesion/mass effect. Subtle area of hypoattenuation in the subcortical left frontoparietal lobe may represent an acute ischemic infarction. Moderate brain parenchymal volume loss and microangiopathy noted. Vascular: Atherosclerotic calcifications at the skullbase. Skull: Normal. Negative for fracture or focal lesion. Sinuses/Orbits: No acute finding. Other: None. CT CERVICAL SPINE FINDINGS The study is degraded by motion artifact. Alignment: Reversal of physiologic lordosis likely secondary to extensive multifocal osteoarthritic changes of the cervical spine. Skull base and vertebrae: No evidence of fracture. Soft tissues and spinal canal: Ligamentous thickening posterior to the dens, likely degenerative. Disc levels: Moderate to severe osteoarthritic changes with disc space narrowing, remodeling of vertebral bodies, osteophyte formation. Similarly posterior facet arthropathy at all levels. Upper chest: Negative. Other: None. IMPRESSION: Area of hypoattenuation in the subcortical left frontoparietal lobe may represent acute ischemic infarction. Moderate brain parenchymal volume loss and microangiopathy. No evidence of acute traumatic injury to the cervical spine. Extensive spondylosis of the cervical spine with reversal of physiologic lordosis. Please note that the cervical spine CT is degraded by motion artifact due to patient's inability to cooperate. These results were called by telephone at the time of interpretation on 10/06/2016 at 6:25 pm to Dr. Shirlyn Goltz , who verbally acknowledged these results. Electronically Signed   By: Fidela Salisbury M.D.   On: 10/06/2016 18:28   Ct Cervical Spine Wo Contrast  Result Date: 10/06/2016 CLINICAL DATA:  Altered mental status. Patient is status post fall  with trauma to the left side of her head. EXAM: CT HEAD WITHOUT CONTRAST CT CERVICAL SPINE WITHOUT CONTRAST TECHNIQUE: Multidetector CT imaging of the head and cervical spine was performed following the standard protocol without intravenous contrast. Multiplanar CT image reconstructions of the cervical spine were also generated. COMPARISON:  None. FINDINGS: CT HEAD FINDINGS Brain: No evidence of acute hemorrhage, hydrocephalus, extra-axial collection or mass lesion/mass effect. Subtle area of hypoattenuation in the subcortical left frontoparietal lobe may represent an acute ischemic infarction. Moderate brain parenchymal volume loss and microangiopathy noted. Vascular: Atherosclerotic calcifications at the skullbase. Skull: Normal. Negative for fracture or focal lesion. Sinuses/Orbits: No acute finding. Other: None. CT CERVICAL SPINE FINDINGS The study is degraded by motion artifact. Alignment: Reversal of physiologic lordosis likely secondary to extensive multifocal osteoarthritic changes of the cervical spine. Skull base and vertebrae: No evidence of fracture. Soft tissues and spinal canal: Ligamentous thickening posterior to the dens, likely degenerative. Disc levels: Moderate to severe osteoarthritic changes with disc space narrowing, remodeling of vertebral bodies, osteophyte formation. Similarly posterior facet arthropathy at all levels. Upper chest: Negative. Other: None. IMPRESSION: Area of hypoattenuation in the subcortical left frontoparietal lobe may represent acute ischemic infarction. Moderate brain parenchymal volume loss and microangiopathy. No evidence of acute traumatic injury to the cervical spine. Extensive spondylosis of the cervical spine with reversal of physiologic lordosis. Please note that the cervical spine CT is degraded by motion artifact due to patient's inability to cooperate. These results were called by telephone at the time of interpretation on 10/06/2016 at 6:25 pm to Dr. Shirlyn Goltz  , who verbally acknowledged these results. Electronically Signed   By: Fidela Salisbury M.D.   On: 10/06/2016 18:28   Dg Chest Port 1 View  Result Date: 10/06/2016 CLINICAL DATA:  Altered mental status EXAM: PORTABLE CHEST 1 VIEW COMPARISON:  03/21/2014 FINDINGS: The heart size and mediastinal contours are within normal limits. Both lungs are clear. The visualized skeletal structures are unremarkable. IMPRESSION: No active disease. Electronically Signed   By: Kathreen Devoid  On: 10/06/2016 16:15      Assessment/Plan Possible Stroke/ expressive aphasia: Acute/subacute. Patient found on floor at home with note of leg shaking. Initial CT following hypodensity of the frontoparietal region. Question the possibility of expressive aphasia as patient found to be babbling with coherent words on admission and confused. Exam overall limited secondary to patient being given Ativan for CT scans. - Admit to telemetry  - Stroke orderset initiated - Neuro checks - Check MRI /MRA of brain, echo, and vascular carotid duplex - Check hemoglobin A1c and lipid panel in a.m. - Follow-up EKG - PT/OT/Speech eval and treat - ASA - Monitor for any seizure-like activity  - Avoid sedating medications if able - Appreciate neurology consultation will follow-up for further recommendations  Rhabdomyolysis: Acute. Patient has mild rhabdomyolysis with initial CPK elevated to 2341 as patient was found down in her home. - IV fluids normal saline at 100 ml/hr - recheck CPK in a.m.   Leukocytosis: WBC elevated at 16.4 on admission. Patient afebrile with vital signs within normal limits, chest x-ray negative for any signs of infiltrate, and urinalysis negative for any signs of infection. Worsening leukocytosis reactive versus possibility of infection. - Continue to monitor off antibiotics at this time - Recheck CBC in a.m.   Essential HTN - continue losartan and felodipine   Hypothyroidism - Check TSH  - continue  Levothyroxine  Diabetes mellitus type II - hypoglycemia protocol - Held metformin - CBGs every before meals with sensitive sliding scale insulin  Hyperlipidemia - Continue simvastatin   Jerrye Bushy - continue protonix   DVT prophylaxis: Lovenox Code Status: Full  Family Communication: Explained overall care with patient's sister  was present at bedside Disposition Plan: TBD  Consults called: Neurology Admission status:    Norval Morton MD [facility] Pager [phone]  If 7PM-7AM, please contact night-coverage [email or web address] Password [email or web address]  10/06/2016, 7:45 PM

## 2016-10-06 NOTE — ED Notes (Signed)
Sister in waiting room 

## 2016-10-06 NOTE — Progress Notes (Signed)
Patient received from E.D alert  Does not follow commands making bubbling sounds responds to pain  Patient made comfortable  Sister accompanied patient from E.D left after patient situated in bed. Telemetry initiated and verified.

## 2016-10-06 NOTE — Consult Note (Addendum)
NEURO HOSPITALIST CONSULT NOTE   Requestig physician: Dr. Tamala Julian  Reason for Consult: New stroke seen on CT  History obtained from:    Chart    HPI:                                                                                                                                          [name] is an 82 y.o. female who presented to the ED via EMS after being found down on a tile floor in her kitchen with evidence of trauma to the left side of her head. LKN at 12:00 yesterday. She was confused and babbling nonsensically on arrival.  She has a history of prior stroke in 2014. PMHx also includes hypothyroidism, HTN, breast CA and DM.   CT of cervical spine revealed no evidence of acute traumatic injury to the cervical spine. Extensive spondylosis of the cervical spine with reversal of physiologic lordosis was noted.  CT head revealed three subtle areas of hypoattenuation in the subcortical left frontal lobe, possibly representing subacute ischemic infarctions. Moderate brain parenchymal volume loss and microangiopathy were also noted.   Past Medical History:  Diagnosis Date  . Arthritis   . Cancer [facility])    breast cancer on left - 10  years ago  . Diabetes mellitus without complication ([place])   . GERD (gastroesophageal reflux disease)   . Hypertension   . Hypothyroidism   . Stroke ([place])    2014  . Wears glasses     Past Surgical History:  Procedure Laterality Date  . ABDOMINAL HYSTERECTOMY    . APPENDECTOMY    . ARCUATE KERATECTOMY    . BUNIONECTOMY     right  . CARPAL TUNNEL RELEASE     right  . CARPAL TUNNEL RELEASE Left 05/24/2014   Procedure: LEFT CARPAL TUNNEL RELEASE LEFT THUMB TRIGGER FINGER RELEASE;  Surgeon: Cammie Sickle, MD;  Location: [place];  Service: Orthopedics;  Laterality: Left;  left thumb also site of incision  . DEBRIDEMENT OF ABDOMINAL WALL ABSCESS  1978  . OVARIAN CYST SURGERY     x2-  . TONSILLECTOMY     . TRIGGER FINGER RELEASE Left 01/25/2016   Procedure: RELEASE A-1 PULLEY LEFT RING FINGER, LEFT SMALL FINGER;  Surgeon: Daryll Brod, MD;  Location: [place];  Service: Orthopedics;  Laterality: Left;  ANESTHESIA: IV REGIONAL FAB    No family history on file.  Social History:  reports that she has quit smoking. Her smoking use included Cigarettes. She has never used smokeless tobacco. She reports that she drinks alcohol. She reports that she does not use drugs.  Allergies  Allergen Reactions  . Tramadol Anaphylaxis  . Hydrocodone-Acetaminophen Other (See Comments)  . Morphine And Related Nausea  And Vomiting  . Penicillins Swelling  . Sitagliptin Other (See Comments)  . Tetracyclines & Related Swelling    Face and eyes swells    MEDICATIONS:                                                                                                                     No current facility-administered medications on file prior to encounter.    Current Outpatient Prescriptions on File Prior to Encounter  Medication Sig Dispense Refill  . felodipine (PLENDIL) 5 MG 24 hr tablet Take 5 mg by mouth daily.    Marland Kitchen levothyroxine (SYNTHROID, LEVOTHROID) 50 MCG tablet Take 50 mcg by mouth daily before breakfast.    . losartan (COZAAR) 100 MG tablet Take 100 mg by mouth daily.    . metFORMIN (GLUCOPHAGE-XR) 750 MG 24 hr tablet Take 750 mg by mouth daily with breakfast.    . Ascorbic Acid (VITAMIN C ER PO) Take by mouth daily.    . Coenzyme Q10 (CO Q-10) 100 MG CAPS Take by mouth.    . Fish Oil OIL by Does not apply route daily.    . Multiple Vitamins-Minerals (MULTIVITAMIN WITH MINERALS) tablet Take 1 tablet by mouth daily.    Marland Kitchen omeprazole (PRILOSEC) 20 MG capsule Take 20 mg by mouth daily.    . Red Yeast Rice Extract (RED YEAST RICE PO) Take by mouth daily.    Marland Kitchen senna-docusate (SENOKOT-S) 8.6-50 MG per tablet Take 1 tablet by mouth 2 (two) times daily. (Patient not taking: Reported on  10/06/2016) 30 tablet 0  . simvastatin (ZOCOR) 20 MG tablet Take 20 mg by mouth daily.    . traMADol (ULTRAM) 50 MG tablet Take 1 tablet (50 mg total) by mouth every 6 (six) hours as needed. 20 tablet 0  . UNABLE TO FIND Magnilife pain relieving foot cream,as needed    . vitamin B-12 (CYANOCOBALAMIN) 250 MCG tablet Take 250 mcg by mouth daily.       Current Facility-Administered Medications:  .  0.9 %  sodium chloride infusion, , Intravenous, Continuous, Norval Morton, MD, Last Rate: 75 mL/hr at 10/06/16 2155 .  ALPRAZolam (XANAX) tablet 0.25 mg, 0.25 mg, Oral, TID PRN, Norval Morton, MD .  enoxaparin (LOVENOX) injection 40 mg, 40 mg, Subcutaneous, Q24H, Norval Morton, MD, 40 mg at 10/06/16 2155 .  felodipine (PLENDIL) 24 hr tablet 5 mg, 5 mg, Oral, Daily, Norval Morton, MD .  Derrill Memo ON 10/07/2016] insulin aspart (novoLOG) injection 0-9 Units, 0-9 Units, Subcutaneous, TID WC, Rondell A Smith, MD .  ipratropium-albuterol (DUONEB) 0.5-2.5 (3) MG/3ML nebulizer solution 3 mL, 3 mL, Nebulization, Q4H PRN, Norval Morton, MD .  Derrill Memo ON 10/07/2016] levothyroxine (SYNTHROID, LEVOTHROID) tablet 50 mcg, 50 mcg, Oral, QAC breakfast, Rondell A Tamala Julian, MD .  losartan (COZAAR) tablet 100 mg, 100 mg, Oral, Daily, Rondell A Smith, MD .  ondansetron (ZOFRAN) injection 4 mg, 4 mg, Intravenous, Q6H PRN, Norval Morton, MD .  pantoprazole (PROTONIX)  EC tablet 40 mg, 40 mg, Oral, Daily, Rondell A Smith, MD .  senna-docusate (Senokot-S) tablet 1 tablet, 1 tablet, Oral, QHS PRN, Norval Morton, MD .  simvastatin (ZOCOR) tablet 20 mg, 20 mg, Oral, Daily, Rondell A Smith, MD    ROS:                                                                                                                                       Unable to obtain ROS due to recent sedation.    Blood pressure 164/75, pulse 90, temperature 99.7 F (37.6 C), temperature source Rectal, resp. rate 13, height 5\' 4"  (1.626 m), weight 65.8  kg (145 lb), SpO2 100 %.   General Examination:                                                                                                      HEENT-  Normocephalic. Bruise over left eye noted.  Lungs- Respirations unlabored. No gross wheezes.  Extremities- Warm and well perfused.   Neurological Examination Mental Status: Somnolent/sedated after receiving medication for CT. Mumbles, yells and tells examiner to stop with noxious stimuli. Does not follow verbal commands in context of somnolence/sedation.  Cranial Nerves: II: Blinks to threat in right visual field. Equivocal response to threat in left visual field. Pupils equal, round and reactive to light  III,IV, VI: ptosis not present, versions intact to right and left.  V,VII: Grimaces symmetrically to noxious bilaterally.  VIII: Unable to formally test IX,X: No hypophonia noted XI:  No asymmetry noted XII: Does not follow command for tongue protrusion Motor/Sensory: Moves and thrashes all 4 extremities at 5/5 symmetrically in reaction to noxious stimuli. Muscle bulk is normal.  Deep Tendon Reflexes: No asymmetry noted on limited exam.  Plantars: Right: downgoing   Left: downgoing Cerebellar: No gross ataxia noted.  Gait: Deferred due to AMS   Lab Results: Basic Metabolic Panel:  Recent Labs Lab 10/06/16 1547 10/06/16 1559  NA 137 138  K 3.8 3.8  CL 98* 100*  CO2 26  --   GLUCOSE 120* 112*  BUN 19 20  CREATININE 1.08* 1.10*  CALCIUM 9.8  --     Liver Function Tests:  Recent Labs Lab 10/06/16 1547  AST 50*  ALT 21  ALKPHOS 71  BILITOT 1.0  PROT 7.3  ALBUMIN 4.3   No results for input(s): LIPASE, AMYLASE in the last 168 hours. No results for input(s): AMMONIA in the last 168 hours.  CBC:  Recent Labs Lab 10/06/16 1547 10/06/16 1559  WBC 16.4*  --   NEUTROABS 13.5*  --   HGB 13.5 13.3  HCT 38.8 39.0  MCV 93.9  --   PLT 267  --     Cardiac Enzymes:  Recent Labs Lab 10/06/16 1547  CKTOTAL  2,341*    Lipid Panel: No results for input(s): CHOL, TRIG, HDL, CHOLHDL, VLDL, LDLCALC in the last 168 hours.  CBG: No results for input(s): GLUCAP in the last 168 hours.  Microbiology: No results found for this or any previous visit.  Coagulation Studies:  Recent Labs  10/06/16 1547  LABPROT 12.8  INR 0.96    Imaging: Ct Head Wo Contrast  Result Date: 10/06/2016 CLINICAL DATA:  Altered mental status. Patient is status post fall with trauma to the left side of her head. EXAM: CT HEAD WITHOUT CONTRAST CT CERVICAL SPINE WITHOUT CONTRAST TECHNIQUE: Multidetector CT imaging of the head and cervical spine was performed following the standard protocol without intravenous contrast. Multiplanar CT image reconstructions of the cervical spine were also generated. COMPARISON:  None. FINDINGS: CT HEAD FINDINGS Brain: No evidence of acute hemorrhage, hydrocephalus, extra-axial collection or mass lesion/mass effect. Subtle area of hypoattenuation in the subcortical left frontoparietal lobe may represent an acute ischemic infarction. Moderate brain parenchymal volume loss and microangiopathy noted. Vascular: Atherosclerotic calcifications at the skullbase. Skull: Normal. Negative for fracture or focal lesion. Sinuses/Orbits: No acute finding. Other: None. CT CERVICAL SPINE FINDINGS The study is degraded by motion artifact. Alignment: Reversal of physiologic lordosis likely secondary to extensive multifocal osteoarthritic changes of the cervical spine. Skull base and vertebrae: No evidence of fracture. Soft tissues and spinal canal: Ligamentous thickening posterior to the dens, likely degenerative. Disc levels: Moderate to severe osteoarthritic changes with disc space narrowing, remodeling of vertebral bodies, osteophyte formation. Similarly posterior facet arthropathy at all levels. Upper chest: Negative. Other: None. IMPRESSION: Area of hypoattenuation in the subcortical left frontoparietal lobe may  represent acute ischemic infarction. Moderate brain parenchymal volume loss and microangiopathy. No evidence of acute traumatic injury to the cervical spine. Extensive spondylosis of the cervical spine with reversal of physiologic lordosis. Please note that the cervical spine CT is degraded by motion artifact due to patient's inability to cooperate. These results were called by telephone at the time of interpretation on 10/06/2016 at 6:25 pm to Dr. Shirlyn Goltz , who verbally acknowledged these results. Electronically Signed   By: Fidela Salisbury M.D.   On: 10/06/2016 18:28   Ct Cervical Spine Wo Contrast  Result Date: 10/06/2016 CLINICAL DATA:  Altered mental status. Patient is status post fall with trauma to the left side of her head. EXAM: CT HEAD WITHOUT CONTRAST CT CERVICAL SPINE WITHOUT CONTRAST TECHNIQUE: Multidetector CT imaging of the head and cervical spine was performed following the standard protocol without intravenous contrast. Multiplanar CT image reconstructions of the cervical spine were also generated. COMPARISON:  None. FINDINGS: CT HEAD FINDINGS Brain: No evidence of acute hemorrhage, hydrocephalus, extra-axial collection or mass lesion/mass effect. Subtle area of hypoattenuation in the subcortical left frontoparietal lobe may represent an acute ischemic infarction. Moderate brain parenchymal volume loss and microangiopathy noted. Vascular: Atherosclerotic calcifications at the skullbase. Skull: Normal. Negative for fracture or focal lesion. Sinuses/Orbits: No acute finding. Other: None. CT CERVICAL SPINE FINDINGS The study is degraded by motion artifact. Alignment: Reversal of physiologic lordosis likely secondary to extensive multifocal osteoarthritic changes of the cervical spine. Skull base and vertebrae: No evidence of fracture. Soft  tissues and spinal canal: Ligamentous thickening posterior to the dens, likely degenerative. Disc levels: Moderate to severe osteoarthritic changes with  disc space narrowing, remodeling of vertebral bodies, osteophyte formation. Similarly posterior facet arthropathy at all levels. Upper chest: Negative. Other: None. IMPRESSION: Area of hypoattenuation in the subcortical left frontoparietal lobe may represent acute ischemic infarction. Moderate brain parenchymal volume loss and microangiopathy. No evidence of acute traumatic injury to the cervical spine. Extensive spondylosis of the cervical spine with reversal of physiologic lordosis. Please note that the cervical spine CT is degraded by motion artifact due to patient's inability to cooperate. These results were called by telephone at the time of interpretation on 10/06/2016 at 6:25 pm to Dr. Shirlyn Goltz , who verbally acknowledged these results. Electronically Signed   By: Fidela Salisbury M.D.   On: 10/06/2016 18:28   Dg Chest Port 1 View  Result Date: 10/06/2016 CLINICAL DATA:  Altered mental status EXAM: PORTABLE CHEST 1 VIEW COMPARISON:  03/21/2014 FINDINGS: The heart size and mediastinal contours are within normal limits. Both lungs are clear. The visualized skeletal structures are unremarkable. IMPRESSION: No active disease. Electronically Signed   By: Kathreen Devoid   On: 10/06/2016 16:15    Assessment 1. Status post fall with confusion. Exam limited by AMS and somnolence in the context of recent sedating medication. No lateralized weakness or sensory deficit. Dysphasia most likely secondary to left anterior frontal lobe subacute stroke, as seen on CT. Additional foci of infarction not visible on CT are also possible, suspect perisylvian cortices are also involved - MCA distribution within superior aspect of left temporal lobe and inferior aspect of mid left frontal lobe.  2. Stroke risk factors include DM, HTN and prior stroke.  3. Leukocytosis.  4. Elevated CK level. Most likely secondary to prolonged immobilization on a hard surface.   Plan 1. MRI brain, MRA of head and neck, TTE.   2.  PT/OT/Speech.  3. Start ASA 325 mg po qd, first dose now.  4. Switch simvastatin to atorvastatin 40 mg po qd.  5. Permissive HTN x 24 hours.  6. TSH level.  7. Work up of leukocytosis.  8. Serial CK levels.  9. Cardiac monitoring to assess for possible intermittent a-fib.   Electronically signed: Dr. Kerney Elbe 10/06/2016, 7:32 PM

## 2016-10-06 NOTE — ED Triage Notes (Signed)
To ED via [facility] from home with c/o found on tile floor in kitchen. Last seen by family at 12:00 yesterday. On arrival - pt is confused, babbling. Pt has bruise over left eye, reddened areas over both elbows, left hand, left knee, coccyx area-- do not blanche.  Pt moves all extremities. Does follow commands at times.

## 2016-10-06 NOTE — ED Notes (Signed)
Patient transported to CT 

## 2016-10-07 ENCOUNTER — Observation Stay (HOSPITAL_COMMUNITY): Payer: Medicare Other

## 2016-10-07 ENCOUNTER — Ambulatory Visit (HOSPITAL_COMMUNITY): Admission: RE | Admit: 2016-10-07 | Payer: Medicare Other | Source: Ambulatory Visit

## 2016-10-07 DIAGNOSIS — E785 Hyperlipidemia, unspecified: Secondary | ICD-10-CM | POA: Diagnosis not present

## 2016-10-07 DIAGNOSIS — I6789 Other cerebrovascular disease: Secondary | ICD-10-CM

## 2016-10-07 DIAGNOSIS — Z888 Allergy status to other drugs, medicaments and biological substances status: Secondary | ICD-10-CM | POA: Diagnosis not present

## 2016-10-07 DIAGNOSIS — Z885 Allergy status to narcotic agent status: Secondary | ICD-10-CM | POA: Diagnosis not present

## 2016-10-07 DIAGNOSIS — I69320 Aphasia following cerebral infarction: Secondary | ICD-10-CM | POA: Diagnosis not present

## 2016-10-07 DIAGNOSIS — E1122 Type 2 diabetes mellitus with diabetic chronic kidney disease: Secondary | ICD-10-CM | POA: Diagnosis present

## 2016-10-07 DIAGNOSIS — Z880 Allergy status to penicillin: Secondary | ICD-10-CM | POA: Diagnosis not present

## 2016-10-07 DIAGNOSIS — R4182 Altered mental status, unspecified: Secondary | ICD-10-CM | POA: Diagnosis not present

## 2016-10-07 DIAGNOSIS — Z881 Allergy status to other antibiotic agents status: Secondary | ICD-10-CM | POA: Diagnosis not present

## 2016-10-07 DIAGNOSIS — R197 Diarrhea, unspecified: Secondary | ICD-10-CM | POA: Diagnosis present

## 2016-10-07 DIAGNOSIS — I639 Cerebral infarction, unspecified: Secondary | ICD-10-CM | POA: Diagnosis not present

## 2016-10-07 DIAGNOSIS — G459 Transient cerebral ischemic attack, unspecified: Secondary | ICD-10-CM | POA: Diagnosis not present

## 2016-10-07 DIAGNOSIS — S0990XA Unspecified injury of head, initial encounter: Secondary | ICD-10-CM | POA: Diagnosis not present

## 2016-10-07 DIAGNOSIS — Z7984 Long term (current) use of oral hypoglycemic drugs: Secondary | ICD-10-CM | POA: Diagnosis not present

## 2016-10-07 DIAGNOSIS — R5381 Other malaise: Secondary | ICD-10-CM | POA: Diagnosis present

## 2016-10-07 DIAGNOSIS — I63412 Cerebral infarction due to embolism of left middle cerebral artery: Secondary | ICD-10-CM | POA: Diagnosis not present

## 2016-10-07 DIAGNOSIS — F411 Generalized anxiety disorder: Secondary | ICD-10-CM | POA: Diagnosis not present

## 2016-10-07 DIAGNOSIS — D72829 Elevated white blood cell count, unspecified: Secondary | ICD-10-CM | POA: Diagnosis not present

## 2016-10-07 DIAGNOSIS — F419 Anxiety disorder, unspecified: Secondary | ICD-10-CM | POA: Diagnosis present

## 2016-10-07 DIAGNOSIS — R4701 Aphasia: Secondary | ICD-10-CM

## 2016-10-07 DIAGNOSIS — N182 Chronic kidney disease, stage 2 (mild): Secondary | ICD-10-CM | POA: Diagnosis not present

## 2016-10-07 DIAGNOSIS — Z853 Personal history of malignant neoplasm of breast: Secondary | ICD-10-CM | POA: Diagnosis not present

## 2016-10-07 DIAGNOSIS — M6282 Rhabdomyolysis: Secondary | ICD-10-CM | POA: Diagnosis present

## 2016-10-07 DIAGNOSIS — S0012XA Contusion of left eyelid and periocular area, initial encounter: Secondary | ICD-10-CM | POA: Diagnosis present

## 2016-10-07 DIAGNOSIS — R55 Syncope and collapse: Secondary | ICD-10-CM | POA: Diagnosis not present

## 2016-10-07 DIAGNOSIS — K5900 Constipation, unspecified: Secondary | ICD-10-CM | POA: Diagnosis present

## 2016-10-07 DIAGNOSIS — E039 Hypothyroidism, unspecified: Secondary | ICD-10-CM | POA: Diagnosis not present

## 2016-10-07 DIAGNOSIS — E1151 Type 2 diabetes mellitus with diabetic peripheral angiopathy without gangrene: Secondary | ICD-10-CM | POA: Diagnosis present

## 2016-10-07 DIAGNOSIS — D62 Acute posthemorrhagic anemia: Secondary | ICD-10-CM | POA: Diagnosis present

## 2016-10-07 DIAGNOSIS — Z79899 Other long term (current) drug therapy: Secondary | ICD-10-CM | POA: Diagnosis not present

## 2016-10-07 DIAGNOSIS — E118 Type 2 diabetes mellitus with unspecified complications: Secondary | ICD-10-CM | POA: Diagnosis not present

## 2016-10-07 DIAGNOSIS — K219 Gastro-esophageal reflux disease without esophagitis: Secondary | ICD-10-CM | POA: Diagnosis not present

## 2016-10-07 DIAGNOSIS — S5002XA Contusion of left elbow, initial encounter: Secondary | ICD-10-CM | POA: Diagnosis present

## 2016-10-07 DIAGNOSIS — Z87891 Personal history of nicotine dependence: Secondary | ICD-10-CM | POA: Diagnosis not present

## 2016-10-07 DIAGNOSIS — S5001XA Contusion of right elbow, initial encounter: Secondary | ICD-10-CM | POA: Diagnosis present

## 2016-10-07 DIAGNOSIS — I10 Essential (primary) hypertension: Secondary | ICD-10-CM | POA: Diagnosis not present

## 2016-10-07 DIAGNOSIS — W1830XA Fall on same level, unspecified, initial encounter: Secondary | ICD-10-CM | POA: Diagnosis present

## 2016-10-07 DIAGNOSIS — F332 Major depressive disorder, recurrent severe without psychotic features: Secondary | ICD-10-CM | POA: Diagnosis not present

## 2016-10-07 DIAGNOSIS — T426X5A Adverse effect of other antiepileptic and sedative-hypnotic drugs, initial encounter: Secondary | ICD-10-CM | POA: Diagnosis present

## 2016-10-07 DIAGNOSIS — Z8673 Personal history of transient ischemic attack (TIA), and cerebral infarction without residual deficits: Secondary | ICD-10-CM | POA: Diagnosis not present

## 2016-10-07 LAB — BASIC METABOLIC PANEL
ANION GAP: 9 (ref 5–15)
BUN: 17 mg/dL (ref 6–20)
CO2: 25 mmol/L (ref 22–32)
Calcium: 9 mg/dL (ref 8.9–10.3)
Chloride: 106 mmol/L (ref 101–111)
Creatinine, Ser: 1.06 mg/dL — ABNORMAL HIGH (ref 0.44–1.00)
GFR, EST AFRICAN AMERICAN: 56 mL/min — AB (ref >60)
GFR, EST NON AFRICAN AMERICAN: 48 mL/min — AB (ref >60)
GLUCOSE: 95 mg/dL (ref 65–99)
POTASSIUM: 3.8 mmol/L (ref 3.5–5.1)
Sodium: 140 mmol/L (ref 135–145)

## 2016-10-07 LAB — LIPID PANEL
CHOLESTEROL: 154 mg/dL (ref 0–200)
HDL: 52 mg/dL (ref >40)
LDL Cholesterol: 80 mg/dL (ref 0–99)
TRIGLYCERIDES: 108 mg/dL (ref <150)
Total CHOL/HDL Ratio: 3 RATIO
VLDL: 22 mg/dL (ref 0–40)

## 2016-10-07 LAB — TSH: TSH: 1.51 u[IU]/mL (ref 0.350–4.500)

## 2016-10-07 LAB — GLUCOSE, CAPILLARY
GLUCOSE-CAPILLARY: 112 mg/dL — AB (ref 65–99)
Glucose-Capillary: 94 mg/dL (ref 65–99)
Glucose-Capillary: 90 mg/dL (ref 65–99)
Glucose-Capillary: 133 mg/dL — ABNORMAL HIGH (ref 65–99)

## 2016-10-07 LAB — CBC
HEMATOCRIT: 36.2 % (ref 36.0–46.0)
Hemoglobin: 12.2 g/dL (ref 12.0–15.0)
MCH: 32.6 pg (ref 26.0–34.0)
MCHC: 33.7 g/dL (ref 30.0–36.0)
MCV: 96.8 fL (ref 78.0–100.0)
PLATELETS: 223 10*3/uL (ref 150–400)
RBC: 3.74 MIL/uL — AB (ref 3.87–5.11)
RDW: 12.6 % (ref 11.5–15.5)
WBC: 9.5 10*3/uL (ref 4.0–10.5)

## 2016-10-07 LAB — ECHOCARDIOGRAM COMPLETE
Height: 64 in
WEIGHTICAEL: 2320 [oz_av]

## 2016-10-07 LAB — CK: Total CK: 1584 U/L — ABNORMAL HIGH (ref 38–234)

## 2016-10-07 MED ORDER — ACETAMINOPHEN 325 MG PO TABS
650.0000 mg | ORAL_TABLET | ORAL | Status: DC | PRN
  Administered 2016-10-07 – 2016-10-09 (×3): 650 mg via ORAL
  Filled 2016-10-07 (×3): qty 2

## 2016-10-07 MED ORDER — LORAZEPAM 2 MG/ML IJ SOLN
1.0000 mg | Freq: Once | INTRAMUSCULAR | Status: AC
  Administered 2016-10-07: 1 mg via INTRAVENOUS
  Filled 2016-10-07: qty 1

## 2016-10-07 NOTE — Progress Notes (Signed)
  Echocardiogram 2D Echocardiogram has been performed.  Tresa Res 10/07/2016, 3:28 PM

## 2016-10-07 NOTE — Progress Notes (Signed)
EEG Completed; Results Pending  

## 2016-10-07 NOTE — Progress Notes (Signed)
PT Cancellation Note  Patient Details Name: [name] MRN: [id number] DOB: 1934/10/06   Cancelled Treatment:    Reason Eval/Treat Not Completed: Fatigue/lethargy limiting ability to participate. Pt unable to be aroused enough to participate in PT eval. Will continue to follow and complete PT eval when pt is able to participate.    Rolinda Roan 10/07/2016, 10:11 AM   Rolinda Roan, PT, DPT Acute Rehabilitation Services Pager: [phone]

## 2016-10-07 NOTE — Progress Notes (Signed)
Patient awake. Alert and oriented X3  Disoriented to time . NIHSS repeated patient scored a 2 for language and speech. Still exhibiting some anxiety awaiting for MRI pick up.

## 2016-10-07 NOTE — Progress Notes (Signed)
STROKE TEAM PROGRESS NOTE   HISTORY OF PRESENT ILLNESS (per record) RISSA FRALEIGH is an 82 y.o. female who presented to the ED via EMS after being found down on a tile floor in her kitchen with evidence of trauma to the left side of her head. LKN at 12:00 yesterday 111/11/17. She was confused and babbling nonsensically on arrival.  She has a history of prior stroke in 2014. PMHx also includes hypothyroidism, HTN, breast CA and DM.   CT of cervical spine revealed no evidence of acute traumatic injury to the cervical spine. Extensive spondylosis of the cervical spine with reversal of physiologic lordosis was noted. CT head revealed three subtle areas of hypoattenuation in the subcortical left frontal lobe, possibly representing subacute ischemic infarctions. Moderate brain parenchymal volume loss and microangiopathy were also noted.  Patient was not administered IV t-PA. She was admitted for further evaluation and treatment.   SUBJECTIVE (INTERVAL HISTORY) Her grandneice is at the bedside.  Patient sleeping soundly after getting ativan. Grandneice states she was coherent earlier this am when she woke up. Overall she feels her condition is stable. She is a good historian. She reports one other recent passing out episode.    OBJECTIVE Temp:  [98 F (36.7 C)-99.7 F (37.6 C)] 98.6 F (37 C) (11/13 0630) Pulse Rate:  [72-92] 72 (11/13 0230) Cardiac Rhythm: Normal sinus rhythm (11/13 0700) Resp:  [13-20] 16 (11/13 0630) BP: (121-172)/(50-116) 140/68 (11/13 0630) SpO2:  [85 %-100 %] 98 % (11/13 0630) Weight:  [65.8 kg (145 lb)] 65.8 kg (145 lb) (11/12 1526)  CBC:  Recent Labs Lab 10/06/16 1547 10/06/16 1559 10/07/16 0706  WBC 16.4*  --  9.5  NEUTROABS 13.5*  --   --   HGB 13.5 13.3 12.2  HCT 38.8 39.0 36.2  MCV 93.9  --  96.8  PLT 267  --  [id number]    Basic Metabolic Panel:  Recent Labs Lab 10/06/16 1547 10/06/16 1559 10/07/16 0706  NA 137 138 140  K 3.8 3.8 3.8  CL 98* 100*  106  CO2 26  --  25  GLUCOSE 120* 112* 95  BUN 19 20 17   CREATININE 1.08* 1.10* 1.06*  CALCIUM 9.8  --  9.0    Lipid Panel:    Component Value Date/Time   CHOL 154 10/07/2016 0706   TRIG 108 10/07/2016 0706   HDL 52 10/07/2016 0706   CHOLHDL 3.0 10/07/2016 0706   VLDL 22 10/07/2016 0706   LDLCALC 80 10/07/2016 0706   HgbA1c: No results found for: HGBA1C Urine Drug Screen:    Component Value Date/Time   LABOPIA NONE DETECTED 10/06/2016 1535   COCAINSCRNUR NONE DETECTED 10/06/2016 1535   LABBENZ POSITIVE (A) 10/06/2016 1535   AMPHETMU NONE DETECTED 10/06/2016 1535   THCU NONE DETECTED 10/06/2016 1535   LABBARB NONE DETECTED 10/06/2016 1535      IMAGING  Ct Head Wo Contrast  Result Date: 10/06/2016 CLINICAL DATA:  Altered mental status. Patient is status post fall with trauma to the left side of her head. EXAM: CT HEAD WITHOUT CONTRAST CT CERVICAL SPINE WITHOUT CONTRAST TECHNIQUE: Multidetector CT imaging of the head and cervical spine was performed following the standard protocol without intravenous contrast. Multiplanar CT image reconstructions of the cervical spine were also generated. COMPARISON:  None. FINDINGS: CT HEAD FINDINGS Brain: No evidence of acute hemorrhage, hydrocephalus, extra-axial collection or mass lesion/mass effect. Subtle area of hypoattenuation in the subcortical left frontoparietal lobe may represent an acute ischemic infarction.  Moderate brain parenchymal volume loss and microangiopathy noted. Vascular: Atherosclerotic calcifications at the skullbase. Skull: Normal. Negative for fracture or focal lesion. Sinuses/Orbits: No acute finding. Other: None. CT CERVICAL SPINE FINDINGS The study is degraded by motion artifact. Alignment: Reversal of physiologic lordosis likely secondary to extensive multifocal osteoarthritic changes of the cervical spine. Skull base and vertebrae: No evidence of fracture. Soft tissues and spinal canal: Ligamentous thickening posterior  to the dens, likely degenerative. Disc levels: Moderate to severe osteoarthritic changes with disc space narrowing, remodeling of vertebral bodies, osteophyte formation. Similarly posterior facet arthropathy at all levels. Upper chest: Negative. Other: None. IMPRESSION: Area of hypoattenuation in the subcortical left frontoparietal lobe may represent acute ischemic infarction. Moderate brain parenchymal volume loss and microangiopathy. No evidence of acute traumatic injury to the cervical spine. Extensive spondylosis of the cervical spine with reversal of physiologic lordosis. Please note that the cervical spine CT is degraded by motion artifact due to patient's inability to cooperate. These results were called by telephone at the time of interpretation on 10/06/2016 at 6:25 pm to Dr. Shirlyn Goltz , who verbally acknowledged these results. Electronically Signed   By: Fidela Salisbury M.D.   On: 10/06/2016 18:28   Ct Cervical Spine Wo Contrast  Result Date: 10/06/2016 CLINICAL DATA:  Altered mental status. Patient is status post fall with trauma to the left side of her head. EXAM: CT HEAD WITHOUT CONTRAST CT CERVICAL SPINE WITHOUT CONTRAST TECHNIQUE: Multidetector CT imaging of the head and cervical spine was performed following the standard protocol without intravenous contrast. Multiplanar CT image reconstructions of the cervical spine were also generated. COMPARISON:  None. FINDINGS: CT HEAD FINDINGS Brain: No evidence of acute hemorrhage, hydrocephalus, extra-axial collection or mass lesion/mass effect. Subtle area of hypoattenuation in the subcortical left frontoparietal lobe may represent an acute ischemic infarction. Moderate brain parenchymal volume loss and microangiopathy noted. Vascular: Atherosclerotic calcifications at the skullbase. Skull: Normal. Negative for fracture or focal lesion. Sinuses/Orbits: No acute finding. Other: None. CT CERVICAL SPINE FINDINGS The study is degraded by motion  artifact. Alignment: Reversal of physiologic lordosis likely secondary to extensive multifocal osteoarthritic changes of the cervical spine. Skull base and vertebrae: No evidence of fracture. Soft tissues and spinal canal: Ligamentous thickening posterior to the dens, likely degenerative. Disc levels: Moderate to severe osteoarthritic changes with disc space narrowing, remodeling of vertebral bodies, osteophyte formation. Similarly posterior facet arthropathy at all levels. Upper chest: Negative. Other: None. IMPRESSION: Area of hypoattenuation in the subcortical left frontoparietal lobe may represent acute ischemic infarction. Moderate brain parenchymal volume loss and microangiopathy. No evidence of acute traumatic injury to the cervical spine. Extensive spondylosis of the cervical spine with reversal of physiologic lordosis. Please note that the cervical spine CT is degraded by motion artifact due to patient's inability to cooperate. These results were called by telephone at the time of interpretation on 10/06/2016 at 6:25 pm to Dr. Shirlyn Goltz , who verbally acknowledged these results. Electronically Signed   By: Fidela Salisbury M.D.   On: 10/06/2016 18:28   Mr Brain Wo Contrast  Result Date: 10/07/2016 CLINICAL DATA:  Found on floor.  Possible stroke. EXAM: MRI HEAD WITHOUT CONTRAST MRA HEAD WITHOUT CONTRAST TECHNIQUE: Multiplanar, multiecho pulse sequences of the brain and surrounding structures were obtained without intravenous contrast. Angiographic images of the head were obtained using MRA technique without contrast. COMPARISON:  Head CT from yesterday. FINDINGS: MRI HEAD FINDINGS Brain: No acute infarction, hemorrhage, hydrocephalus, extra-axial collection or mass lesion. Ventriculomegaly that  is likely central predominant atrophy. Mesial temporal volume loss is mild-to-moderate. No third ventricular ballooning. Small-vessel ischemic changes in cerebral white matter, with a thin confluent band  around the lateral ventricles. Vascular: Normal flow voids. Skull and upper cervical spine: Hypo intense appearance of the dens on T1 weighted imaging is from sclerosis. No focal marrow lesion. Sinuses/Orbits: Cataract resection. Other: Motion degraded study, best obtainable in a setting. MRA HEAD FINDINGS Tortuous right cervical ICA. Symmetric carotid arteries and branching. No major branch occlusion or flow limiting stenosis. No unusual vertebrobasilar branching. No flow limiting stenosis or major branch occlusion. 2 mm outpouching directed inferiorly from the right supraclinoid ICA, with no visible neighboring vessel. IMPRESSION: 1. No acute finding, including infarct. 2. Chronic microvascular disease and atrophy. 3. No acute finding on intracranial MRA.  No flow limiting stenosis. 4. 2 mm right supraclinoid ICA aneurysm or infundibulum. Electronically Signed   By: Monte Fantasia M.D.   On: 10/07/2016 06:44   Dg Chest Port 1 View  Result Date: 10/06/2016 CLINICAL DATA:  Altered mental status EXAM: PORTABLE CHEST 1 VIEW COMPARISON:  03/21/2014 FINDINGS: The heart size and mediastinal contours are within normal limits. Both lungs are clear. The visualized skeletal structures are unremarkable. IMPRESSION: No active disease. Electronically Signed   By: Kathreen Devoid   On: 10/06/2016 16:15   Mr Jodene Nam Head/brain Wo Cm  Result Date: 10/07/2016 CLINICAL DATA:  Found on floor.  Possible stroke. EXAM: MRI HEAD WITHOUT CONTRAST MRA HEAD WITHOUT CONTRAST TECHNIQUE: Multiplanar, multiecho pulse sequences of the brain and surrounding structures were obtained without intravenous contrast. Angiographic images of the head were obtained using MRA technique without contrast. COMPARISON:  Head CT from yesterday. FINDINGS: MRI HEAD FINDINGS Brain: No acute infarction, hemorrhage, hydrocephalus, extra-axial collection or mass lesion. Ventriculomegaly that is likely central predominant atrophy. Mesial temporal volume loss is  mild-to-moderate. No third ventricular ballooning. Small-vessel ischemic changes in cerebral white matter, with a thin confluent band around the lateral ventricles. Vascular: Normal flow voids. Skull and upper cervical spine: Hypo intense appearance of the dens on T1 weighted imaging is from sclerosis. No focal marrow lesion. Sinuses/Orbits: Cataract resection. Other: Motion degraded study, best obtainable in a setting. MRA HEAD FINDINGS Tortuous right cervical ICA. Symmetric carotid arteries and branching. No major branch occlusion or flow limiting stenosis. No unusual vertebrobasilar branching. No flow limiting stenosis or major branch occlusion. 2 mm outpouching directed inferiorly from the right supraclinoid ICA, with no visible neighboring vessel. IMPRESSION: 1. No acute finding, including infarct. 2. Chronic microvascular disease and atrophy. 3. No acute finding on intracranial MRA.  No flow limiting stenosis. 4. 2 mm right supraclinoid ICA aneurysm or infundibulum. Electronically Signed   By: Monte Fantasia M.D.   On: 10/07/2016 06:44     PHYSICAL EXAM Pleasant elderly Caucasian lady not in distress. . Afebrile. Head is nontraumatic. Neck is supple without bruit.    Cardiac exam no murmur or gallop. Lungs are clear to auscultation. Distal pulses are well felt. Neurological Exam ;  Awake  Alert oriented x 3. Normal speech and language.eye movements full without nystagmus.fundi were not visualized. Vision acuity and fields appear normal. Hearing is normal. Palatal movements are normal. Face symmetric. Tongue midline. Normal strength, tone, reflexes and coordination. Normal sensation. Gait deferred.  ASSESSMENT/PLAN Ms. ZELDA DENK is a 82 y.o. female with history of  HTN, hypothyroidism, previous CVA and DM type II found down in her kitchen presenting with confusion. She did not receive IV t-PA.  Syncopal episode, no stroke  MRI  No acute stroke. small vessel disease   MRA  No flow  reducing stenosis. R ICA 72mm supraclinoid aneurysm  Carotid Doppler  pending   2D Echo  pending   EEG pending   LDL 80  HgbA1c pending  Lovenox 40 mg sq daily for VTE prophylaxis  Diet NPO time specified  No antithrombotic prior to admission, now on aspirin 325 mg daily  Therapy recommendations:  pending   Disposition:  pending   Essential Hypertension  Elevated, as high as 172/116 past 24h  Hyperlipidemia  Home meds:  Zocor 20  zocor increased to 40 in hospital  LDL 80  Continue statin at discharge  Diabetes Type II  HgbA1c pending   Other Stroke Risk Factors  Advanced age  ETOH use, advised to drink no more than 1 drink(s) a day  Hx stroke/TIA  Other Active Problems  Rhabdomylosis  Hypothyroidism   GERD  Hx L breast cancer 10 yrs ago  Hospital day # 0  BIBY,SHARON  [place] for Pager information 10/07/2016 10:50 AM  I have personally examined this patient, reviewed notes, independently viewed imaging studies, participated in medical decision making and plan of care.ROS completed by me personally and pertinent positives fully documented  I have made any additions or clarifications directly to the above note. Agree with note above.  She presented with witnessed fall at likely from a syncopal episode given lack of objective focal findings history or exam. MRI scan is negative for acute infarct. Recommend check EEG for seizure activity and cardiac workup for syncope and monitoring for arrhythmia. Discuss with patient's family and with Dr. Eliseo Squires. Greater than 50% time during this 35 minute visit was spent on counseling and coordination of care throughout stroke and syncope Antony Contras, [place] Pager: [phone] 10/07/2016 1:59 PM  To contact Stroke Continuity provider, please refer to [email or web address] After hours, contact General Neurology

## 2016-10-07 NOTE — Progress Notes (Signed)
PROGRESS NOTE    [name]  [id number] DOB: 05-Oct-1934 DOA: 10/06/2016 PCP: Mathews Argyle, MD   Outpatient Specialists:    Brief Narrative:  [name] is a 82 y.o. 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 Much of the history is obtained by the patient's sister as the patient is currently unable to provide her own history at this time. At baseline the patient was noted to be very independent and lived alone. Patient was last seen normal sometime around 12 noon yesterday. She had been taken to the urgent care for nausea vomiting symptoms and prescribed promethazine.  Patient had been suffering from nausea and vomiting since January of this year intermittently. However, over the last 6 weeks or so symptoms have been progressively worse. She had been evaluated by her primary care provider with MRI of the abdomen showed no overt cause symptoms. Patient was scheduled to have a upper GI series performed tomorrow. The patient's sister notes that she had been trying to call her last night, but the patient did not answer. When she found her this morning she was still in her pajamas, her leg was jerking, and by the appearance of the kitchen it seemed as if she had been trying to feed the cat. The patient had complained of some diarrhea/constipation and approximately 40 pound weight loss since the beginning of the year.     Assessment & Plan:   Principal Problem:   Stroke [facility]) Active Problems:   Aphasia   Leukocytosis   Essential hypertension   Hypothyroidism   GERD (gastroesophageal reflux disease)   Hyperlipemia   Possible Stroke/ expressive aphasia: Acute/subacute.  - Admit to telemetry  - Stroke orderset initiated - Neuro checks - MRI negative - Check hemoglobin A1c and lipid panel in a.m. -  ASA - EEG - Appreciate neurology consultation will follow-up for further recommendations -patient sedated from ativan ? Suicide attempt-- told sister that she "wished she had died"-- psych consult when awake  Abdominal pain/nausea -UGI with KUB to be done when patient awake -being worked up by Dr. Paulita Fujita  Rhabdomyolysis: Acute. Patient has mild rhabdomyolysis with initial CPK elevated to 2341 as patient was found down in her home. - IV fluids normal saline at 100 ml/hr - recheck CPK in a.m.   Leukocytosis: WBC elevated at 16.4 on admission. Patient afebrile with vital signs within normal limits, chest x-ray negative for any signs of infiltrate, and urinalysis negative for any signs of infection. Worsening leukocytosis reactive versus possibility of infection. - Recheck CBC in a.m.   Essential HTN - continue losartan and felodipine   Hypothyroidism - TSH ok - continue Levothyroxine  Diabetes mellitus type II - hypoglycemia protocol - Held metformin - CBGs every before meals with sensitive sliding scale insulin  Hyperlipidemia - Continue simvastatin   Jerrye Bushy - continue protonix    DVT prophylaxis:  Lovenox   Code Status: Full Code   Family Communication:   Disposition Plan:     Consultants:        Subjective: Sleeping-- given ativan for MRI  Objective: Vitals:   10/07/16 0230 10/07/16 0430 10/07/16 0630 10/07/16 1026  BP: 138/68 134/68 140/68 (!) 167/72  Pulse: 72   85  Resp: 16 16 16 16   Temp: 98 F (36.7 C) 98 F (36.7 C) 98.6  F (37 C) 98.1 F (36.7 C)  TempSrc: Oral Oral Oral Oral  SpO2: 99% 98% 98% 98%  Weight:      Height:        Intake/Output Summary (Last 24 hours) at 10/07/16 1042 Last data filed at 10/06/16 2027  Gross per 24 hour  Intake             1000 ml  Output              400 ml  Net              600 ml   Filed Weights   10/06/16 1526  Weight: 65.8 kg (145 lb)    Examination:  General exam: sleeping    Respiratory system: Clear to auscultation. Respiratory effort normal. Cardiovascular system: S1 & S2 heard, RRR. No JVD, murmurs, rubs, gallops or clicks. No pedal edema. Gastrointestinal system: Abdomen is nondistended, soft and nontender. No organomegaly or masses felt. Normal bowel sounds heard. Central nervous system: sleeping     Data Reviewed: I have personally reviewed following labs and imaging studies  CBC:  Recent Labs Lab 10/06/16 1547 10/06/16 1559 10/07/16 0706  WBC 16.4*  --  9.5  NEUTROABS 13.5*  --   --   HGB 13.5 13.3 12.2  HCT 38.8 39.0 36.2  MCV 93.9  --  96.8  PLT 267  --  [id number]   Basic Metabolic Panel:  Recent Labs Lab 10/06/16 1547 10/06/16 1559 10/07/16 0706  NA 137 138 140  K 3.8 3.8 3.8  CL 98* 100* 106  CO2 26  --  25  GLUCOSE 120* 112* 95  BUN 19 20 17   CREATININE 1.08* 1.10* 1.06*  CALCIUM 9.8  --  9.0   GFR: Estimated Creatinine Clearance: 39.5 mL/min (by C-G formula based on SCr of 1.06 mg/dL (H)). Liver Function Tests:  Recent Labs Lab 10/06/16 1547  AST 50*  ALT 21  ALKPHOS 71  BILITOT 1.0  PROT 7.3  ALBUMIN 4.3   No results for input(s): LIPASE, AMYLASE in the last 168 hours. No results for input(s): AMMONIA in the last 168 hours. Coagulation Profile:  Recent Labs Lab 10/06/16 1547  INR 0.96   Cardiac Enzymes:  Recent Labs Lab 10/06/16 1547 10/07/16 0706  CKTOTAL 2,341* 1,584*   BNP (last 3 results) No results for input(s): PROBNP in the last 8760 hours. HbA1C: No results for input(s): HGBA1C in the last 72 hours. CBG:  Recent Labs Lab 10/06/16 2244 10/07/16 0644  GLUCAP 129* 94   Lipid Profile:  Recent Labs  10/07/16 0706  CHOL 154  HDL 52  LDLCALC 80  TRIG 108  CHOLHDL 3.0   Thyroid Function Tests:  Recent Labs  10/07/16 0706  TSH 1.510   Anemia Panel: No results for input(s): VITAMINB12, FOLATE, FERRITIN, TIBC, IRON, RETICCTPCT in the last 72 hours. Urine analysis:    Component  Value Date/Time   COLORURINE YELLOW 10/06/2016 [place] 10/06/2016 1535   LABSPEC 1.015 10/06/2016 1535   PHURINE 6.0 10/06/2016 1535   GLUCOSEU >1000 (A) 10/06/2016 1535   HGBUR TRACE (A) 10/06/2016 1535   BILIRUBINUR NEGATIVE 10/06/2016 1535   KETONESUR 15 (A) 10/06/2016 1535   PROTEINUR 100 (A) 10/06/2016 1535   NITRITE NEGATIVE 10/06/2016 1535   LEUKOCYTESUR NEGATIVE 10/06/2016 1535     )No results found for this or any previous visit (from the past 240 hour(s)).    Anti-infectives    None  Radiology Studies: Ct Head Wo Contrast  Result Date: 10/06/2016 CLINICAL DATA:  Altered mental status. Patient is status post fall with trauma to the left side of her head. EXAM: CT HEAD WITHOUT CONTRAST CT CERVICAL SPINE WITHOUT CONTRAST TECHNIQUE: Multidetector CT imaging of the head and cervical spine was performed following the standard protocol without intravenous contrast. Multiplanar CT image reconstructions of the cervical spine were also generated. COMPARISON:  None. FINDINGS: CT HEAD FINDINGS Brain: No evidence of acute hemorrhage, hydrocephalus, extra-axial collection or mass lesion/mass effect. Subtle area of hypoattenuation in the subcortical left frontoparietal lobe may represent an acute ischemic infarction. Moderate brain parenchymal volume loss and microangiopathy noted. Vascular: Atherosclerotic calcifications at the skullbase. Skull: Normal. Negative for fracture or focal lesion. Sinuses/Orbits: No acute finding. Other: None. CT CERVICAL SPINE FINDINGS The study is degraded by motion artifact. Alignment: Reversal of physiologic lordosis likely secondary to extensive multifocal osteoarthritic changes of the cervical spine. Skull base and vertebrae: No evidence of fracture. Soft tissues and spinal canal: Ligamentous thickening posterior to the dens, likely degenerative. Disc levels: Moderate to severe osteoarthritic changes with disc space narrowing,  remodeling of vertebral bodies, osteophyte formation. Similarly posterior facet arthropathy at all levels. Upper chest: Negative. Other: None. IMPRESSION: Area of hypoattenuation in the subcortical left frontoparietal lobe may represent acute ischemic infarction. Moderate brain parenchymal volume loss and microangiopathy. No evidence of acute traumatic injury to the cervical spine. Extensive spondylosis of the cervical spine with reversal of physiologic lordosis. Please note that the cervical spine CT is degraded by motion artifact due to patient's inability to cooperate. These results were called by telephone at the time of interpretation on 10/06/2016 at 6:25 pm to Dr. Shirlyn Goltz , who verbally acknowledged these results. Electronically Signed   By: Fidela Salisbury M.D.   On: 10/06/2016 18:28   Ct Cervical Spine Wo Contrast  Result Date: 10/06/2016 CLINICAL DATA:  Altered mental status. Patient is status post fall with trauma to the left side of her head. EXAM: CT HEAD WITHOUT CONTRAST CT CERVICAL SPINE WITHOUT CONTRAST TECHNIQUE: Multidetector CT imaging of the head and cervical spine was performed following the standard protocol without intravenous contrast. Multiplanar CT image reconstructions of the cervical spine were also generated. COMPARISON:  None. FINDINGS: CT HEAD FINDINGS Brain: No evidence of acute hemorrhage, hydrocephalus, extra-axial collection or mass lesion/mass effect. Subtle area of hypoattenuation in the subcortical left frontoparietal lobe may represent an acute ischemic infarction. Moderate brain parenchymal volume loss and microangiopathy noted. Vascular: Atherosclerotic calcifications at the skullbase. Skull: Normal. Negative for fracture or focal lesion. Sinuses/Orbits: No acute finding. Other: None. CT CERVICAL SPINE FINDINGS The study is degraded by motion artifact. Alignment: Reversal of physiologic lordosis likely secondary to extensive multifocal osteoarthritic changes of the  cervical spine. Skull base and vertebrae: No evidence of fracture. Soft tissues and spinal canal: Ligamentous thickening posterior to the dens, likely degenerative. Disc levels: Moderate to severe osteoarthritic changes with disc space narrowing, remodeling of vertebral bodies, osteophyte formation. Similarly posterior facet arthropathy at all levels. Upper chest: Negative. Other: None. IMPRESSION: Area of hypoattenuation in the subcortical left frontoparietal lobe may represent acute ischemic infarction. Moderate brain parenchymal volume loss and microangiopathy. No evidence of acute traumatic injury to the cervical spine. Extensive spondylosis of the cervical spine with reversal of physiologic lordosis. Please note that the cervical spine CT is degraded by motion artifact due to patient's inability to cooperate. These results were called by telephone at the time of interpretation on 10/06/2016  at 6:25 pm to Dr. Shirlyn Goltz , who verbally acknowledged these results. Electronically Signed   By: Fidela Salisbury M.D.   On: 10/06/2016 18:28   Mr Brain Wo Contrast  Result Date: 10/07/2016 CLINICAL DATA:  Found on floor.  Possible stroke. EXAM: MRI HEAD WITHOUT CONTRAST MRA HEAD WITHOUT CONTRAST TECHNIQUE: Multiplanar, multiecho pulse sequences of the brain and surrounding structures were obtained without intravenous contrast. Angiographic images of the head were obtained using MRA technique without contrast. COMPARISON:  Head CT from yesterday. FINDINGS: MRI HEAD FINDINGS Brain: No acute infarction, hemorrhage, hydrocephalus, extra-axial collection or mass lesion. Ventriculomegaly that is likely central predominant atrophy. Mesial temporal volume loss is mild-to-moderate. No third ventricular ballooning. Small-vessel ischemic changes in cerebral white matter, with a thin confluent band around the lateral ventricles. Vascular: Normal flow voids. Skull and upper cervical spine: Hypo intense appearance of the dens  on T1 weighted imaging is from sclerosis. No focal marrow lesion. Sinuses/Orbits: Cataract resection. Other: Motion degraded study, best obtainable in a setting. MRA HEAD FINDINGS Tortuous right cervical ICA. Symmetric carotid arteries and branching. No major branch occlusion or flow limiting stenosis. No unusual vertebrobasilar branching. No flow limiting stenosis or major branch occlusion. 2 mm outpouching directed inferiorly from the right supraclinoid ICA, with no visible neighboring vessel. IMPRESSION: 1. No acute finding, including infarct. 2. Chronic microvascular disease and atrophy. 3. No acute finding on intracranial MRA.  No flow limiting stenosis. 4. 2 mm right supraclinoid ICA aneurysm or infundibulum. Electronically Signed   By: Monte Fantasia M.D.   On: 10/07/2016 06:44   Dg Chest Port 1 View  Result Date: 10/06/2016 CLINICAL DATA:  Altered mental status EXAM: PORTABLE CHEST 1 VIEW COMPARISON:  03/21/2014 FINDINGS: The heart size and mediastinal contours are within normal limits. Both lungs are clear. The visualized skeletal structures are unremarkable. IMPRESSION: No active disease. Electronically Signed   By: Kathreen Devoid   On: 10/06/2016 16:15   Mr Jodene Nam Head/brain Wo Cm  Result Date: 10/07/2016 CLINICAL DATA:  Found on floor.  Possible stroke. EXAM: MRI HEAD WITHOUT CONTRAST MRA HEAD WITHOUT CONTRAST TECHNIQUE: Multiplanar, multiecho pulse sequences of the brain and surrounding structures were obtained without intravenous contrast. Angiographic images of the head were obtained using MRA technique without contrast. COMPARISON:  Head CT from yesterday. FINDINGS: MRI HEAD FINDINGS Brain: No acute infarction, hemorrhage, hydrocephalus, extra-axial collection or mass lesion. Ventriculomegaly that is likely central predominant atrophy. Mesial temporal volume loss is mild-to-moderate. No third ventricular ballooning. Small-vessel ischemic changes in cerebral white matter, with a thin confluent  band around the lateral ventricles. Vascular: Normal flow voids. Skull and upper cervical spine: Hypo intense appearance of the dens on T1 weighted imaging is from sclerosis. No focal marrow lesion. Sinuses/Orbits: Cataract resection. Other: Motion degraded study, best obtainable in a setting. MRA HEAD FINDINGS Tortuous right cervical ICA. Symmetric carotid arteries and branching. No major branch occlusion or flow limiting stenosis. No unusual vertebrobasilar branching. No flow limiting stenosis or major branch occlusion. 2 mm outpouching directed inferiorly from the right supraclinoid ICA, with no visible neighboring vessel. IMPRESSION: 1. No acute finding, including infarct. 2. Chronic microvascular disease and atrophy. 3. No acute finding on intracranial MRA.  No flow limiting stenosis. 4. 2 mm right supraclinoid ICA aneurysm or infundibulum. Electronically Signed   By: Monte Fantasia M.D.   On: 10/07/2016 06:44        Scheduled Meds: . aspirin  325 mg Oral Daily  . atorvastatin  40  mg Oral q1800  . enoxaparin (LOVENOX) injection  40 mg Subcutaneous Q24H  . felodipine  5 mg Oral Daily  . insulin aspart  0-9 Units Subcutaneous TID WC  . levothyroxine  50 mcg Oral QAC breakfast  . losartan  100 mg Oral Daily  . pantoprazole  40 mg Oral Daily   Continuous Infusions: . sodium chloride 75 mL/hr at 10/06/16 2155     LOS: 0 days    Time spent: 25 min    [place], DO [facility] Pager [phone]  If 7PM-7AM, please contact night-coverage [email or web address] Password [email or web address] 10/07/2016, 10:42 AM

## 2016-10-07 NOTE — Evaluation (Signed)
Clinical/Bedside Swallow Evaluation Patient Details  Name: [name] MRN: [id number] Date of Birth: 21-Jun-1934  Today's Date: 10/07/2016 Time: SLP Start Time (ACUTE ONLY): 1219 SLP Stop Time (ACUTE ONLY): 1238 SLP Time Calculation (min) (ACUTE ONLY): 19 min  Past Medical History:  Past Medical History:  Diagnosis Date  . Arthritis   . Cancer [facility])    breast cancer on left - 10  years ago  . Diabetes mellitus without complication ([place])   . GERD (gastroesophageal reflux disease)   . Hypertension   . Hypothyroidism   . Stroke ([place])    2014  . Wears glasses    Past Surgical History:  Past Surgical History:  Procedure Laterality Date  . ABDOMINAL HYSTERECTOMY    . APPENDECTOMY    . ARCUATE KERATECTOMY    . BUNIONECTOMY     right  . CARPAL TUNNEL RELEASE     right  . CARPAL TUNNEL RELEASE Left 05/24/2014   Procedure: LEFT CARPAL TUNNEL RELEASE LEFT THUMB TRIGGER FINGER RELEASE;  Surgeon: Cammie Sickle, MD;  Location: [place];  Service: Orthopedics;  Laterality: Left;  left thumb also site of incision  . DEBRIDEMENT OF ABDOMINAL WALL ABSCESS  1978  . OVARIAN CYST SURGERY     x2-  . TONSILLECTOMY    . TRIGGER FINGER RELEASE Left 01/25/2016   Procedure: RELEASE A-1 PULLEY LEFT RING FINGER, LEFT SMALL FINGER;  Surgeon: Daryll Brod, MD;  Location: [place];  Service: Orthopedics;  Laterality: Left;  ANESTHESIA: IV REGIONAL FAB   HPI:  [name] a 82 y.o.femalewith medical history significant of HTN, hypothyroidism, GERD, CVA w/ possible residual tremor right side, and DM type II who was found facedown on the kitchen floor of her home. Per chart patient has been suffering from nausea and vomiting since January of this year intermittently. Abdominal MRI showed no overt cause symptoms and was scheduled to have a upper GI series performed 11/13 (approximately 40 pound weight loss since the beginning of the year). MRI No acute finding,  including infarct, chronic microvascular disease and atrophy and 2 mm right supraclinoid ICA aneurysm or infundibulum. CXR no active disease.   Assessment / Plan / Recommendation Clinical Impression  Oral and pharyngeal phases of swallow appeared within functional limits without s/s aspiration. Pt/daughter?SLP discussed recent weight loss and abdominal concerns however pt/family denied prior swallow difficulty. Pt appeared mildy confused although MRI did not detect acute infarct or hemorrhage. Recommend regular texture, thin liquids, pills with thin and straws allowed. No follow up ST needed.      Aspiration Risk  Mild aspiration risk    Diet Recommendation Regular;Thin liquid   Liquid Administration via: Straw;Cup Medication Administration: Whole meds with liquid Supervision: Patient able to self feed Compensations: Slow rate;Small sips/bites Postural Changes: Seated upright at 90 degrees    Other  Recommendations Oral Care Recommendations: Oral care BID   Follow up Recommendations None      Frequency and Duration            Prognosis        Swallow Study   General HPI: [name] a 82 y.o.femalewith medical history significant of HTN, hypothyroidism, GERD, CVA w/ possible residual tremor right side, and DM type II who was found facedown on the kitchen floor of her home. Per chart patient has been suffering from nausea and vomiting since January of this year intermittently. Abdominal MRI showed no overt cause symptoms and was scheduled  to have a upper GI series performed 11/13 (approximately 40 pound weight loss since the beginning of the year). MRI No acute finding, including infarct, chronic microvascular disease and atrophy and 2 mm right supraclinoid ICA aneurysm or infundibulum. CXR no active disease. Type of Study: Bedside Swallow Evaluation Previous Swallow Assessment:  (none) Diet Prior to this Study: NPO Temperature Spikes Noted: No Respiratory Status: Room  air History of Recent Intubation: No Behavior/Cognition: Alert;Cooperative;Pleasant mood;Confused;Requires cueing Oral Cavity Assessment: Within Functional Limits Oral Care Completed by SLP: No Oral Cavity - Dentition: Adequate natural dentition Vision: Functional for self-feeding Self-Feeding Abilities: Able to feed self Patient Positioning: Upright in bed Baseline Vocal Quality: Normal Volitional Cough: Strong Volitional Swallow: Able to elicit    Oral/Motor/Sensory Function Overall Oral Motor/Sensory Function: Within functional limits   Ice Chips Ice chips: Not tested   Thin Liquid Thin Liquid: Within functional limits Presentation: Cup;Straw    Nectar Thick Nectar Thick Liquid: Not tested   Honey Thick Honey Thick Liquid: Not tested   Puree Puree: Within functional limits   Solid   GO   Solid: Within functional limits    Functional Assessment Tool Used: skilled clinical judgement Functional Limitations: Swallowing Swallow Current Status [id number]): 0 percent impaired, limited or restricted Swallow Goal Status [id number]): 0 percent impaired, limited or restricted Swallow Discharge Status [phone]): 0 percent impaired, limited or restricted   Houston Siren 10/07/2016,2:40 PM   Orbie Pyo Colvin Caroli.Ed [employer] [phone]

## 2016-10-07 NOTE — Progress Notes (Signed)
*  PRELIMINARY RESULTS* Vascular Ultrasound Carotid Duplex (Doppler) has been completed.  Preliminary findings: Findings consistent with a 1- 14 percent stenosis involving the right internal carotid artery by end diastolic velocity, [id number] stenosis by peak systolic velocity.  Findings consistent with 1-39 percent stenosis involving the left internal carotid artery.  Bilateral vertebral arteries are patent and antegrade.  [name] 10/07/2016, 4:02 PM

## 2016-10-07 NOTE — Procedures (Signed)
ELECTROENCEPHALOGRAM REPORT  Date of Study: 10/07/2016  Patient's Name: [name] MRN: [id number] Date of Birth: 06-02-1934  Referring Provider: Tomi Bamberger. Vann, DO  Clinical History: 82 y.o.femalewith medical history significant of HTN, hypothyroidism, CVA w/ possible residual tremor right side, and DM type II; who was found facedown on the kitchen floor of her home   Medications: Hospital Medications  0.9 % sodium chloride infusion   ALPRAZolam (XANAX) tablet 0.25 mg   aspirin tablet 325 mg   atorvastatin (LIPITOR) tablet 40 mg   enoxaparin (LOVENOX) injection 40 mg   felodipine (PLENDIL) 24 hr tablet 5 mg   insulin aspart (novoLOG) injection 0-9 Units   ipratropium-albuterol (DUONEB) 0.5-2.5 (3) MG/3ML nebulizer solution 3 mL   levothyroxine (SYNTHROID, LEVOTHROID) tablet 50 mcg   losartan (COZAAR) tablet 100 mg   ondansetron (ZOFRAN) injection 4 mg   pantoprazole (PROTONIX) EC tablet 40 mg   senna-docusate (Senokot-S) tablet 1 tablet  Outpatient Medications  ALPRAZolam (XANAX) 0.25 MG tablet   felodipine (PLENDIL) 5 MG 24 hr tablet   levothyroxine (SYNTHROID, LEVOTHROID) 50 MCG tablet   losartan (COZAAR) 100 MG tablet   metFORMIN (GLUCOPHAGE-XR) 750 MG 24 hr tablet   ondansetron (ZOFRAN) 8 MG tablet   promethazine (PHENERGAN) 25 MG tablet   Ascorbic Acid (VITAMIN C ER PO)   Coenzyme Q10 (CO Q-10) 100 MG CAPS   Fish Oil OIL   Multiple Vitamins-Minerals (MULTIVITAMIN WITH MINERALS) tablet   omeprazole (PRILOSEC) 20 MG capsule   Red Yeast Rice Extract (RED YEAST RICE PO)   senna-docusate (SENOKOT-S) 8.6-50 MG per tablet   simvastatin (ZOCOR) 20 MG tablet   traMADol (ULTRAM) 50 MG tablet   UNABLE TO FIND   vitamin B-12 (CYANOCOBALAMIN) 250 MCG tablet  Technical Summary: A multichannel digital EEG recording measured by the international 10-20 system with electrodes applied with paste and impedances below 5000 ohms performed as portable with EKG monitoring in an  awake and drowsy patient.  Hyperventilation and photic stimulation were not performed.  The digital EEG was referentially recorded, reformatted, and digitally filtered in a variety of bipolar and referential montages for optimal display.   Description: The patient is awake and drowsy during the recording.  During maximal wakefulness, there is a symmetric, low voltage 7-8 Hz posterior dominant rhythm that attenuates with eye opening. This is admixed with diffuse 4-5 Hz theta and 2-3 Hz delta slowing of the waking background. Stage 2 sleep was not seen.  There were no epileptiform discharges or electrographic seizures seen.    EKG lead was unremarkable.  Impression: This awake and drowsy EEG is abnormal due to diffuse slowing of the waking background.  Clinical Correlation of the above findings indicates diffuse cerebral dysfunction that is non-specific in etiology and can be seen with hypoxic/ischemic injury, toxic/metabolic encephalopathies, neurodegenerative disorders, or medication effect.  The absence of epileptiform discharges does not rule out a clinical diagnosis of epilepsy.  Clinical correlation is advised.   Metta Clines, DO

## 2016-10-07 NOTE — Progress Notes (Signed)
Occupational Therapy Evaluation Patient Details Name: [name] MRN: [id number] DOB: 1933-12-03 Today's Date: 10/07/2016    History of Present Illness 82 y.o. female with medical history significant of HTN, hypothyroidism, CVA w/ possible residual tremor right side, and DM type II; who was found facedown on the kitchen floor of her home CT head revealed three subtle areas of hypoattenuation in the subcortical left frontal lobe, possibly representing subacute ischemic infarctions.MRI did not detect acute infarct or hemorrhage   Clinical Impression   PTA, pt lived alone and was independent with ADL and mobility. Her husband has dementia and is in a SNF. Pt with significant functional decline, requiring mod A with ADL and Mobility at this time. Pt with apparent cognitive and visual perceptual deficits in addition to word finding deficits. Pt would benefit form rehab at [facility] to facilitate safe return home. Will follow acutely.     Follow Up Recommendations  Supervision/Assistance - 24 hour;CIR    Equipment Recommendations  3 in 1 bedside comode    Recommendations for Other Services Rehab consult     Precautions / Restrictions Precautions Precautions: Fall      Mobility Bed Mobility Overal bed mobility: Needs Assistance Bed Mobility: Supine to Sit     Supine to sit: Mod assist     General bed mobility comments: difficulty with problem solving on how to get to EOB  Transfers Overall transfer level: Needs assistance   Transfers: Sit to/from Stand;Stand Pivot Transfers Sit to Stand: Mod assist Stand pivot transfers: Mod assist       General transfer comment: Poor awareness of midline. posterior bias    Balance Overall balance assessment: Needs assistance   Sitting balance-Leahy Scale: Fair       Standing balance-Leahy Scale: Poor                              ADL Overall ADL's : Needs assistance/impaired Eating/Feeding: Minimal  assistance;Sitting   Grooming: Minimal assistance;Brushing hair;Wash/dry hands;Wash/dry face   Upper Body Bathing: Minimal assitance;Sitting   Lower Body Bathing: Moderate assistance;Sit to/from stand   Upper Body Dressing : Moderate assistance;Sitting   Lower Body Dressing: Moderate assistance;Sit to/from stand   Toilet Transfer: Moderate assistance;Stand-pivot   Toileting- Clothing Manipulation and Hygiene: Moderate assistance       Functional mobility during ADLs: Moderate assistance       Vision Vision Assessment?: Vision impaired- to be further tested in functional context Additional Comments: Poor visual attention. Pt seeing spiders in room   Perception Perception Comments: Will further assess   Praxis Praxis Praxis tested?: Deficits Deficits: Organization    Pertinent Vitals/Pain Pain Assessment: Faces Faces Pain Scale: Hurts even more Pain Location: low back/head Pain Descriptors / Indicators: Discomfort;Grimacing Pain Intervention(s): Limited activity within patient's tolerance     Hand Dominance Right   Extremity/Trunk Assessment Upper Extremity Assessment Upper Extremity Assessment: RUE deficits/detail RUE Deficits / Details: motor planning difficulty at times. tremor at baseline RUE Coordination: decreased fine motor   Lower Extremity Assessment Lower Extremity Assessment: Defer to PT evaluation;Generalized weakness   Cervical / Trunk Assessment Cervical / Trunk Assessment: Kyphotic   Communication Communication Communication: Expressive difficulties   Cognition Arousal/Alertness: Awake/alert Behavior During Therapy: Restless;Anxious Overall Cognitive Status: Impaired/Different from baseline Area of Impairment: Orientation;Attention;Memory;Following commands;Safety/judgement;Problem solving;Awareness Orientation Level: Disoriented to;Place;Time Current Attention Level: Sustained Memory: Decreased recall of precautions;Decreased short-term  memory Following Commands: Follows one step commands inconsistently Safety/Judgement: Decreased awareness  of safety;Decreased awareness of deficits Awareness: Intellectual Problem Solving: Slow processing;Difficulty sequencing;Requires verbal cues     General Comments       Exercises       Shoulder Instructions      Home Living Family/patient expects to be discharged to:: Private residence Living Arrangements: Alone Available Help at Discharge: Family;Friend(s);Available PRN/intermittently Type of Home: House Home Access: Level entry     Home Layout: One level     Bathroom Shower/Tub: Occupational psychologist: Standard Bathroom Accessibility: Yes How Accessible: Accessible via walker Home Equipment: Shower seat          Prior Functioning/Environment Level of Independence: Independent        Comments: Per sister still drives        OT Problem List: Decreased strength;Decreased activity tolerance;Impaired balance (sitting and/or standing);Impaired vision/perception;Decreased coordination;Decreased cognition;Decreased safety awareness;Decreased knowledge of use of DME or AE;Pain   OT Treatment/Interventions: Self-care/ADL training;Therapeutic exercise;Neuromuscular education;DME and/or AE instruction;Therapeutic activities;Cognitive remediation/compensation;Visual/perceptual remediation/compensation;Patient/family education;Balance training    OT Goals(Current goals can be found in the care plan section) Acute Rehab OT Goals Patient Stated Goal: pt/sister - to be independent OT Goal Formulation: With patient/family Time For Goal Achievement: 10/21/16  OT Frequency: Min 2X/week   Barriers to D/C:            Co-evaluation              End of Session Equipment Utilized During Treatment: Gait belt Nurse Communication: Mobility status  Activity Tolerance: Patient tolerated treatment well Patient left: in bed;with call bell/phone within  reach;with bed alarm set;with family/visitor present   Time: [id number] OT Time Calculation (min): 41 min Charges:  OT General Charges $OT Visit: 1 Procedure OT Evaluation $OT Eval Moderate Complexity: 1 Procedure OT Treatments $Self Care/Home Management : 23-37 mins G-Codes: OT G-codes **NOT FOR INPATIENT CLASS** Functional Assessment Tool Used: clinical judgement Functional Limitation: Self care Self Care Current Status [id number]): At least 40 percent but less than 60 percent impaired, limited or restricted Self Care Goal Status [id number]): At least 1 percent but less than 20 percent impaired, limited or restricted  Naftuli Dalsanto,HILLARY 10/07/2016, 5:17 PM   [facility], OTR/L  [phone] 10/07/2016

## 2016-10-07 NOTE — Progress Notes (Signed)
Patient taken for MRI

## 2016-10-07 NOTE — Progress Notes (Signed)
Patient returned from MRI.

## 2016-10-08 ENCOUNTER — Inpatient Hospital Stay (HOSPITAL_COMMUNITY): Payer: Medicare Other

## 2016-10-08 DIAGNOSIS — I639 Cerebral infarction, unspecified: Secondary | ICD-10-CM

## 2016-10-08 LAB — HEMOGLOBIN A1C
Hgb A1c MFr Bld: 6.2 % — ABNORMAL HIGH (ref 4.8–5.6)
MEAN PLASMA GLUCOSE: 131 mg/dL

## 2016-10-08 LAB — GLUCOSE, CAPILLARY
GLUCOSE-CAPILLARY: 88 mg/dL (ref 65–99)
Glucose-Capillary: 96 mg/dL (ref 65–99)
Glucose-Capillary: 160 mg/dL — ABNORMAL HIGH (ref 65–99)
Glucose-Capillary: 160 mg/dL — ABNORMAL HIGH (ref 65–99)

## 2016-10-08 LAB — CK: CK TOTAL: 681 U/L — AB (ref 38–234)

## 2016-10-08 MED ORDER — ONDANSETRON HCL 4 MG PO TABS
4.0000 mg | ORAL_TABLET | Freq: Three times a day (TID) | ORAL | Status: DC | PRN
  Administered 2016-10-08: 4 mg via ORAL
  Filled 2016-10-08: qty 1

## 2016-10-08 NOTE — Progress Notes (Signed)
STROKE TEAM PROGRESS NOTE   HISTORY OF PRESENT ILLNESS (per record) [name] is an 82 y.o. female who presented to the ED via EMS after being found down on a tile floor in her kitchen with evidence of trauma to the left side of her head. LKN at 12:00 yesterday 111/11/17. She was confused and babbling nonsensically on arrival.  She has a history of prior stroke in 2014. PMHx also includes hypothyroidism, HTN, breast CA and DM.   CT of cervical spine revealed no evidence of acute traumatic injury to the cervical spine. Extensive spondylosis of the cervical spine with reversal of physiologic lordosis was noted. CT head revealed three subtle areas of hypoattenuation in the subcortical left frontal lobe, possibly representing subacute ischemic infarctions. Moderate brain parenchymal volume loss and microangiopathy were also noted.  Patient was not administered IV t-PA. She was admitted for further evaluation and treatment.   SUBJECTIVE (INTERVAL HISTORY) Her grandneice is at the bedside.  Patient sleeping soundly after getting ativan. Grandneice states she was coherent earlier this am when she woke up. Overall she feels her condition is stable. She is a good historian. She reports one other recent passing out episode.    OBJECTIVE Temp:  [97.8 F (36.6 C)-99.2 F (37.3 C)] 97.8 F (36.6 C) (11/14 1352) Pulse Rate:  [70-87] 76 (11/14 1352) Cardiac Rhythm: Normal sinus rhythm (11/14 0700) Resp:  [18] 18 (11/14 1352) BP: (112-156)/(55-75) 122/60 (11/14 1352) SpO2:  [94 %-97 %] 94 % (11/14 1352)  CBC:   Recent Labs Lab 10/06/16 1547 10/06/16 1559 10/07/16 0706  WBC 16.4*  --  9.5  NEUTROABS 13.5*  --   --   HGB 13.5 13.3 12.2  HCT 38.8 39.0 36.2  MCV 93.9  --  96.8  PLT 267  --  [id number]    Basic Metabolic Panel:   Recent Labs Lab 10/06/16 1547 10/06/16 1559 10/07/16 0706  NA 137 138 140  K 3.8 3.8 3.8  CL 98* 100* 106  CO2 26  --  25  GLUCOSE 120* 112* 95  BUN 19 20  17   CREATININE 1.08* 1.10* 1.06*  CALCIUM 9.8  --  9.0    Lipid Panel:     Component Value Date/Time   CHOL 154 10/07/2016 0706   TRIG 108 10/07/2016 0706   HDL 52 10/07/2016 0706   CHOLHDL 3.0 10/07/2016 0706   VLDL 22 10/07/2016 0706   LDLCALC 80 10/07/2016 0706   HgbA1c:  Lab Results  Component Value Date   HGBA1C 6.2 (H) 10/07/2016   Urine Drug Screen:     Component Value Date/Time   LABOPIA NONE DETECTED 10/06/2016 1535   COCAINSCRNUR NONE DETECTED 10/06/2016 1535   LABBENZ POSITIVE (A) 10/06/2016 1535   AMPHETMU NONE DETECTED 10/06/2016 1535   THCU NONE DETECTED 10/06/2016 1535   LABBARB NONE DETECTED 10/06/2016 1535      IMAGING  Ct Head Wo Contrast  Result Date: 10/06/2016 CLINICAL DATA:  Altered mental status. Patient is status post fall with trauma to the left side of her head. EXAM: CT HEAD WITHOUT CONTRAST CT CERVICAL SPINE WITHOUT CONTRAST TECHNIQUE: Multidetector CT imaging of the head and cervical spine was performed following the standard protocol without intravenous contrast. Multiplanar CT image reconstructions of the cervical spine were also generated. COMPARISON:  None. FINDINGS: CT HEAD FINDINGS Brain: No evidence of acute hemorrhage, hydrocephalus, extra-axial collection or mass lesion/mass effect. Subtle area of hypoattenuation in the subcortical left frontoparietal lobe may represent an acute  ischemic infarction. Moderate brain parenchymal volume loss and microangiopathy noted. Vascular: Atherosclerotic calcifications at the skullbase. Skull: Normal. Negative for fracture or focal lesion. Sinuses/Orbits: No acute finding. Other: None. CT CERVICAL SPINE FINDINGS The study is degraded by motion artifact. Alignment: Reversal of physiologic lordosis likely secondary to extensive multifocal osteoarthritic changes of the cervical spine. Skull base and vertebrae: No evidence of fracture. Soft tissues and spinal canal: Ligamentous thickening posterior to the dens,  likely degenerative. Disc levels: Moderate to severe osteoarthritic changes with disc space narrowing, remodeling of vertebral bodies, osteophyte formation. Similarly posterior facet arthropathy at all levels. Upper chest: Negative. Other: None. IMPRESSION: Area of hypoattenuation in the subcortical left frontoparietal lobe may represent acute ischemic infarction. Moderate brain parenchymal volume loss and microangiopathy. No evidence of acute traumatic injury to the cervical spine. Extensive spondylosis of the cervical spine with reversal of physiologic lordosis. Please note that the cervical spine CT is degraded by motion artifact due to patient's inability to cooperate. These results were called by telephone at the time of interpretation on 10/06/2016 at 6:25 pm to Dr. Shirlyn Goltz , who verbally acknowledged these results. Electronically Signed   By: Fidela Salisbury M.D.   On: 10/06/2016 18:28   Ct Cervical Spine Wo Contrast  Result Date: 10/06/2016 CLINICAL DATA:  Altered mental status. Patient is status post fall with trauma to the left side of her head. EXAM: CT HEAD WITHOUT CONTRAST CT CERVICAL SPINE WITHOUT CONTRAST TECHNIQUE: Multidetector CT imaging of the head and cervical spine was performed following the standard protocol without intravenous contrast. Multiplanar CT image reconstructions of the cervical spine were also generated. COMPARISON:  None. FINDINGS: CT HEAD FINDINGS Brain: No evidence of acute hemorrhage, hydrocephalus, extra-axial collection or mass lesion/mass effect. Subtle area of hypoattenuation in the subcortical left frontoparietal lobe may represent an acute ischemic infarction. Moderate brain parenchymal volume loss and microangiopathy noted. Vascular: Atherosclerotic calcifications at the skullbase. Skull: Normal. Negative for fracture or focal lesion. Sinuses/Orbits: No acute finding. Other: None. CT CERVICAL SPINE FINDINGS The study is degraded by motion artifact. Alignment:  Reversal of physiologic lordosis likely secondary to extensive multifocal osteoarthritic changes of the cervical spine. Skull base and vertebrae: No evidence of fracture. Soft tissues and spinal canal: Ligamentous thickening posterior to the dens, likely degenerative. Disc levels: Moderate to severe osteoarthritic changes with disc space narrowing, remodeling of vertebral bodies, osteophyte formation. Similarly posterior facet arthropathy at all levels. Upper chest: Negative. Other: None. IMPRESSION: Area of hypoattenuation in the subcortical left frontoparietal lobe may represent acute ischemic infarction. Moderate brain parenchymal volume loss and microangiopathy. No evidence of acute traumatic injury to the cervical spine. Extensive spondylosis of the cervical spine with reversal of physiologic lordosis. Please note that the cervical spine CT is degraded by motion artifact due to patient's inability to cooperate. These results were called by telephone at the time of interpretation on 10/06/2016 at 6:25 pm to Dr. Shirlyn Goltz , who verbally acknowledged these results. Electronically Signed   By: Fidela Salisbury M.D.   On: 10/06/2016 18:28   Mr Brain Wo Contrast  Result Date: 10/07/2016 CLINICAL DATA:  Found on floor.  Possible stroke. EXAM: MRI HEAD WITHOUT CONTRAST MRA HEAD WITHOUT CONTRAST TECHNIQUE: Multiplanar, multiecho pulse sequences of the brain and surrounding structures were obtained without intravenous contrast. Angiographic images of the head were obtained using MRA technique without contrast. COMPARISON:  Head CT from yesterday. FINDINGS: MRI HEAD FINDINGS Brain: No acute infarction, hemorrhage, hydrocephalus, extra-axial collection or mass lesion.  Ventriculomegaly that is likely central predominant atrophy. Mesial temporal volume loss is mild-to-moderate. No third ventricular ballooning. Small-vessel ischemic changes in cerebral white matter, with a thin confluent band around the lateral  ventricles. Vascular: Normal flow voids. Skull and upper cervical spine: Hypo intense appearance of the dens on T1 weighted imaging is from sclerosis. No focal marrow lesion. Sinuses/Orbits: Cataract resection. Other: Motion degraded study, best obtainable in a setting. MRA HEAD FINDINGS Tortuous right cervical ICA. Symmetric carotid arteries and branching. No major branch occlusion or flow limiting stenosis. No unusual vertebrobasilar branching. No flow limiting stenosis or major branch occlusion. 2 mm outpouching directed inferiorly from the right supraclinoid ICA, with no visible neighboring vessel. IMPRESSION: 1. No acute finding, including infarct. 2. Chronic microvascular disease and atrophy. 3. No acute finding on intracranial MRA.  No flow limiting stenosis. 4. 2 mm right supraclinoid ICA aneurysm or infundibulum. Electronically Signed   By: Monte Fantasia M.D.   On: 10/07/2016 06:44   Dg Chest Port 1 View  Result Date: 10/06/2016 CLINICAL DATA:  Altered mental status EXAM: PORTABLE CHEST 1 VIEW COMPARISON:  03/21/2014 FINDINGS: The heart size and mediastinal contours are within normal limits. Both lungs are clear. The visualized skeletal structures are unremarkable. IMPRESSION: No active disease. Electronically Signed   By: Kathreen Devoid   On: 10/06/2016 16:15   Dg Duanne Limerick  W/kub  Result Date: 10/08/2016 CLINICAL DATA:  Difficulty swallowing.  Dysphagia. EXAM: UPPER GI SERIES WITH KUB TECHNIQUE: After obtaining a scout radiograph a routine upper GI series was performed using thin barium FLUOROSCOPY TIME:  Fluoroscopy Time:  1 minutes 18 seconds Radiation Exposure Index (if provided by the fluoroscopic device): 0.9 mGy Number of Acquired Spot Images: 21 COMPARISON:  MRI upper abdomen 09/17/2016 FINDINGS: Exam was limited due to patient immobility. Patient was examined in a 30 degree supine position. Initial KUB demonstrates no bowel obstruction. No mucosal irregularity stricture or mass of the  esophagus. The GE junction was widely patent. Mild gastroesophageal reflux. There is no mucosal irregularity, stricture or mass of the stomach. The duodenum bulb is normal. The C-loop and more distal third and fourth portion the duodenum is normal. IMPRESSION: 1. No esophageal stricture mass or obstruction. 2. Mild esophageal reflux. 3. Normal stomach without evidence of mass or obstruction. 4. Normal duodenum. 5. Exam is somewhat limited due to patient immobility and single contrast. Electronically Signed   By: Suzy Bouchard M.D.   On: 10/08/2016 10:28   Mr Jodene Nam Head/brain [id number] Cm  Result Date: 10/07/2016 CLINICAL DATA:  Found on floor.  Possible stroke. EXAM: MRI HEAD WITHOUT CONTRAST MRA HEAD WITHOUT CONTRAST TECHNIQUE: Multiplanar, multiecho pulse sequences of the brain and surrounding structures were obtained without intravenous contrast. Angiographic images of the head were obtained using MRA technique without contrast. COMPARISON:  Head CT from yesterday. FINDINGS: MRI HEAD FINDINGS Brain: No acute infarction, hemorrhage, hydrocephalus, extra-axial collection or mass lesion. Ventriculomegaly that is likely central predominant atrophy. Mesial temporal volume loss is mild-to-moderate. No third ventricular ballooning. Small-vessel ischemic changes in cerebral white matter, with a thin confluent band around the lateral ventricles. Vascular: Normal flow voids. Skull and upper cervical spine: Hypo intense appearance of the dens on T1 weighted imaging is from sclerosis. No focal marrow lesion. Sinuses/Orbits: Cataract resection. Other: Motion degraded study, best obtainable in a setting. MRA HEAD FINDINGS Tortuous right cervical ICA. Symmetric carotid arteries and branching. No major branch occlusion or flow limiting stenosis. No unusual vertebrobasilar branching. No flow limiting stenosis  or major branch occlusion. 2 mm outpouching directed inferiorly from the right supraclinoid ICA, with no visible  neighboring vessel. IMPRESSION: 1. No acute finding, including infarct. 2. Chronic microvascular disease and atrophy. 3. No acute finding on intracranial MRA.  No flow limiting stenosis. 4. 2 mm right supraclinoid ICA aneurysm or infundibulum. Electronically Signed   By: Monte Fantasia M.D.   On: 10/07/2016 06:44     PHYSICAL EXAM Pleasant elderly Caucasian lady not in distress. . Afebrile. Head is nontraumatic. Neck is supple without bruit.    Cardiac exam no murmur or gallop. Lungs are clear to auscultation. Distal pulses are well felt. Neurological Exam ;  Awake  Alert oriented x 3. Normal speech and language.eye movements full without nystagmus.fundi were not visualized. Vision acuity and fields appear normal. Hearing is normal. Palatal movements are normal. Face symmetric. Tongue midline. Normal strength, tone, reflexes and coordination. Normal sensation. Gait deferred.  ASSESSMENT/PLAN Ms. RASHEEN TYREE is a 82 y.o. female with history of  HTN, hypothyroidism, previous CVA and DM type II found down in her kitchen presenting with confusion. She did not receive IV t-PA.   Syncopal episode, no stroke  MRI  No acute stroke. small vessel disease   MRA  No flow reducing stenosis. R ICA 34mm supraclinoid aneurysm Carotid Doppler   1- 39 percent stenosis involving the right internal carotid artery by end diastolic velocity, [id number] stenosis by peak systolic velocity.  Findings consistent with 1-39 percent stenosis involving the left internal carotid artery.   Bilateral vertebral arteries are patent and antegrade. 2D Echo  - Left ventricle: The cavity size was normal. Wall thickness was   normal. Systolic function was normal. The estimated ejection   fraction was in the range of 55% to 60%. Wall motion was normal;    there were no regional wall motion abnormalities  EEG awake and drowsy EEG is abnormal due to diffuse slowing of the waking background  LDL 80  HgbA1c 6.2  Lovenox 40 mg  sq daily for VTE prophylaxis Diet Carb Modified Fluid consistency: Thin; Room service appropriate? Yes  No antithrombotic prior to admission, now on aspirin 325 mg daily  Therapy recommendations:  pending   Disposition:  pending   Essential Hypertension  Elevated, as high as 172/116 past 24h  Hyperlipidemia  Home meds:  Zocor 20  zocor increased to 40 in hospital  LDL 80  Continue statin at discharge  Diabetes Type II  HgbA1c pending   Other Stroke Risk Factors  Advanced age  ETOH use, advised to drink no more than 1 drink(s) a day  Hx stroke/TIA  Other Active Problems  Rhabdomylosis  Hypothyroidism   GERD  Hx L breast cancer 10 yrs ago  Hospital day # [place] for Pager information 10/08/2016 3:42 PM  I have personally examined this patient, reviewed notes, independently viewed imaging studies, participated in medical decision making and plan of care.ROS completed by me personally and pertinent positives fully documented  I have made any additions or clarifications directly to the above note.  She presented with witnessed fall at likely from a syncopal episode given lack of objective focal findings history or exam. MRI scan is negative for acute infarct.   EEG is negative for seizure activity and cardiac workup for syncope  Is also negative so far.  Stroke team will sign off. Call for questions.   Antony Contras, MD Medical Director [facility] Pager:  [phone] 10/08/2016 3:42 PM  To contact Stroke Continuity provider, please refer to [email or web address] After hours, contact General Neurology

## 2016-10-08 NOTE — Evaluation (Addendum)
Physical Therapy Evaluation Patient Details Name: [name] MRN: [id number] DOB: [birth year] Today's Date: 10/08/2016   History of Present Illness  82 y.o. 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  Pt tends to lean to L side and posteriorly and drifts to L during mobility.  Pt with cognitive deficits impairing safety and ability to care for herself at this time.  Feel pt would benefit from CIR level of therapies to maximize independence and overall decrease burden of care.  Will continue to follow.  Informed RN about pt not being on chair alarm due to having the new alarm box and not the appropriate pad for the seat.  Pt's sister indicates she is staying with pt.      Follow Up Recommendations CIR    Equipment Recommendations  None recommended by PT    Recommendations for Other Services       Precautions / Restrictions Precautions Precautions: Fall Restrictions Weight Bearing Restrictions: No      Mobility  Bed Mobility Overal bed mobility: Needs Assistance Bed Mobility: Supine to Sit     Supine to sit: Min assist     General bed mobility comments: simple directions and gestures A with cueing pt to bring herself to sitting at EOB.  pt only needed MinA for balance.    Transfers Overall transfer level: Needs assistance Equipment used: Rolling walker (2 wheeled) Transfers: Sit to/from [facility] Sit to Stand: Mod assist Stand pivot transfers: Min assist       General transfer comment: cues for UE use and step-by-step through pivot  to 3-in-1.    Ambulation/Gait Ambulation/Gait assistance: Min assist Ambulation Distance (Feet): 50 Feet Assistive device: Rolling walker (2 wheeled) Gait Pattern/deviations: Step-through pattern;Decreased stride length;Shuffle;Drifts right/left;Trunk flexed     General Gait Details: pt tends to drift to L side and stands to L side of RW.  pt needs A for management of RW.  pt unaware of deficits during gait despite running into objects on L side.  Multiple cues for positioning within RW and more upright posture.    Stairs            Wheelchair Mobility    Modified Rankin (Stroke Patients Only) Modified Rankin (Stroke Patients Only) Pre-Morbid Rankin Score: No significant disability Modified Rankin: Moderately severe disability     Balance Overall balance assessment: Needs assistance Sitting-balance support: Single extremity supported;Bilateral upper extremity supported;Feet supported Sitting balance-Leahy Scale: Poor     Standing balance support: Bilateral upper extremity supported;Single extremity supported;During functional activity Standing balance-Leahy Scale: Poor                               Pertinent Vitals/Pain Pain Assessment: Faces Faces Pain Scale: Hurts little more Pain Location: neck Pain Descriptors / Indicators: Tightness Pain Intervention(s): Monitored during session;Premedicated before session;Repositioned    Home Living Family/patient expects to be discharged to:: Private residence Living Arrangements: Alone Available Help at Discharge: Family;Friend(s);Available PRN/intermittently Type of Home: House Home Access: Level entry     Home Layout: One level Home Equipment: Shower  seat      Prior Function Level of Independence: Independent         Comments: Per sister still drives     Hand Dominance   Dominant Hand: Right    Extremity/Trunk Assessment   Upper Extremity Assessment: Defer to OT evaluation            Lower Extremity Assessment: Generalized weakness;Difficult to assess due to impaired cognition      Cervical / Trunk Assessment: Kyphotic  Communication   Communication: Expressive difficulties (though question cognitive more than expressive deficits.)  Cognition Arousal/Alertness: Awake/alert Behavior During Therapy: Restless Overall Cognitive Status: Impaired/Different from baseline Area of Impairment: Orientation;Attention;Memory;Following commands;Safety/judgement;Awareness;Problem solving Orientation Level: Disoriented to;Time;Situation Current Attention Level: Sustained Memory: Decreased recall of precautions;Decreased short-term memory Following Commands: Follows one step commands inconsistently;Follows one step commands with increased time Safety/Judgement: Decreased awareness of safety;Decreased awareness of deficits Awareness: Intellectual Problem Solving: Slow processing;Decreased initiation;Difficulty sequencing;Requires verbal cues;Requires tactile cues General Comments: pt difficult to stay on task and often fidgeting with hands grabbing lines and gown.      General Comments      Exercises     Assessment/Plan    PT Assessment Patient needs continued PT services  PT Problem List Decreased strength;Decreased activity tolerance;Decreased balance;Decreased mobility;Decreased coordination;Decreased cognition;Decreased knowledge of use of DME;Decreased safety awareness          PT Treatment Interventions DME instruction;Gait training;Stair training;Functional mobility training;Therapeutic activities;Therapeutic exercise;Balance training;Neuromuscular re-education;Cognitive remediation;Patient/family education    PT Goals (Current goals can be found in the Care Plan section)  Acute Rehab PT Goals Patient Stated Goal: pt/sister - to be independent PT Goal Formulation: With patient/family Time For Goal Achievement: 10/22/16 Potential to Achieve Goals: Good     Frequency Min 3X/week   Barriers to discharge        Co-evaluation               End of Session Equipment Utilized During Treatment: Gait belt Activity Tolerance: Patient limited by fatigue Patient left: in chair;with call bell/phone within reach;with family/visitor present Nurse Communication: Mobility status         Time: [id number] PT Time Calculation (min) (ACUTE ONLY): 29 min   Charges:   PT Evaluation $PT Eval Moderate Complexity: 1 Procedure PT Treatments $Gait Training: 8-22 mins   PT G CodesCatarina Hartshorn, [place] 10/08/2016, 1:17 PM

## 2016-10-08 NOTE — Consult Note (Signed)
Physical Medicine and Rehabilitation Consult Reason for Consult: Syncope with history of CVA Referring Physician: [facility]   HPI: [name] is a 82 y.o. right handed female with history of diabetes mellitus, hypertension, breast cancer, CVA with residual tremor right side. Per chart review patient lives alone and was independent with ADLs and mobility prior to admission with progressive decline over the past 6 weeks. Her husband has dementia and is currently in a skilled nursing facility. Presented 10/06/2016 after being found face down on the kitchen floor of her home by her sister with altered mental status as well as bouts of nausea and vomiting. Reports of progressive weakness over the past 6 weeks. Recent imaging of abdomen by PCP unremarkable. Patient was scheduled to have an upper GI series performed prior to this latest event. Patient has had a 40 pound weight loss since the beginning of January. Blood pressure elevated 172/116, WBC 16,400, creatinine 1.08, CPK 2341. Troponin negative. CT and imaging revealed hypoattenuation of the left frontoparietal  lobe. Patient did not receive TPA. MRI/MRA of the brain showed no acute finding. Chronic microvascular disease and atrophy. There was a 2 mm right supraclinoid ICA aneurysm. EEG negative for seizure. Carotid Dopplers completed showing right internal carotid artery 40-59% stenosis. Echocardiogram with 60% ejection fraction grade 1 diastolic dysfunction without emboli. UGI with KUB no esophageal stricture or mass. Mild esophageal reflux. Normal duodenum.Marland Kitchen Neurology consulted workup presently ongoing currently maintained on aspirin for CVA prophylaxis. Subcutaneous Lovenox for DVT prophylaxis. Maintain on a regular diet. Occupational therapy evaluation completed with recommendations of physical medicine rehabilitation consult.   Review of Systems  Constitutional: Positive for weight loss.  HENT: Negative for hearing loss and tinnitus.     Eyes: Negative for blurred vision and double vision.  Respiratory: Negative for cough and shortness of breath.   Cardiovascular: Negative for chest pain, palpitations and leg swelling.  Gastrointestinal: Positive for nausea and vomiting.  Genitourinary: Negative for dysuria and hematuria.  Musculoskeletal: Positive for back pain and falls.  Skin: Negative for rash.  Neurological: Positive for tremors and weakness.  Psychiatric/Behavioral:       Anxiety  All other systems reviewed and are negative.  Past Medical History:  Diagnosis Date  . Arthritis   . Cancer [facility])    breast cancer on left - 10  years ago  . Diabetes mellitus without complication ([place])   . GERD (gastroesophageal reflux disease)   . Hypertension   . Hypothyroidism   . Stroke ([place])    2014  . Wears glasses    Past Surgical History:  Procedure Laterality Date  . ABDOMINAL HYSTERECTOMY    . APPENDECTOMY    . ARCUATE KERATECTOMY    . BUNIONECTOMY     right  . CARPAL TUNNEL RELEASE     right  . CARPAL TUNNEL RELEASE Left 05/24/2014   Procedure: LEFT CARPAL TUNNEL RELEASE LEFT THUMB TRIGGER FINGER RELEASE;  Surgeon: Cammie Sickle, MD;  Location: [place];  Service: Orthopedics;  Laterality: Left;  left thumb also site of incision  . DEBRIDEMENT OF ABDOMINAL WALL ABSCESS  1978  . OVARIAN CYST SURGERY     x2-  . TONSILLECTOMY    . TRIGGER FINGER RELEASE Left 01/25/2016   Procedure: RELEASE A-1 PULLEY LEFT RING FINGER, LEFT SMALL FINGER;  Surgeon: Daryll Brod, MD;  Location: [place];  Service: Orthopedics;  Laterality: Left;  ANESTHESIA: IV REGIONAL FAB   History reviewed.  No pertinent family history. Social History:  reports that she has quit smoking. Her smoking use included Cigarettes. She has never used smokeless tobacco. She reports that she drinks alcohol. She reports that she does not use drugs. Allergies:  Allergies  Allergen Reactions  . Tramadol Anaphylaxis  .  Hydrocodone-Acetaminophen Other (See Comments)  . Morphine And Related Nausea And Vomiting  . Penicillins Swelling  . Sitagliptin Other (See Comments)  . Tetracyclines & Related Swelling    Face and eyes swells   Medications Prior to Admission  Medication Sig Dispense Refill  . ALPRAZolam (XANAX) 0.25 MG tablet Take 0.25 mg by mouth 3 (three) times daily.    . felodipine (PLENDIL) 5 MG 24 hr tablet Take 5 mg by mouth daily.    Marland Kitchen levothyroxine (SYNTHROID, LEVOTHROID) 50 MCG tablet Take 50 mcg by mouth daily before breakfast.    . losartan (COZAAR) 100 MG tablet Take 100 mg by mouth daily.    . metFORMIN (GLUCOPHAGE-XR) 750 MG 24 hr tablet Take 750 mg by mouth daily with breakfast.    . ondansetron (ZOFRAN) 8 MG tablet Take 8 mg by mouth every 8 (eight) hours as needed for nausea.    . promethazine (PHENERGAN) 25 MG tablet Take 25 mg by mouth every 6 (six) hours as needed for nausea or vomiting.    . Ascorbic Acid (VITAMIN C ER PO) Take by mouth daily.    . Coenzyme Q10 (CO Q-10) 100 MG CAPS Take by mouth.    . Fish Oil OIL by Does not apply route daily.    . Multiple Vitamins-Minerals (MULTIVITAMIN WITH MINERALS) tablet Take 1 tablet by mouth daily.    Marland Kitchen omeprazole (PRILOSEC) 20 MG capsule Take 20 mg by mouth daily.    . Red Yeast Rice Extract (RED YEAST RICE PO) Take by mouth daily.    Marland Kitchen senna-docusate (SENOKOT-S) 8.6-50 MG per tablet Take 1 tablet by mouth 2 (two) times daily. (Patient not taking: Reported on 10/06/2016) 30 tablet 0  . simvastatin (ZOCOR) 20 MG tablet Take 20 mg by mouth daily.    . traMADol (ULTRAM) 50 MG tablet Take 1 tablet (50 mg total) by mouth every 6 (six) hours as needed. 20 tablet 0  . UNABLE TO FIND Magnilife pain relieving foot cream,as needed    . vitamin B-12 (CYANOCOBALAMIN) 250 MCG tablet Take 250 mcg by mouth daily.      Home: Home Living Family/patient expects to be discharged to:: Private residence Living Arrangements: Alone Available Help at  Discharge: Family, Friend(s), Available PRN/intermittently Type of Home: House Home Access: Level entry [place]: One level Bathroom Shower/Tub: Multimedia programmer: Standard Bathroom Accessibility: Yes Home Equipment: Careers adviser History: Prior Function Level of Independence: Independent Comments: Per sister still drives Functional Status:  Mobility: Bed Mobility Overal bed mobility: Needs Assistance Bed Mobility: Supine to Sit Supine to sit: Mod assist General bed mobility comments: difficulty with problem solving on how to get to EOB Transfers Overall transfer level: Needs assistance Transfers: Sit to/from Stand, Stand Pivot Transfers Sit to Stand: Mod assist Stand pivot transfers: Mod assist General transfer comment: Poor awareness of midline. posterior bias      ADL: ADL Overall ADL's : Needs assistance/impaired Eating/Feeding: Minimal assistance, Sitting Grooming: Minimal assistance, Brushing hair, Wash/dry hands, Wash/dry face Upper Body Bathing: Minimal assitance, Sitting Lower Body Bathing: Moderate assistance, Sit to/from stand Upper Body Dressing : Moderate assistance, Sitting Lower Body Dressing: Moderate assistance, Sit to/from stand Toilet  Transfer: Moderate assistance, Stand-pivot Toileting- Clothing Manipulation and Hygiene: Moderate assistance Functional mobility during ADLs: Moderate assistance  Cognition: Cognition Overall Cognitive Status: Impaired/Different from baseline Orientation Level: Oriented to person, Oriented to place, Disoriented to time, Oriented to situation Cognition Arousal/Alertness: Awake/alert Behavior During Therapy: Restless, Anxious Overall Cognitive Status: Impaired/Different from baseline Area of Impairment: Orientation, Attention, Memory, Following commands, Safety/judgement, Problem solving, Awareness Orientation Level: Disoriented to, Place, Time Current Attention Level: Sustained Memory:  Decreased recall of precautions, Decreased short-term memory Following Commands: Follows one step commands inconsistently Safety/Judgement: Decreased awareness of safety, Decreased awareness of deficits Awareness: Intellectual Problem Solving: Slow processing, Difficulty sequencing, Requires verbal cues  Blood pressure (!) 156/75, pulse 76, temperature 98.2 F (36.8 C), temperature source Oral, resp. rate 18, height 5\' 4"  (1.626 m), weight 65.8 kg (145 lb), SpO2 96 %. Physical Exam  Vitals reviewed. HENT:  Multiple bruises to the face and orbital area  Eyes: EOM are normal.  Neck: Normal range of motion. Neck supple. No thyromegaly present.  Cardiovascular: Normal rate and regular rhythm.   Respiratory: Effort normal and breath sounds normal. No respiratory distress.  GI: Soft. Bowel sounds are normal. She exhibits no distension.  Neurological: She is alert.  Mood is flat but appropriate. She provides her name and age. She cannot recall the name of the skilled nursing facility that her husband is currently a resident of. Followed simple commands  Skin: Skin is warm and dry.    Results for orders placed or performed during the hospital encounter of 10/06/16 (from the past 24 hour(s))  Glucose, capillary     Status: None   Collection Time: 10/07/16  6:44 AM  Result Value Ref Range   Glucose-Capillary 94 65 - 99 mg/dL   Comment 1 Notify RN    Comment 2 Document in Chart   CBC     Status: Abnormal   Collection Time: 10/07/16  7:06 AM  Result Value Ref Range   WBC 9.5 4.0 - 10.5 K/uL   RBC 3.74 (L) 3.87 - 5.11 MIL/uL   Hemoglobin 12.2 12.0 - 15.0 g/dL   HCT 36.2 36.0 - 46.0 %   MCV 96.8 78.0 - 100.0 fL   MCH 32.6 26.0 - 34.0 pg   MCHC 33.7 30.0 - 36.0 g/dL   RDW 12.6 11.5 - 15.5 %   Platelets 223 150 - 400 K/uL  Basic metabolic panel     Status: Abnormal   Collection Time: 10/07/16  7:06 AM  Result Value Ref Range   Sodium 140 135 - 145 mmol/L   Potassium 3.8 3.5 - 5.1 mmol/L     Chloride 106 101 - 111 mmol/L   CO2 25 22 - 32 mmol/L   Glucose, Bld 95 65 - 99 mg/dL   BUN 17 6 - 20 mg/dL   Creatinine, Ser 1.06 (H) 0.44 - 1.00 mg/dL   Calcium 9.0 8.9 - 10.3 mg/dL   GFR calc non Af Amer 48 (L) >60 mL/min   GFR calc Af Amer 56 (L) >60 mL/min   Anion gap 9 5 - 15  CK     Status: Abnormal   Collection Time: 10/07/16  7:06 AM  Result Value Ref Range   Total CK 1,584 (H) 38 - 234 U/L  TSH     Status: None   Collection Time: 10/07/16  7:06 AM  Result Value Ref Range   TSH 1.510 0.350 - 4.500 uIU/mL  Lipid panel     Status: None  Collection Time: 10/07/16  7:06 AM  Result Value Ref Range   Cholesterol 154 0 - 200 mg/dL   Triglycerides 108 <150 mg/dL   HDL 52 >40 mg/dL   Total CHOL/HDL Ratio 3.0 RATIO   VLDL 22 0 - 40 mg/dL   LDL Cholesterol 80 0 - 99 mg/dL  Glucose, capillary     Status: None   Collection Time: 10/07/16 12:20 PM  Result Value Ref Range   Glucose-Capillary 90 65 - 99 mg/dL   Comment 1 Notify RN    Comment 2 Document in Chart   Glucose, capillary     Status: Abnormal   Collection Time: 10/07/16  5:52 PM  Result Value Ref Range   Glucose-Capillary 133 (H) 65 - 99 mg/dL  Glucose, capillary     Status: Abnormal   Collection Time: 10/07/16  9:44 PM  Result Value Ref Range   Glucose-Capillary 112 (H) 65 - 99 mg/dL   Comment 1 Notify RN    Comment 2 Document in Chart    Ct Head Wo Contrast  Result Date: 10/06/2016 CLINICAL DATA:  Altered mental status. Patient is status post fall with trauma to the left side of her head. EXAM: CT HEAD WITHOUT CONTRAST CT CERVICAL SPINE WITHOUT CONTRAST TECHNIQUE: Multidetector CT imaging of the head and cervical spine was performed following the standard protocol without intravenous contrast. Multiplanar CT image reconstructions of the cervical spine were also generated. COMPARISON:  None. FINDINGS: CT HEAD FINDINGS Brain: No evidence of acute hemorrhage, hydrocephalus, extra-axial collection or mass  lesion/mass effect. Subtle area of hypoattenuation in the subcortical left frontoparietal lobe may represent an acute ischemic infarction. Moderate brain parenchymal volume loss and microangiopathy noted. Vascular: Atherosclerotic calcifications at the skullbase. Skull: Normal. Negative for fracture or focal lesion. Sinuses/Orbits: No acute finding. Other: None. CT CERVICAL SPINE FINDINGS The study is degraded by motion artifact. Alignment: Reversal of physiologic lordosis likely secondary to extensive multifocal osteoarthritic changes of the cervical spine. Skull base and vertebrae: No evidence of fracture. Soft tissues and spinal canal: Ligamentous thickening posterior to the dens, likely degenerative. Disc levels: Moderate to severe osteoarthritic changes with disc space narrowing, remodeling of vertebral bodies, osteophyte formation. Similarly posterior facet arthropathy at all levels. Upper chest: Negative. Other: None. IMPRESSION: Area of hypoattenuation in the subcortical left frontoparietal lobe may represent acute ischemic infarction. Moderate brain parenchymal volume loss and microangiopathy. No evidence of acute traumatic injury to the cervical spine. Extensive spondylosis of the cervical spine with reversal of physiologic lordosis. Please note that the cervical spine CT is degraded by motion artifact due to patient's inability to cooperate. These results were called by telephone at the time of interpretation on 10/06/2016 at 6:25 pm to Dr. Shirlyn Goltz , who verbally acknowledged these results. Electronically Signed   By: Fidela Salisbury M.D.   On: 10/06/2016 18:28   Ct Cervical Spine Wo Contrast  Result Date: 10/06/2016 CLINICAL DATA:  Altered mental status. Patient is status post fall with trauma to the left side of her head. EXAM: CT HEAD WITHOUT CONTRAST CT CERVICAL SPINE WITHOUT CONTRAST TECHNIQUE: Multidetector CT imaging of the head and cervical spine was performed following the standard  protocol without intravenous contrast. Multiplanar CT image reconstructions of the cervical spine were also generated. COMPARISON:  None. FINDINGS: CT HEAD FINDINGS Brain: No evidence of acute hemorrhage, hydrocephalus, extra-axial collection or mass lesion/mass effect. Subtle area of hypoattenuation in the subcortical left frontoparietal lobe may represent an acute ischemic infarction. Moderate brain parenchymal  volume loss and microangiopathy noted. Vascular: Atherosclerotic calcifications at the skullbase. Skull: Normal. Negative for fracture or focal lesion. Sinuses/Orbits: No acute finding. Other: None. CT CERVICAL SPINE FINDINGS The study is degraded by motion artifact. Alignment: Reversal of physiologic lordosis likely secondary to extensive multifocal osteoarthritic changes of the cervical spine. Skull base and vertebrae: No evidence of fracture. Soft tissues and spinal canal: Ligamentous thickening posterior to the dens, likely degenerative. Disc levels: Moderate to severe osteoarthritic changes with disc space narrowing, remodeling of vertebral bodies, osteophyte formation. Similarly posterior facet arthropathy at all levels. Upper chest: Negative. Other: None. IMPRESSION: Area of hypoattenuation in the subcortical left frontoparietal lobe may represent acute ischemic infarction. Moderate brain parenchymal volume loss and microangiopathy. No evidence of acute traumatic injury to the cervical spine. Extensive spondylosis of the cervical spine with reversal of physiologic lordosis. Please note that the cervical spine CT is degraded by motion artifact due to patient's inability to cooperate. These results were called by telephone at the time of interpretation on 10/06/2016 at 6:25 pm to Dr. Shirlyn Goltz , who verbally acknowledged these results. Electronically Signed   By: Fidela Salisbury M.D.   On: 10/06/2016 18:28   Mr Brain Wo Contrast  Result Date: 10/07/2016 CLINICAL DATA:  Found on floor.   Possible stroke. EXAM: MRI HEAD WITHOUT CONTRAST MRA HEAD WITHOUT CONTRAST TECHNIQUE: Multiplanar, multiecho pulse sequences of the brain and surrounding structures were obtained without intravenous contrast. Angiographic images of the head were obtained using MRA technique without contrast. COMPARISON:  Head CT from yesterday. FINDINGS: MRI HEAD FINDINGS Brain: No acute infarction, hemorrhage, hydrocephalus, extra-axial collection or mass lesion. Ventriculomegaly that is likely central predominant atrophy. Mesial temporal volume loss is mild-to-moderate. No third ventricular ballooning. Small-vessel ischemic changes in cerebral white matter, with a thin confluent band around the lateral ventricles. Vascular: Normal flow voids. Skull and upper cervical spine: Hypo intense appearance of the dens on T1 weighted imaging is from sclerosis. No focal marrow lesion. Sinuses/Orbits: Cataract resection. Other: Motion degraded study, best obtainable in a setting. MRA HEAD FINDINGS Tortuous right cervical ICA. Symmetric carotid arteries and branching. No major branch occlusion or flow limiting stenosis. No unusual vertebrobasilar branching. No flow limiting stenosis or major branch occlusion. 2 mm outpouching directed inferiorly from the right supraclinoid ICA, with no visible neighboring vessel. IMPRESSION: 1. No acute finding, including infarct. 2. Chronic microvascular disease and atrophy. 3. No acute finding on intracranial MRA.  No flow limiting stenosis. 4. 2 mm right supraclinoid ICA aneurysm or infundibulum. Electronically Signed   By: Monte Fantasia M.D.   On: 10/07/2016 06:44   Dg Chest Port 1 View  Result Date: 10/06/2016 CLINICAL DATA:  Altered mental status EXAM: PORTABLE CHEST 1 VIEW COMPARISON:  03/21/2014 FINDINGS: The heart size and mediastinal contours are within normal limits. Both lungs are clear. The visualized skeletal structures are unremarkable. IMPRESSION: No active disease. Electronically Signed    By: Kathreen Devoid   On: 10/06/2016 16:15   Mr Jodene Nam Head/brain Wo Cm  Result Date: 10/07/2016 CLINICAL DATA:  Found on floor.  Possible stroke. EXAM: MRI HEAD WITHOUT CONTRAST MRA HEAD WITHOUT CONTRAST TECHNIQUE: Multiplanar, multiecho pulse sequences of the brain and surrounding structures were obtained without intravenous contrast. Angiographic images of the head were obtained using MRA technique without contrast. COMPARISON:  Head CT from yesterday. FINDINGS: MRI HEAD FINDINGS Brain: No acute infarction, hemorrhage, hydrocephalus, extra-axial collection or mass lesion. Ventriculomegaly that is likely central predominant atrophy. Mesial temporal volume loss  is mild-to-moderate. No third ventricular ballooning. Small-vessel ischemic changes in cerebral white matter, with a thin confluent band around the lateral ventricles. Vascular: Normal flow voids. Skull and upper cervical spine: Hypo intense appearance of the dens on T1 weighted imaging is from sclerosis. No focal marrow lesion. Sinuses/Orbits: Cataract resection. Other: Motion degraded study, best obtainable in a setting. MRA HEAD FINDINGS Tortuous right cervical ICA. Symmetric carotid arteries and branching. No major branch occlusion or flow limiting stenosis. No unusual vertebrobasilar branching. No flow limiting stenosis or major branch occlusion. 2 mm outpouching directed inferiorly from the right supraclinoid ICA, with no visible neighboring vessel. IMPRESSION: 1. No acute finding, including infarct. 2. Chronic microvascular disease and atrophy. 3. No acute finding on intracranial MRA.  No flow limiting stenosis. 4. 2 mm right supraclinoid ICA aneurysm or infundibulum. Electronically Signed   By: Monte Fantasia M.D.   On: 10/07/2016 06:44    Assessment/Plan: Diagnosis: Debility, rhabdo related to recent fall.  1. Does the need for close, 24 hr/day medical supervision in concert with the patient's rehab needs make it unreasonable for this patient  to be served in a less intensive setting? Yes 2. Co-Morbidities requiring supervision/potential complications: htn, gerd, hx of cva 3. Due to bladder management, bowel management, safety, skin/wound care, disease management and pain management, does the patient require 24 hr/day rehab nursing? Yes 4. Does the patient require coordinated care of a physician, rehab nurse, PT (1-2 hrs/day, 5 days/week) and OT (2-3 hrs/day, 5 days/week) to address physical and functional deficits in the context of the above medical diagnosis(es)? Yes Addressing deficits in the following areas: balance, endurance, locomotion, strength, transferring, bowel/bladder control, bathing, dressing, feeding, grooming and toileting 5. Can the patient actively participate in an intensive therapy program of at least 3 hrs of therapy per day at least 5 days per week? Yes 6. The potential for patient to make measurable gains while on inpatient rehab is excellent 7. Anticipated functional outcomes upon discharge from inpatient rehab are modified independent  with PT, modified independent and supervision with OT, modified independent with SLP. 8. Estimated rehab length of stay to reach the above functional goals is: 8-12 days 9. Does the patient have adequate social supports and living environment to accommodate these discharge functional goals? Yes 10. Anticipated D/C setting: Home 11. Anticipated post D/C treatments: HH therapy and Outpatient therapy 12. Overall Rehab/Functional Prognosis: excellent  RECOMMENDATIONS: This patient's condition is appropriate for continued rehabilitative care in the following setting: CIR Patient has agreed to participate in recommended program. Yes and Potentially Note that insurance prior authorization may be required for reimbursement for recommended care.  Comment: Rehab Admissions Coordinator to follow up.  Thanks,  [name], MD, Mellody Drown     10/08/2016

## 2016-10-08 NOTE — Progress Notes (Signed)
PROGRESS NOTE    [name]  [id number] DOB: 03/24/1934 DOA: 10/06/2016 PCP: Mathews Argyle, MD   Outpatient Specialists:    Brief Narrative:  [name] is a 82 y.o. 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 Much of the history is obtained by the patient's sister as the patient is currently unable to provide her own history at this time. At baseline the patient was noted to be very independent and lived alone. Patient was last seen normal sometime around 12 noon yesterday. She had been taken to the urgent care for nausea vomiting symptoms and prescribed promethazine.  Patient had been suffering from nausea and vomiting since January of this year intermittently. However, over the last 6 weeks or so symptoms have been progressively worse. She had been evaluated by her primary care provider with MRI of the abdomen showed no overt cause symptoms. Patient was scheduled to have a upper GI series performed tomorrow. The patient's sister notes that she had been trying to call her last night, but the patient did not answer. When she found her this morning she was still in her pajamas, her leg was jerking, and by the appearance of the kitchen it seemed as if she had been trying to feed the cat. The patient had complained of some diarrhea/constipation and approximately 40 pound weight loss since the beginning of the year.     Assessment & Plan:   Principal Problem:   Stroke [facility]) Active Problems:   Aphasia   Leukocytosis   Essential hypertension   Hypothyroidism   GERD (gastroesophageal reflux disease)   Hyperlipemia   Syncope   Possible Stroke/ expressive aphasia: Acute/subacute.  - MRI negative -  hemoglobin A1c 6.3 and lipid panel LDL 80 - ASA - EEG ok - Appreciate neurology consultation will  follow-up for further recommendations  Abdominal pain/nausea -UGI with KUB done -being worked up by Dr. Paulita Fujita  Rhabdomyolysis:  -resolved   Leukocytosis:  -resolved -no sign of infection   Essential HTN - continue losartan and felodipine   Hypothyroidism - TSH ok - continue Levothyroxine  Diabetes mellitus type II - hypoglycemia protocol - Held metformin - CBGs every before meals with sensitive sliding scale insulin  Hyperlipidemia - Continue simvastatin   Jerrye Bushy - continue protonix    DVT prophylaxis:  Lovenox   Code Status: Full Code   Family Communication:   Disposition Plan:  CIR?   Consultants:   neuro     Subjective: Much more awake  Objective: Vitals:   10/08/16 0129 10/08/16 0550 10/08/16 1023 10/08/16 1352  BP: (!) 140/58 (!) 156/75 112/63 122/60  Pulse: 70 76 71 76  Resp: 18 18 18 18   Temp: 98 F (36.7 C) 98.2 F (36.8 C) 98.9 F (37.2 C) 97.8 F (36.6 C)  TempSrc: Oral Oral Oral Oral  SpO2: 96% 96% 96% 94%  Weight:      Height:        Intake/Output Summary (Last 24 hours) at 10/08/16 1520 Last data filed at 10/08/16 0300  Gross per 24 hour  Intake          2181.25 ml  Output                0 ml  Net          2181.25 ml  Filed Weights   10/06/16 1526  Weight: 65.8 kg (145 lb)    Examination:  General exam: awake, NAD Respiratory system: Clear to auscultation. Respiratory effort normal. Cardiovascular system: S1 & S2 heard, RRR. No JVD, murmurs, rubs, gallops or clicks. No pedal edema. Gastrointestinal system: Abdomen is nondistended, soft and nontender. No organomegaly or masses felt. Normal bowel sounds heard.      Data Reviewed: I have personally reviewed following labs and imaging studies  CBC:  Recent Labs Lab 10/06/16 1547 10/06/16 1559 10/07/16 0706  WBC 16.4*  --  9.5  NEUTROABS 13.5*  --   --   HGB 13.5 13.3 12.2  HCT 38.8 39.0 36.2  MCV 93.9  --  96.8  PLT 267  --  [id number]   Basic  Metabolic Panel:  Recent Labs Lab 10/06/16 1547 10/06/16 1559 10/07/16 0706  NA 137 138 140  K 3.8 3.8 3.8  CL 98* 100* 106  CO2 26  --  25  GLUCOSE 120* 112* 95  BUN 19 20 17   CREATININE 1.08* 1.10* 1.06*  CALCIUM 9.8  --  9.0   GFR: Estimated Creatinine Clearance: 39.5 mL/min (by C-G formula based on SCr of 1.06 mg/dL (H)). Liver Function Tests:  Recent Labs Lab 10/06/16 1547  AST 50*  ALT 21  ALKPHOS 71  BILITOT 1.0  PROT 7.3  ALBUMIN 4.3   No results for input(s): LIPASE, AMYLASE in the last 168 hours. No results for input(s): AMMONIA in the last 168 hours. Coagulation Profile:  Recent Labs Lab 10/06/16 1547  INR 0.96   Cardiac Enzymes:  Recent Labs Lab 10/06/16 1547 10/07/16 0706 10/08/16 0812  CKTOTAL 2,341* 1,584* 681*   BNP (last 3 results) No results for input(s): PROBNP in the last 8760 hours. HbA1C:  Recent Labs  10/07/16 0706  HGBA1C 6.2*   CBG:  Recent Labs Lab 10/07/16 1220 10/07/16 1752 10/07/16 2144 10/08/16 0618 10/08/16 1125  GLUCAP 90 133* 112* 96 160*   Lipid Profile:  Recent Labs  10/07/16 0706  CHOL 154  HDL 52  LDLCALC 80  TRIG 108  CHOLHDL 3.0   Thyroid Function Tests:  Recent Labs  10/07/16 0706  TSH 1.510   Anemia Panel: No results for input(s): VITAMINB12, FOLATE, FERRITIN, TIBC, IRON, RETICCTPCT in the last 72 hours. Urine analysis:    Component Value Date/Time   COLORURINE YELLOW 10/06/2016 [place] 10/06/2016 1535   LABSPEC 1.015 10/06/2016 1535   PHURINE 6.0 10/06/2016 1535   GLUCOSEU >1000 (A) 10/06/2016 1535   HGBUR TRACE (A) 10/06/2016 1535   BILIRUBINUR NEGATIVE 10/06/2016 1535   KETONESUR 15 (A) 10/06/2016 1535   PROTEINUR 100 (A) 10/06/2016 1535   NITRITE NEGATIVE 10/06/2016 1535   LEUKOCYTESUR NEGATIVE 10/06/2016 1535     )No results found for this or any previous visit (from the past 240 hour(s)).    Anti-infectives    None       Radiology  Studies: Ct Head Wo Contrast  Result Date: 10/06/2016 CLINICAL DATA:  Altered mental status. Patient is status post fall with trauma to the left side of her head. EXAM: CT HEAD WITHOUT CONTRAST CT CERVICAL SPINE WITHOUT CONTRAST TECHNIQUE: Multidetector CT imaging of the head and cervical spine was performed following the standard protocol without intravenous contrast. Multiplanar CT image reconstructions of the cervical spine were also generated. COMPARISON:  None. FINDINGS: CT HEAD FINDINGS Brain: No evidence of acute hemorrhage, hydrocephalus, extra-axial collection or mass lesion/mass effect. Subtle area  of hypoattenuation in the subcortical left frontoparietal lobe may represent an acute ischemic infarction. Moderate brain parenchymal volume loss and microangiopathy noted. Vascular: Atherosclerotic calcifications at the skullbase. Skull: Normal. Negative for fracture or focal lesion. Sinuses/Orbits: No acute finding. Other: None. CT CERVICAL SPINE FINDINGS The study is degraded by motion artifact. Alignment: Reversal of physiologic lordosis likely secondary to extensive multifocal osteoarthritic changes of the cervical spine. Skull base and vertebrae: No evidence of fracture. Soft tissues and spinal canal: Ligamentous thickening posterior to the dens, likely degenerative. Disc levels: Moderate to severe osteoarthritic changes with disc space narrowing, remodeling of vertebral bodies, osteophyte formation. Similarly posterior facet arthropathy at all levels. Upper chest: Negative. Other: None. IMPRESSION: Area of hypoattenuation in the subcortical left frontoparietal lobe may represent acute ischemic infarction. Moderate brain parenchymal volume loss and microangiopathy. No evidence of acute traumatic injury to the cervical spine. Extensive spondylosis of the cervical spine with reversal of physiologic lordosis. Please note that the cervical spine CT is degraded by motion artifact due to patient's inability  to cooperate. These results were called by telephone at the time of interpretation on 10/06/2016 at 6:25 pm to Dr. Shirlyn Goltz , who verbally acknowledged these results. Electronically Signed   By: Fidela Salisbury M.D.   On: 10/06/2016 18:28   Ct Cervical Spine Wo Contrast  Result Date: 10/06/2016 CLINICAL DATA:  Altered mental status. Patient is status post fall with trauma to the left side of her head. EXAM: CT HEAD WITHOUT CONTRAST CT CERVICAL SPINE WITHOUT CONTRAST TECHNIQUE: Multidetector CT imaging of the head and cervical spine was performed following the standard protocol without intravenous contrast. Multiplanar CT image reconstructions of the cervical spine were also generated. COMPARISON:  None. FINDINGS: CT HEAD FINDINGS Brain: No evidence of acute hemorrhage, hydrocephalus, extra-axial collection or mass lesion/mass effect. Subtle area of hypoattenuation in the subcortical left frontoparietal lobe may represent an acute ischemic infarction. Moderate brain parenchymal volume loss and microangiopathy noted. Vascular: Atherosclerotic calcifications at the skullbase. Skull: Normal. Negative for fracture or focal lesion. Sinuses/Orbits: No acute finding. Other: None. CT CERVICAL SPINE FINDINGS The study is degraded by motion artifact. Alignment: Reversal of physiologic lordosis likely secondary to extensive multifocal osteoarthritic changes of the cervical spine. Skull base and vertebrae: No evidence of fracture. Soft tissues and spinal canal: Ligamentous thickening posterior to the dens, likely degenerative. Disc levels: Moderate to severe osteoarthritic changes with disc space narrowing, remodeling of vertebral bodies, osteophyte formation. Similarly posterior facet arthropathy at all levels. Upper chest: Negative. Other: None. IMPRESSION: Area of hypoattenuation in the subcortical left frontoparietal lobe may represent acute ischemic infarction. Moderate brain parenchymal volume loss and  microangiopathy. No evidence of acute traumatic injury to the cervical spine. Extensive spondylosis of the cervical spine with reversal of physiologic lordosis. Please note that the cervical spine CT is degraded by motion artifact due to patient's inability to cooperate. These results were called by telephone at the time of interpretation on 10/06/2016 at 6:25 pm to Dr. Shirlyn Goltz , who verbally acknowledged these results. Electronically Signed   By: Fidela Salisbury M.D.   On: 10/06/2016 18:28   Mr Brain Wo Contrast  Result Date: 10/07/2016 CLINICAL DATA:  Found on floor.  Possible stroke. EXAM: MRI HEAD WITHOUT CONTRAST MRA HEAD WITHOUT CONTRAST TECHNIQUE: Multiplanar, multiecho pulse sequences of the brain and surrounding structures were obtained without intravenous contrast. Angiographic images of the head were obtained using MRA technique without contrast. COMPARISON:  Head CT from yesterday. FINDINGS: MRI HEAD  FINDINGS Brain: No acute infarction, hemorrhage, hydrocephalus, extra-axial collection or mass lesion. Ventriculomegaly that is likely central predominant atrophy. Mesial temporal volume loss is mild-to-moderate. No third ventricular ballooning. Small-vessel ischemic changes in cerebral white matter, with a thin confluent band around the lateral ventricles. Vascular: Normal flow voids. Skull and upper cervical spine: Hypo intense appearance of the dens on T1 weighted imaging is from sclerosis. No focal marrow lesion. Sinuses/Orbits: Cataract resection. Other: Motion degraded study, best obtainable in a setting. MRA HEAD FINDINGS Tortuous right cervical ICA. Symmetric carotid arteries and branching. No major branch occlusion or flow limiting stenosis. No unusual vertebrobasilar branching. No flow limiting stenosis or major branch occlusion. 2 mm outpouching directed inferiorly from the right supraclinoid ICA, with no visible neighboring vessel. IMPRESSION: 1. No acute finding, including infarct. 2.  Chronic microvascular disease and atrophy. 3. No acute finding on intracranial MRA.  No flow limiting stenosis. 4. 2 mm right supraclinoid ICA aneurysm or infundibulum. Electronically Signed   By: Monte Fantasia M.D.   On: 10/07/2016 06:44   Dg Chest Port 1 View  Result Date: 10/06/2016 CLINICAL DATA:  Altered mental status EXAM: PORTABLE CHEST 1 VIEW COMPARISON:  03/21/2014 FINDINGS: The heart size and mediastinal contours are within normal limits. Both lungs are clear. The visualized skeletal structures are unremarkable. IMPRESSION: No active disease. Electronically Signed   By: Kathreen Devoid   On: 10/06/2016 16:15   Dg Duanne Limerick  W/kub  Result Date: 10/08/2016 CLINICAL DATA:  Difficulty swallowing.  Dysphagia. EXAM: UPPER GI SERIES WITH KUB TECHNIQUE: After obtaining a scout radiograph a routine upper GI series was performed using thin barium FLUOROSCOPY TIME:  Fluoroscopy Time:  1 minutes 18 seconds Radiation Exposure Index (if provided by the fluoroscopic device): 0.9 mGy Number of Acquired Spot Images: 21 COMPARISON:  MRI upper abdomen 09/17/2016 FINDINGS: Exam was limited due to patient immobility. Patient was examined in a 30 degree supine position. Initial KUB demonstrates no bowel obstruction. No mucosal irregularity stricture or mass of the esophagus. The GE junction was widely patent. Mild gastroesophageal reflux. There is no mucosal irregularity, stricture or mass of the stomach. The duodenum bulb is normal. The C-loop and more distal third and fourth portion the duodenum is normal. IMPRESSION: 1. No esophageal stricture mass or obstruction. 2. Mild esophageal reflux. 3. Normal stomach without evidence of mass or obstruction. 4. Normal duodenum. 5. Exam is somewhat limited due to patient immobility and single contrast. Electronically Signed   By: Suzy Bouchard M.D.   On: 10/08/2016 10:28   Mr Jodene Nam Head/brain [id number] Cm  Result Date: 10/07/2016 CLINICAL DATA:  Found on floor.  Possible stroke.  EXAM: MRI HEAD WITHOUT CONTRAST MRA HEAD WITHOUT CONTRAST TECHNIQUE: Multiplanar, multiecho pulse sequences of the brain and surrounding structures were obtained without intravenous contrast. Angiographic images of the head were obtained using MRA technique without contrast. COMPARISON:  Head CT from yesterday. FINDINGS: MRI HEAD FINDINGS Brain: No acute infarction, hemorrhage, hydrocephalus, extra-axial collection or mass lesion. Ventriculomegaly that is likely central predominant atrophy. Mesial temporal volume loss is mild-to-moderate. No third ventricular ballooning. Small-vessel ischemic changes in cerebral white matter, with a thin confluent band around the lateral ventricles. Vascular: Normal flow voids. Skull and upper cervical spine: Hypo intense appearance of the dens on T1 weighted imaging is from sclerosis. No focal marrow lesion. Sinuses/Orbits: Cataract resection. Other: Motion degraded study, best obtainable in a setting. MRA HEAD FINDINGS Tortuous right cervical ICA. Symmetric carotid arteries and branching. No major branch occlusion  or flow limiting stenosis. No unusual vertebrobasilar branching. No flow limiting stenosis or major branch occlusion. 2 mm outpouching directed inferiorly from the right supraclinoid ICA, with no visible neighboring vessel. IMPRESSION: 1. No acute finding, including infarct. 2. Chronic microvascular disease and atrophy. 3. No acute finding on intracranial MRA.  No flow limiting stenosis. 4. 2 mm right supraclinoid ICA aneurysm or infundibulum. Electronically Signed   By: Monte Fantasia M.D.   On: 10/07/2016 06:44        Scheduled Meds: . aspirin  325 mg Oral Daily  . atorvastatin  40 mg Oral q1800  . enoxaparin (LOVENOX) injection  40 mg Subcutaneous Q24H  . felodipine  5 mg Oral Daily  . insulin aspart  0-9 Units Subcutaneous TID WC  . levothyroxine  50 mcg Oral QAC breakfast  . losartan  100 mg Oral Daily  . pantoprazole  40 mg Oral Daily   Continuous  Infusions:    LOS: 1 day    Time spent: 25 min    [place], DO [facility] Pager [phone]  If 7PM-7AM, please contact night-coverage [email or web address] Password [email or web address] 10/08/2016, 3:20 PM

## 2016-10-09 ENCOUNTER — Encounter (HOSPITAL_COMMUNITY): Payer: Self-pay | Admitting: Nurse Practitioner

## 2016-10-09 ENCOUNTER — Inpatient Hospital Stay (HOSPITAL_COMMUNITY)
Admission: RE | Admit: 2016-10-09 | Discharge: 2016-10-16 | DRG: 948 | Disposition: A | Discharge status: Home / Self Care | Payer: Medicare Other | Source: Intra-hospital | Attending: Physical Medicine & Rehabilitation | Admitting: Physical Medicine & Rehabilitation

## 2016-10-09 DIAGNOSIS — N182 Chronic kidney disease, stage 2 (mild): Secondary | ICD-10-CM

## 2016-10-09 DIAGNOSIS — F411 Generalized anxiety disorder: Secondary | ICD-10-CM | POA: Diagnosis not present

## 2016-10-09 DIAGNOSIS — I10 Essential (primary) hypertension: Secondary | ICD-10-CM | POA: Diagnosis present

## 2016-10-09 DIAGNOSIS — Z79899 Other long term (current) drug therapy: Secondary | ICD-10-CM

## 2016-10-09 DIAGNOSIS — Z885 Allergy status to narcotic agent status: Secondary | ICD-10-CM | POA: Diagnosis not present

## 2016-10-09 DIAGNOSIS — M6282 Rhabdomyolysis: Secondary | ICD-10-CM | POA: Diagnosis present

## 2016-10-09 DIAGNOSIS — Z880 Allergy status to penicillin: Secondary | ICD-10-CM

## 2016-10-09 DIAGNOSIS — D62 Acute posthemorrhagic anemia: Secondary | ICD-10-CM

## 2016-10-09 DIAGNOSIS — Z7984 Long term (current) use of oral hypoglycemic drugs: Secondary | ICD-10-CM

## 2016-10-09 DIAGNOSIS — R5381 Other malaise: Principal | ICD-10-CM | POA: Diagnosis present

## 2016-10-09 DIAGNOSIS — E118 Type 2 diabetes mellitus with unspecified complications: Secondary | ICD-10-CM

## 2016-10-09 DIAGNOSIS — E785 Hyperlipidemia, unspecified: Secondary | ICD-10-CM | POA: Diagnosis present

## 2016-10-09 DIAGNOSIS — Z853 Personal history of malignant neoplasm of breast: Secondary | ICD-10-CM

## 2016-10-09 DIAGNOSIS — E039 Hypothyroidism, unspecified: Secondary | ICD-10-CM | POA: Diagnosis present

## 2016-10-09 DIAGNOSIS — Z888 Allergy status to other drugs, medicaments and biological substances status: Secondary | ICD-10-CM

## 2016-10-09 DIAGNOSIS — Z8673 Personal history of transient ischemic attack (TIA), and cerebral infarction without residual deficits: Secondary | ICD-10-CM | POA: Diagnosis not present

## 2016-10-09 DIAGNOSIS — Z881 Allergy status to other antibiotic agents status: Secondary | ICD-10-CM | POA: Diagnosis not present

## 2016-10-09 DIAGNOSIS — E1122 Type 2 diabetes mellitus with diabetic chronic kidney disease: Secondary | ICD-10-CM | POA: Diagnosis present

## 2016-10-09 DIAGNOSIS — F332 Major depressive disorder, recurrent severe without psychotic features: Secondary | ICD-10-CM

## 2016-10-09 DIAGNOSIS — K219 Gastro-esophageal reflux disease without esophagitis: Secondary | ICD-10-CM | POA: Diagnosis present

## 2016-10-09 DIAGNOSIS — F329 Major depressive disorder, single episode, unspecified: Secondary | ICD-10-CM

## 2016-10-09 DIAGNOSIS — F419 Anxiety disorder, unspecified: Secondary | ICD-10-CM | POA: Diagnosis present

## 2016-10-09 DIAGNOSIS — G459 Transient cerebral ischemic attack, unspecified: Secondary | ICD-10-CM

## 2016-10-09 DIAGNOSIS — Z87891 Personal history of nicotine dependence: Secondary | ICD-10-CM | POA: Diagnosis not present

## 2016-10-09 DIAGNOSIS — R55 Syncope and collapse: Principal | ICD-10-CM

## 2016-10-09 LAB — GLUCOSE, CAPILLARY
GLUCOSE-CAPILLARY: 88 mg/dL (ref 65–99)
GLUCOSE-CAPILLARY: 151 mg/dL — AB (ref 65–99)
Glucose-Capillary: 149 mg/dL — ABNORMAL HIGH (ref 65–99)
Glucose-Capillary: 98 mg/dL (ref 65–99)

## 2016-10-09 LAB — CBC
HCT: 33.5 % — ABNORMAL LOW (ref 36.0–46.0)
Hemoglobin: 11.7 g/dL — ABNORMAL LOW (ref 12.0–15.0)
MCH: 33 pg (ref 26.0–34.0)
MCHC: 34.9 g/dL (ref 30.0–36.0)
MCV: 94.4 fL (ref 78.0–100.0)
PLATELETS: 230 10*3/uL (ref 150–400)
RBC: 3.55 MIL/uL — AB (ref 3.87–5.11)
RDW: 12.1 % (ref 11.5–15.5)
WBC: 6.9 10*3/uL (ref 4.0–10.5)

## 2016-10-09 LAB — CREATININE, SERUM
CREATININE: 1.04 mg/dL — AB (ref 0.44–1.00)
GFR calc non Af Amer: 49 mL/min — ABNORMAL LOW (ref >60)
GFR, EST AFRICAN AMERICAN: 57 mL/min — AB (ref >60)

## 2016-10-09 MED ORDER — ENOXAPARIN SODIUM 40 MG/0.4ML ~~LOC~~ SOLN
40.0000 mg | SUBCUTANEOUS | Status: DC
  Administered 2016-10-10 – 2016-10-16 (×7): 40 mg via SUBCUTANEOUS
  Filled 2016-10-09 (×9): qty 0.4

## 2016-10-09 MED ORDER — LOSARTAN POTASSIUM 50 MG PO TABS
100.0000 mg | ORAL_TABLET | Freq: Every day | ORAL | Status: DC
  Administered 2016-10-10 – 2016-10-16 (×7): 100 mg via ORAL
  Filled 2016-10-09 (×8): qty 2

## 2016-10-09 MED ORDER — ASPIRIN 325 MG PO TABS
325.0000 mg | ORAL_TABLET | Freq: Every day | ORAL | Status: DC
  Administered 2016-10-10 – 2016-10-16 (×7): 325 mg via ORAL
  Filled 2016-10-09 (×8): qty 1

## 2016-10-09 MED ORDER — SORBITOL 70 % SOLN
30.0000 mL | Freq: Every day | Status: DC | PRN
  Administered 2016-10-10 – 2016-10-13 (×2): 30 mL via ORAL
  Filled 2016-10-09 (×2): qty 30

## 2016-10-09 MED ORDER — PANTOPRAZOLE SODIUM 40 MG PO TBEC
40.0000 mg | DELAYED_RELEASE_TABLET | Freq: Every day | ORAL | Status: DC
  Administered 2016-10-10 – 2016-10-16 (×7): 40 mg via ORAL
  Filled 2016-10-09 (×7): qty 1

## 2016-10-09 MED ORDER — ATORVASTATIN CALCIUM 40 MG PO TABS
40.0000 mg | ORAL_TABLET | Freq: Every day | ORAL | Status: DC
Start: 2016-10-09 — End: 2022-03-30

## 2016-10-09 MED ORDER — ACETAMINOPHEN 325 MG PO TABS: 650.0000 mg | ORAL_TABLET | ORAL | Status: DC | PRN

## 2016-10-09 MED ORDER — FELODIPINE ER 5 MG PO TB24
5.0000 mg | ORAL_TABLET | Freq: Every day | ORAL | Status: DC
  Administered 2016-10-10 – 2016-10-16 (×7): 5 mg via ORAL
  Filled 2016-10-09 (×7): qty 1

## 2016-10-09 MED ORDER — ENOXAPARIN SODIUM 40 MG/0.4ML ~~LOC~~ SOLN: 40.0000 mg | SUBCUTANEOUS | Status: DC

## 2016-10-09 MED ORDER — IPRATROPIUM-ALBUTEROL 0.5-2.5 (3) MG/3ML IN SOLN: 3.0000 mL | RESPIRATORY_TRACT | Status: DC | PRN

## 2016-10-09 MED ORDER — ONDANSETRON HCL 4 MG PO TABS
4.0000 mg | ORAL_TABLET | Freq: Four times a day (QID) | ORAL | Status: DC | PRN
Start: 2016-10-09 — End: 2016-10-16
  Administered 2016-10-13 – 2016-10-15 (×2): 4 mg via ORAL
  Filled 2016-10-09 (×2): qty 1

## 2016-10-09 MED ORDER — LORAZEPAM 2 MG/ML IJ SOLN: 0.5000 mg | Freq: Once | INTRAMUSCULAR | Status: DC

## 2016-10-09 MED ORDER — LEVOTHYROXINE SODIUM 25 MCG PO TABS
50.0000 ug | ORAL_TABLET | Freq: Every day | ORAL | Status: DC
  Administered 2016-10-10 – 2016-10-16 (×7): 50 ug via ORAL
  Filled 2016-10-09: qty 1
  Filled 2016-10-09: qty 2
  Filled 2016-10-09 (×2): qty 1
  Filled 2016-10-09 (×3): qty 2

## 2016-10-09 MED ORDER — SENNOSIDES-DOCUSATE SODIUM 8.6-50 MG PO TABS
1.0000 | ORAL_TABLET | Freq: Every evening | ORAL | Status: DC | PRN
  Filled 2016-10-09 (×2): qty 1

## 2016-10-09 MED ORDER — ASPIRIN 325 MG PO TABS: 325.0000 mg | ORAL_TABLET | Freq: Every day | ORAL | Status: DC

## 2016-10-09 MED ORDER — ALPRAZOLAM 0.25 MG PO TABS
0.2500 mg | ORAL_TABLET | Freq: Three times a day (TID) | ORAL | Status: DC | PRN
  Administered 2016-10-09 – 2016-10-12 (×3): 0.25 mg via ORAL
  Filled 2016-10-09 (×3): qty 1

## 2016-10-09 MED ORDER — ACETAMINOPHEN 325 MG PO TABS
650.0000 mg | ORAL_TABLET | ORAL | Status: DC | PRN
  Administered 2016-10-12 – 2016-10-15 (×4): 650 mg via ORAL
  Filled 2016-10-09 (×5): qty 2

## 2016-10-09 MED ORDER — ATORVASTATIN CALCIUM 40 MG PO TABS
40.0000 mg | ORAL_TABLET | Freq: Every day | ORAL | Status: DC
  Administered 2016-10-10 – 2016-10-15 (×6): 40 mg via ORAL
  Filled 2016-10-09 (×7): qty 1

## 2016-10-09 MED ORDER — ONDANSETRON HCL 4 MG/2ML IJ SOLN: 4.0000 mg | Freq: Four times a day (QID) | INTRAMUSCULAR | Status: DC | PRN

## 2016-10-09 MED ORDER — INSULIN ASPART 100 UNIT/ML ~~LOC~~ SOLN
0.0000 [IU] | Freq: Three times a day (TID) | SUBCUTANEOUS | Status: DC
  Administered 2016-10-10 (×3): 1 [IU] via SUBCUTANEOUS
  Administered 2016-10-11 – 2016-10-12 (×2): 2 [IU] via SUBCUTANEOUS
  Administered 2016-10-12: 1 [IU] via SUBCUTANEOUS
  Administered 2016-10-14 – 2016-10-15 (×4): 2 [IU] via SUBCUTANEOUS

## 2016-10-09 NOTE — Progress Notes (Signed)
Rehab admissions - I met wit patient and her sister this morning.  They are agreeable to inpatient rehab admission.  Patient has been cleared by attending MD.  Bed available and will admit to acute inpatient rehab today.  Call me for questions.  #[phone]

## 2016-10-09 NOTE — H&P (Signed)
Physical Medicine and Rehabilitation Admission H&P    Chief Complaint  Patient presents with  . Fall  . Altered Mental Status  : HPI: [name] is a 82 y.o. right handed female with history of diabetes mellitus, hypertension, breast cancer, CVA with residual tremor right side. Per chart review patient lives alone and was independent with ADLs and mobility prior to admission with progressive decline over the past 6 weeks. Her husband has dementia and is currently in a skilled nursing facility. Presented 10/06/2016 after being found face down on the kitchen floor of her home by her sister with altered mental status as well as bouts of nausea and vomiting. Reports of progressive weakness over the past 6 weeks. Recent imaging of abdomen by PCP unremarkable. Patient was scheduled to have an upper GI series performed prior to this latest event. Patient has had a 40 pound weight loss since the beginning of January. Blood pressure elevated 172/116, WBC 16,400, creatinine 1.08, CPK 2341. Troponin negative. CT and imaging revealed hypoattenuation of the left frontoparietal  lobe. Patient did not receive TPA. MRI/MRA of the brain showed no acute finding. Chronic microvascular disease and atrophy. There was a 2 mm right ICA aneurysm. EEG negative for seizure. Carotid Dopplers completed showing right internal carotid artery 40-59% stenosis. Echocardiogram with 60% ejection fraction grade 1 diastolic dysfunction without emboli. UGI with KUB no esophageal stricture or mass. Mild esophageal reflux. Normal duodenum.Marland Kitchen Neurology consulted, maintained on aspirin for CVA prophylaxis. Subcutaneous Lovenox for DVT prophylaxis. Maintain on a regular diet.Physical and Occupational therapy evaluation completed with recommendations of physical medicine rehabilitation consultPatient was admitted for comprehensive rehabilitation program  ROS Constitutional: Positive for weight loss.  HENT: Negative for hearing loss and  tinnitus.   Eyes: Negative for blurred vision and double vision.  Respiratory: Negative for cough and shortness of breath.  Cardiovascular: Negative for chest pain, palpitations and leg swelling.  Gastrointestinal: Positive for nausea and vomiting.  Genitourinary: Negative for dysuria and hematuria.  Musculoskeletal: Positive for back pain and falls.  Skin: Negative for rash.  Neurological: Positive for tremors and weakness.  Psychiatric/Behavioral:       Anxiety  All other systems reviewed and are negative   Past Medical History:  Diagnosis Date  . Arthritis   . Cancer [facility])    breast cancer on left - 10  years ago  . Diabetes mellitus without complication ([place])   . GERD (gastroesophageal reflux disease)   . Hypertension   . Hypothyroidism   . Stroke ([place])    2014  . Wears glasses    Past Surgical History:  Procedure Laterality Date  . ABDOMINAL HYSTERECTOMY    . APPENDECTOMY    . ARCUATE KERATECTOMY    . BUNIONECTOMY     right  . CARPAL TUNNEL RELEASE     right  . CARPAL TUNNEL RELEASE Left 05/24/2014   Procedure: LEFT CARPAL TUNNEL RELEASE LEFT THUMB TRIGGER FINGER RELEASE;  Surgeon: Cammie Sickle, MD;  Location: [facility];  Service: Orthopedics;  Laterality: Left;  left thumb also site of incision  . DEBRIDEMENT OF ABDOMINAL WALL ABSCESS  1978  . OVARIAN CYST SURGERY     x2-  . TONSILLECTOMY    . TRIGGER FINGER RELEASE Left 01/25/2016   Procedure: RELEASE A-1 PULLEY LEFT RING FINGER, LEFT SMALL FINGER;  Surgeon: Daryll Brod, MD;  Location: [place];  Service: Orthopedics;  Laterality: Left;  ANESTHESIA: IV REGIONAL FAB  History reviewed. No pertinent family history.of TIA. Social History:  reports that she has quit smoking. Her smoking use included Cigarettes. She has never used smokeless tobacco. She reports that she drinks alcohol. She reports that she does not use drugs. Allergies:  Allergies  Allergen Reactions  . Tramadol  Anaphylaxis  . Hydrocodone-Acetaminophen Other (See Comments)  . Morphine And Related Nausea And Vomiting  . Penicillins Swelling  . Sitagliptin Other (See Comments)  . Tetracyclines & Related Swelling    Face and eyes swells   Medications Prior to Admission  Medication Sig Dispense Refill  . ALPRAZolam (XANAX) 0.25 MG tablet Take 0.25 mg by mouth 3 (three) times daily.    . felodipine (PLENDIL) 5 MG 24 hr tablet Take 5 mg by mouth daily.    Marland Kitchen levothyroxine (SYNTHROID, LEVOTHROID) 50 MCG tablet Take 50 mcg by mouth daily before breakfast.    . losartan (COZAAR) 100 MG tablet Take 100 mg by mouth daily.    . metFORMIN (GLUCOPHAGE-XR) 750 MG 24 hr tablet Take 750 mg by mouth daily with breakfast.    . ondansetron (ZOFRAN) 8 MG tablet Take 8 mg by mouth every 8 (eight) hours as needed for nausea.    . promethazine (PHENERGAN) 25 MG tablet Take 25 mg by mouth every 6 (six) hours as needed for nausea or vomiting.    . Ascorbic Acid (VITAMIN C ER PO) Take by mouth daily.    . Coenzyme Q10 (CO Q-10) 100 MG CAPS Take by mouth.    . Fish Oil OIL by Does not apply route daily.    . Multiple Vitamins-Minerals (MULTIVITAMIN WITH MINERALS) tablet Take 1 tablet by mouth daily.    Marland Kitchen omeprazole (PRILOSEC) 20 MG capsule Take 20 mg by mouth daily.    . Red Yeast Rice Extract (RED YEAST RICE PO) Take by mouth daily.    Marland Kitchen senna-docusate (SENOKOT-S) 8.6-50 MG per tablet Take 1 tablet by mouth 2 (two) times daily. (Patient not taking: Reported on 10/06/2016) 30 tablet 0  . simvastatin (ZOCOR) 20 MG tablet Take 20 mg by mouth daily.    . traMADol (ULTRAM) 50 MG tablet Take 1 tablet (50 mg total) by mouth every 6 (six) hours as needed. 20 tablet 0  . UNABLE TO FIND Magnilife pain relieving foot cream,as needed    . vitamin B-12 (CYANOCOBALAMIN) 250 MCG tablet Take 250 mcg by mouth daily.      Home: Home Living Family/patient expects to be discharged to:: Private residence Living Arrangements:  Alone Available Help at Discharge: Family, Friend(s), Available PRN/intermittently Type of Home: House Home Access: Level entry [place]: One level Bathroom Shower/Tub: Multimedia programmer: Standard Bathroom Accessibility: Yes Home Equipment: Industrial/product designer History: Prior Function Level of Independence: Independent Comments: Per sister still drives  Functional Status:  Mobility: Bed Mobility Overal bed mobility: Needs Assistance Bed Mobility: Supine to Sit Supine to sit: Min assist General bed mobility comments: simple directions and gestures A with cueing pt to bring herself to sitting at EOB.  pt only needed MinA for balance.   Transfers Overall transfer level: Needs assistance Equipment used: Rolling walker (2 wheeled) Transfers: Sit to/from Stand, [employer] Transfers Sit to Stand: Mod assist Stand pivot transfers: Min assist General transfer comment: cues for UE use and step-by-step through pivot to 3-in-1.   Ambulation/Gait Ambulation/Gait assistance: Min assist Ambulation Distance (Feet): 50 Feet Assistive device: Rolling walker (2 wheeled) Gait Pattern/deviations: Step-through pattern, Decreased stride length, Shuffle,  Drifts right/left, Trunk flexed General Gait Details: pt tends to drift to L side and stands to L side of RW.  pt needs A for management of RW.  pt unaware of deficits during gait despite running into objects on L side.  Multiple cues for positioning within RW and more upright posture.      ADL: ADL Overall ADL's : Needs assistance/impaired Eating/Feeding: Minimal assistance, Sitting Grooming: Minimal assistance, Brushing hair, Wash/dry hands, Wash/dry face Upper Body Bathing: Minimal assitance, Sitting Lower Body Bathing: Moderate assistance, Sit to/from stand Upper Body Dressing : Moderate assistance, Sitting Lower Body Dressing: Moderate assistance, Sit to/from stand Toilet Transfer: Moderate assistance,  Stand-pivot Toileting- Clothing Manipulation and Hygiene: Moderate assistance Functional mobility during ADLs: Moderate assistance  Cognition: Cognition Overall Cognitive Status: Impaired/Different from baseline Orientation Level: Oriented to person, Disoriented to place, Disoriented to time, Disoriented to situation Cognition Arousal/Alertness: Awake/alert Behavior During Therapy: Restless Overall Cognitive Status: Impaired/Different from baseline Area of Impairment: Orientation, Attention, Memory, Following commands, Safety/judgement, Awareness, Problem solving Orientation Level: Disoriented to, Time, Situation Current Attention Level: Sustained Memory: Decreased recall of precautions, Decreased short-term memory Following Commands: Follows one step commands inconsistently, Follows one step commands with increased time Safety/Judgement: Decreased awareness of safety, Decreased awareness of deficits Awareness: Intellectual Problem Solving: Slow processing, Decreased initiation, Difficulty sequencing, Requires verbal cues, Requires tactile cues General Comments: pt difficult to stay on task and often fidgeting with hands grabbing lines and gown.    Physical Exam: Blood pressure (!) 152/70, pulse (!) 105, temperature 97.9 F (36.6 C), temperature source Oral, resp. rate 18, height 5\' 4"  (1.626 m), weight 65.8 kg (145 lb), SpO2 97 %. Physical Exam  Constitutional: She appears well-developed.  82 year old right-handed female sitting up in chair. Patient was some confusion needed redirection.  HENT:  Head: Normocephalic.  Multiple bruises to the face  Eyes: Conjunctivae and EOM are normal.  Neck: Normal range of motion. Neck supple. No thyromegaly present.  Cardiovascular: Normal rate and regular rhythm.   Respiratory: Effort normal and breath sounds normal. No respiratory distress.  GI: Soft. Bowel sounds are normal. She exhibits no distension.  Musculoskeletal: She exhibits no edema  or tenderness.  Patient moving all extremities with normal range of motion.  Neurological: She is alert.  Patient is alert but anxious.  She is able to provide her name and her age and place. She could not recall the name of the skilled nursing facility her husband is currently at. Followed simple commands Motor: 4+/5 throughout. No ataxia Sensation intact to light touch DTRs symmetric.  Skin: Skin is warm and dry.  Psychiatric: She has a normal mood and affect.    Results for orders placed or performed during the hospital encounter of 10/06/16 (from the past 48 hour(s))  Glucose, capillary     Status: None   Collection Time: 10/07/16 12:20 PM  Result Value Ref Range   Glucose-Capillary 90 65 - 99 mg/dL   Comment 1 Notify RN    Comment 2 Document in Chart   Glucose, capillary     Status: Abnormal   Collection Time: 10/07/16  5:52 PM  Result Value Ref Range   Glucose-Capillary 133 (H) 65 - 99 mg/dL  Glucose, capillary     Status: Abnormal   Collection Time: 10/07/16  9:44 PM  Result Value Ref Range   Glucose-Capillary 112 (H) 65 - 99 mg/dL   Comment 1 Notify RN    Comment 2 Document in Chart   Glucose, capillary  Status: None   Collection Time: 10/08/16  6:18 AM  Result Value Ref Range   Glucose-Capillary 96 65 - 99 mg/dL   Comment 1 Notify RN    Comment 2 Document in Chart   CK     Status: Abnormal   Collection Time: 10/08/16  8:12 AM  Result Value Ref Range   Total CK 681 (H) 38 - 234 U/L  Glucose, capillary     Status: Abnormal   Collection Time: 10/08/16 11:25 AM  Result Value Ref Range   Glucose-Capillary 160 (H) 65 - 99 mg/dL   Comment 1 Notify RN    Comment 2 Document in Chart   Glucose, capillary     Status: Abnormal   Collection Time: 10/08/16  5:07 PM  Result Value Ref Range   Glucose-Capillary 160 (H) 65 - 99 mg/dL   Comment 1 Notify RN    Comment 2 Document in Chart   Glucose, capillary     Status: None   Collection Time: 10/08/16  9:22 PM  Result  Value Ref Range   Glucose-Capillary 88 65 - 99 mg/dL  Glucose, capillary     Status: None   Collection Time: 10/09/16  6:25 AM  Result Value Ref Range   Glucose-Capillary 88 65 - 99 mg/dL   Dg Ugi  W/kub  Result Date: 10/08/2016 CLINICAL DATA:  Difficulty swallowing.  Dysphagia. EXAM: UPPER GI SERIES WITH KUB TECHNIQUE: After obtaining a scout radiograph a routine upper GI series was performed using thin barium FLUOROSCOPY TIME:  Fluoroscopy Time:  1 minutes 18 seconds Radiation Exposure Index (if provided by the fluoroscopic device): 0.9 mGy Number of Acquired Spot Images: 21 COMPARISON:  MRI upper abdomen 09/17/2016 FINDINGS: Exam was limited due to patient immobility. Patient was examined in a 30 degree supine position. Initial KUB demonstrates no bowel obstruction. No mucosal irregularity stricture or mass of the esophagus. The GE junction was widely patent. Mild gastroesophageal reflux. There is no mucosal irregularity, stricture or mass of the stomach. The duodenum bulb is normal. The C-loop and more distal third and fourth portion the duodenum is normal. IMPRESSION: 1. No esophageal stricture mass or obstruction. 2. Mild esophageal reflux. 3. Normal stomach without evidence of mass or obstruction. 4. Normal duodenum. 5. Exam is somewhat limited due to patient immobility and single contrast. Electronically Signed   By: Suzy Bouchard M.D.   On: 10/08/2016 10:28   Medical Problem List and Plan: 1.  Debilitation secondary to rhabdomyolysis/multi-medical 2.  DVT Prophylaxis/Anticoagulation: Subcutaneous Lovenox. Monitor platelet counts and any signs of bleeding 3. Pain Management: Tylenol as needed 4. Mood: Xanax 0.25 mg 3 times a day as needed 5. Neuropsych: This patient is not fully capable of making decisions on her own behalf. 6. Skin/Wound Care: Routine skin checks 7. Fluids/Electrolytes/Nutrition: Routine I&O with follow-up chemistries 8. Hypertension. Plendil 5 mg daily, Cozaar 100 mg  daily. Monitor with increased mobility 9. Diabetes mellitus. Hemoglobin A1c 6.2. Check blood sugars before meals and at bedtime.SSI. Patient on Glucophage 750 mg daily prior to admission. Resume as tolerated 10. Hypothyroidism. Synthroid. Latest TSH 1.510 11. Hyperlipidemia. Lipitor 12. History of left breast cancer. 13. ABLA: Follow CBC 14. CKD: Follow BMP  Post Admission Physician Evaluation: 1. Preadmission assessment reviewed and changes made below. 2. Functional deficits secondary  to rhabdomyolysis/multi-medical. 3. Patient is admitted to receive collaborative, interdisciplinary care between the physiatrist, rehab nursing staff, and therapy team. 4. Patient's level of medical complexity and substantial therapy needs in context of  that medical necessity cannot be provided at a lesser intensity of care such as a SNF. 5. Patient has experienced substantial functional loss from his/her baseline which was documented above under the "Functional History" and "Functional Status" headings.  Judging by the patient's diagnosis, physical exam, and functional history, the patient has potential for functional progress which will result in measurable gains while on inpatient rehab.  These gains will be of substantial and practical use upon discharge  in facilitating mobility and self-care at the household level. 6. Physiatrist will provide 24 hour management of medical needs as well as oversight of the therapy plan/treatment and provide guidance as appropriate regarding the interaction of the two. 7. The Preadmission Screening has been reviewed and patient status is unchanged unless otherwise stated above. 8. 24 hour rehab nursing will assist with bladder management, safety, skin/wound care, disease management, pain management and patient education  and help integrate therapy concepts, techniques,education, etc. 9. PT will assess and treat for/with: Lower extremity strength, range of motion, stamina,  balance, functional mobility, safety, adaptive techniques and equipment, woundcare, coping skills, pain control, education.   Goals are: Mod I. 10. OT will assess and treat for/with: ADL's, functional mobility, safety, upper extremity strength, adaptive techniques and equipment, wound mgt, ego support, and community reintegration.   Goals are: Mod I. Therapy may proceed with showering this patient. 11. Case Management and Social Worker will assess and treat for psychological issues and discharge planning. 12. Team conference will be held weekly to assess progress toward goals and to determine barriers to discharge. 13. Patient will receive at least 3 hours of therapy per day at least 5 days per week. 14. ELOS: 6-9 days.       15. Prognosis:  excellent  Delice Lesch, MD, Mellody Drown 10/09/2016

## 2016-10-09 NOTE — Discharge Summary (Addendum)
Physician Discharge Summary  [name] [id number] DOB: 06-21-1934 DOA: 10/06/2016  PCP: Mathews Argyle, MD  Admit date: 10/06/2016 Discharge date: 10/09/2016   Recommendations for Outpatient Follow-Up:   1. To CIR 2. Event monitor upon d/c from CIR   Discharge Diagnosis:   Active Problems:   Aphasia   Leukocytosis   Essential hypertension   Hypothyroidism   GERD (gastroesophageal reflux disease)   Hyperlipemia   Syncope   Discharge disposition:  Home  Discharge Condition: Improved.  Diet recommendation: Low sodium, heart healthy.  Carbohydrate-modified  Wound care: None.   History of Present Illness:   [name] is a 82 y.o. 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 Much of the history is obtained by the patient's sister as the patient is currently unable to provide her own history at this time. At baseline the patient was noted to be very independent and lived alone. Patient was last seen normal sometime around 12 noon yesterday. She had been taken to the urgent care for nausea vomiting symptoms and prescribed promethazine.  Patient had been suffering from nausea and vomiting since January of this year intermittently. However, over the last 6 weeks or so symptoms have been progressively worse. She had been evaluated by her primary care provider with MRI of the abdomen showed no overt cause symptoms. Patient was scheduled to have a upper GI series performed tomorrow. The patient's sister notes that she had been trying to call her last night, but the patient did not answer. When she found her this morning she was still in her pajamas, her leg was jerking, and by the appearance of the kitchen it seemed as if she had been trying to feed the cat. The patient had complained of some  diarrhea/constipation and approximately 40 pound weight loss since the beginning of the year.    Hospital Course by Problem:   TIA  - MRI negative -  hemoglobin A1c 6.2 and lipid panel LDL 80 - ASA - EEG ok - Appreciate neurology consultation  Syncopal episode-- ? Phenergan related -echo done -outpatient event monitor  Abdominal pain/nausea -UGI with KUB done and shows mild GERD -outpatient follow up with Dr. Paulita Fujita for further recommendations  Rhabdomyolysis: -resolved with IVF  Leukocytosis: -resolved -no sign of infection  Essential HTN - continue losartanand felodipine   Hypothyroidism - TSH ok - continue Levothyroxine  Diabetesmellitus type II - resume home meds  Hyperlipidemia - Continue statin  Jerrye Bushy - continue protonix     Medical Consultants:    Neuro   Discharge Exam:   Vitals:   10/09/16 0500 10/09/16 0931  BP: 134/68 (!) 152/70  Pulse: 84 (!) 105  Resp: 18 18  Temp: 97.8 F (36.6 C) 97.9 F (36.6 C)   Vitals:   10/08/16 2100 10/09/16 0100 10/09/16 0500 10/09/16 0931  BP: 128/66 118/74 134/68 (!) 152/70  Pulse: 82 74 84 (!) 105  Resp: 16 16 18 18   Temp: 98 F (36.7 C) 97.7 F (36.5 C) 97.8 F (36.6 C) 97.9 F (36.6 C)  TempSrc: Oral Oral Oral Oral  SpO2: 96% 96% 98% 97%  Weight:      Height:        Gen:  NAD  The results of significant diagnostics from this hospitalization (including imaging, microbiology, ancillary and laboratory)  are listed below for reference.     Procedures and Diagnostic Studies:   Ct Head Wo Contrast  Result Date: 10/06/2016 CLINICAL DATA:  Altered mental status. Patient is status post fall with trauma to the left side of her head. EXAM: CT HEAD WITHOUT CONTRAST CT CERVICAL SPINE WITHOUT CONTRAST TECHNIQUE: Multidetector CT imaging of the head and cervical spine was performed following the standard protocol without intravenous contrast. Multiplanar CT image reconstructions of the  cervical spine were also generated. COMPARISON:  None. FINDINGS: CT HEAD FINDINGS Brain: No evidence of acute hemorrhage, hydrocephalus, extra-axial collection or mass lesion/mass effect. Subtle area of hypoattenuation in the subcortical left frontoparietal lobe may represent an acute ischemic infarction. Moderate brain parenchymal volume loss and microangiopathy noted. Vascular: Atherosclerotic calcifications at the skullbase. Skull: Normal. Negative for fracture or focal lesion. Sinuses/Orbits: No acute finding. Other: None. CT CERVICAL SPINE FINDINGS The study is degraded by motion artifact. Alignment: Reversal of physiologic lordosis likely secondary to extensive multifocal osteoarthritic changes of the cervical spine. Skull base and vertebrae: No evidence of fracture. Soft tissues and spinal canal: Ligamentous thickening posterior to the dens, likely degenerative. Disc levels: Moderate to severe osteoarthritic changes with disc space narrowing, remodeling of vertebral bodies, osteophyte formation. Similarly posterior facet arthropathy at all levels. Upper chest: Negative. Other: None. IMPRESSION: Area of hypoattenuation in the subcortical left frontoparietal lobe may represent acute ischemic infarction. Moderate brain parenchymal volume loss and microangiopathy. No evidence of acute traumatic injury to the cervical spine. Extensive spondylosis of the cervical spine with reversal of physiologic lordosis. Please note that the cervical spine CT is degraded by motion artifact due to patient's inability to cooperate. These results were called by telephone at the time of interpretation on 10/06/2016 at 6:25 pm to Dr. Shirlyn Goltz , who verbally acknowledged these results. Electronically Signed   By: Fidela Salisbury M.D.   On: 10/06/2016 18:28   Ct Cervical Spine Wo Contrast  Result Date: 10/06/2016 CLINICAL DATA:  Altered mental status. Patient is status post fall with trauma to the left side of her head. EXAM:  CT HEAD WITHOUT CONTRAST CT CERVICAL SPINE WITHOUT CONTRAST TECHNIQUE: Multidetector CT imaging of the head and cervical spine was performed following the standard protocol without intravenous contrast. Multiplanar CT image reconstructions of the cervical spine were also generated. COMPARISON:  None. FINDINGS: CT HEAD FINDINGS Brain: No evidence of acute hemorrhage, hydrocephalus, extra-axial collection or mass lesion/mass effect. Subtle area of hypoattenuation in the subcortical left frontoparietal lobe may represent an acute ischemic infarction. Moderate brain parenchymal volume loss and microangiopathy noted. Vascular: Atherosclerotic calcifications at the skullbase. Skull: Normal. Negative for fracture or focal lesion. Sinuses/Orbits: No acute finding. Other: None. CT CERVICAL SPINE FINDINGS The study is degraded by motion artifact. Alignment: Reversal of physiologic lordosis likely secondary to extensive multifocal osteoarthritic changes of the cervical spine. Skull base and vertebrae: No evidence of fracture. Soft tissues and spinal canal: Ligamentous thickening posterior to the dens, likely degenerative. Disc levels: Moderate to severe osteoarthritic changes with disc space narrowing, remodeling of vertebral bodies, osteophyte formation. Similarly posterior facet arthropathy at all levels. Upper chest: Negative. Other: None. IMPRESSION: Area of hypoattenuation in the subcortical left frontoparietal lobe may represent acute ischemic infarction. Moderate brain parenchymal volume loss and microangiopathy. No evidence of acute traumatic injury to the cervical spine. Extensive spondylosis of the cervical spine with reversal of physiologic lordosis. Please note that the cervical spine CT is degraded by motion artifact due to patient's inability to cooperate.  These results were called by telephone at the time of interpretation on 10/06/2016 at 6:25 pm to Dr. Shirlyn Goltz , who verbally acknowledged these results.  Electronically Signed   By: Fidela Salisbury M.D.   On: 10/06/2016 18:28   Mr Brain Wo Contrast  Result Date: 10/07/2016 CLINICAL DATA:  Found on floor.  Possible stroke. EXAM: MRI HEAD WITHOUT CONTRAST MRA HEAD WITHOUT CONTRAST TECHNIQUE: Multiplanar, multiecho pulse sequences of the brain and surrounding structures were obtained without intravenous contrast. Angiographic images of the head were obtained using MRA technique without contrast. COMPARISON:  Head CT from yesterday. FINDINGS: MRI HEAD FINDINGS Brain: No acute infarction, hemorrhage, hydrocephalus, extra-axial collection or mass lesion. Ventriculomegaly that is likely central predominant atrophy. Mesial temporal volume loss is mild-to-moderate. No third ventricular ballooning. Small-vessel ischemic changes in cerebral white matter, with a thin confluent band around the lateral ventricles. Vascular: Normal flow voids. Skull and upper cervical spine: Hypo intense appearance of the dens on T1 weighted imaging is from sclerosis. No focal marrow lesion. Sinuses/Orbits: Cataract resection. Other: Motion degraded study, best obtainable in a setting. MRA HEAD FINDINGS Tortuous right cervical ICA. Symmetric carotid arteries and branching. No major branch occlusion or flow limiting stenosis. No unusual vertebrobasilar branching. No flow limiting stenosis or major branch occlusion. 2 mm outpouching directed inferiorly from the right supraclinoid ICA, with no visible neighboring vessel. IMPRESSION: 1. No acute finding, including infarct. 2. Chronic microvascular disease and atrophy. 3. No acute finding on intracranial MRA.  No flow limiting stenosis. 4. 2 mm right supraclinoid ICA aneurysm or infundibulum. Electronically Signed   By: Monte Fantasia M.D.   On: 10/07/2016 06:44   Dg Chest Port 1 View  Result Date: 10/06/2016 CLINICAL DATA:  Altered mental status EXAM: PORTABLE CHEST 1 VIEW COMPARISON:  03/21/2014 FINDINGS: The heart size and mediastinal  contours are within normal limits. Both lungs are clear. The visualized skeletal structures are unremarkable. IMPRESSION: No active disease. Electronically Signed   By: Kathreen Devoid   On: 10/06/2016 16:15   Mr Jodene Nam Head/brain Wo Cm  Result Date: 10/07/2016 CLINICAL DATA:  Found on floor.  Possible stroke. EXAM: MRI HEAD WITHOUT CONTRAST MRA HEAD WITHOUT CONTRAST TECHNIQUE: Multiplanar, multiecho pulse sequences of the brain and surrounding structures were obtained without intravenous contrast. Angiographic images of the head were obtained using MRA technique without contrast. COMPARISON:  Head CT from yesterday. FINDINGS: MRI HEAD FINDINGS Brain: No acute infarction, hemorrhage, hydrocephalus, extra-axial collection or mass lesion. Ventriculomegaly that is likely central predominant atrophy. Mesial temporal volume loss is mild-to-moderate. No third ventricular ballooning. Small-vessel ischemic changes in cerebral white matter, with a thin confluent band around the lateral ventricles. Vascular: Normal flow voids. Skull and upper cervical spine: Hypo intense appearance of the dens on T1 weighted imaging is from sclerosis. No focal marrow lesion. Sinuses/Orbits: Cataract resection. Other: Motion degraded study, best obtainable in a setting. MRA HEAD FINDINGS Tortuous right cervical ICA. Symmetric carotid arteries and branching. No major branch occlusion or flow limiting stenosis. No unusual vertebrobasilar branching. No flow limiting stenosis or major branch occlusion. 2 mm outpouching directed inferiorly from the right supraclinoid ICA, with no visible neighboring vessel. IMPRESSION: 1. No acute finding, including infarct. 2. Chronic microvascular disease and atrophy. 3. No acute finding on intracranial MRA.  No flow limiting stenosis. 4. 2 mm right supraclinoid ICA aneurysm or infundibulum. Electronically Signed   By: Monte Fantasia M.D.   On: 10/07/2016 06:44     Labs:   Basic  Metabolic Panel:  Recent  Labs Lab 10/06/16 1547 10/06/16 1559 10/07/16 0706  NA 137 138 140  K 3.8 3.8 3.8  CL 98* 100* 106  CO2 26  --  25  GLUCOSE 120* 112* 95  BUN 19 20 17   CREATININE 1.08* 1.10* 1.06*  CALCIUM 9.8  --  9.0   GFR Estimated Creatinine Clearance: 39.5 mL/min (by C-G formula based on SCr of 1.06 mg/dL (H)). Liver Function Tests:  Recent Labs Lab 10/06/16 1547  AST 50*  ALT 21  ALKPHOS 71  BILITOT 1.0  PROT 7.3  ALBUMIN 4.3   No results for input(s): LIPASE, AMYLASE in the last 168 hours. No results for input(s): AMMONIA in the last 168 hours. Coagulation profile  Recent Labs Lab 10/06/16 1547  INR 0.96    CBC:  Recent Labs Lab 10/06/16 1547 10/06/16 1559 10/07/16 0706  WBC 16.4*  --  9.5  NEUTROABS 13.5*  --   --   HGB 13.5 13.3 12.2  HCT 38.8 39.0 36.2  MCV 93.9  --  96.8  PLT 267  --  223   Cardiac Enzymes:  Recent Labs Lab 10/06/16 1547 10/07/16 0706 10/08/16 0812  CKTOTAL 2,341* 1,584* 681*   BNP: Invalid input(s): POCBNP CBG:  Recent Labs Lab 10/08/16 0618 10/08/16 1125 10/08/16 1707 10/08/16 2122 10/09/16 0625  GLUCAP 96 160* 160* 88 88   D-Dimer No results for input(s): DDIMER in the last 72 hours. Hgb A1c  Recent Labs  10/07/16 0706  HGBA1C 6.2*   Lipid Profile  Recent Labs  10/07/16 0706  CHOL 154  HDL 52  LDLCALC 80  TRIG 108  CHOLHDL 3.0   Thyroid function studies  Recent Labs  10/07/16 0706  TSH 1.510   Anemia work up No results for input(s): VITAMINB12, FOLATE, FERRITIN, TIBC, IRON, RETICCTPCT in the last 72 hours. Microbiology No results found for this or any previous visit (from the past 240 hour(s)).   Discharge Instructions:   Discharge Instructions    Diet - low sodium heart healthy    Complete by:  As directed    Diet Carb Modified    Complete by:  As directed    Increase activity slowly    Complete by:  As directed        Medication List    STOP taking these medications     promethazine 25 MG tablet Commonly known as:  PHENERGAN   RED YEAST RICE PO   simvastatin 20 MG tablet Commonly known as:  ZOCOR   UNABLE TO FIND     TAKE these medications   acetaminophen 325 MG tablet Commonly known as:  TYLENOL Take 2 tablets (650 mg total) by mouth every 4 (four) hours as needed for mild pain, fever or headache.   ALPRAZolam 0.25 MG tablet Commonly known as:  XANAX Take 0.25 mg by mouth 3 (three) times daily.   aspirin 325 MG tablet Take 1 tablet (325 mg total) by mouth daily.   atorvastatin 40 MG tablet Commonly known as:  LIPITOR Take 1 tablet (40 mg total) by mouth daily at 6 PM.   Co Q-10 100 MG Caps Take by mouth.   felodipine 5 MG 24 hr tablet Commonly known as:  PLENDIL Take 5 mg by mouth daily.   Fish Oil Oil by Does not apply route daily.   levothyroxine 50 MCG tablet Commonly known as:  SYNTHROID, LEVOTHROID Take 50 mcg by mouth daily before breakfast.   losartan 100 MG  tablet Commonly known as:  COZAAR Take 100 mg by mouth daily.   metFORMIN 750 MG 24 hr tablet Commonly known as:  GLUCOPHAGE-XR Take 750 mg by mouth daily with breakfast.   multivitamin with minerals tablet Take 1 tablet by mouth daily.   omeprazole 20 MG capsule Commonly known as:  PRILOSEC Take 20 mg by mouth daily.   ondansetron 8 MG tablet Commonly known as:  ZOFRAN Take 8 mg by mouth every 8 (eight) hours as needed for nausea.   senna-docusate 8.6-50 MG tablet Commonly known as:  Senokot-S Take 1 tablet by mouth 2 (two) times daily.   traMADol 50 MG tablet Commonly known as:  ULTRAM Take 1 tablet (50 mg total) by mouth every 6 (six) hours as needed.   vitamin B-12 250 MCG tablet Commonly known as:  CYANOCOBALAMIN Take 250 mcg by mouth daily.   VITAMIN C ER PO Take by mouth daily.         Time coordinating discharge: 35 min  Signed:  Chinedu Agustin U Deztiny Sarra   [facility] 10/09/2016, 11:05 AM

## 2016-10-09 NOTE — Progress Notes (Signed)
Retta Diones, RN Rehab Admission Coordinator Signed Physical Medicine and Rehabilitation  PMR Pre-admission Date of Service: 10/09/2016 11:49 AM  Related encounter: ED to Hosp-Admission (Current) from 10/06/2016 in [place]       [] Hide copied text PMR Admission Coordinator Pre-Admission Assessment  Patient: [name] is an 82 y.o., female MRN: [id number] DOB: [birth year] Height: 5\' 4"  (162.6 cm) Weight: 65.8 kg (145 lb)                                                                                                                                                  Insurance Information HMO:No   PPO:       PCP:       IPA:       80/20:       OTHER:   PRIMARY:  Medicare A/B      Policy#: [id number]      Subscriber: Delton See CM Name:        Phone#:       Fax#:   Pre-Cert#:        Employer: REtired Benefits:  Phone #:       Name: Checked in [place]. Date: 07/26/01     Deduct:  $1316      Out of Pocket Max: none      Life Max: unlimited CIR: 100%      SNF: 100 days Outpatient: 80%     Co-Pay: 20% Home Health: 100%      Co-Pay: none DME: 80%     Co-Pay: 20% Providers: patient's choice  SECONDARY:  [employer]      Policy#: [id number]      Subscriber:  Delton See CM Name:        Phone#:       Fax#:   Pre-Cert#:        Employer:  Retired Benefits:  Phone #:  [phone]     Name:   Eff. Date:       Deduct:        Out of Pocket Max:        Life Max:   CIR:        SNF:   Outpatient:       Co-Pay:   Home Health:        Co-Pay:   DME:       Co-Pay:    Emergency Contact Information        Contact Information    Name Relation Home Work Mobile   [place] Sister [phone]  Ryan    [phone]     Current Medical History  Patient Admitting Diagnosis:  Debility, Rhabdo post fall  History of Present Illness: An 82 y.o.right handed femalewith history of diabetes mellitus, hypertension,  breast cancer, CVA with residual tremor right side. Per  chart review patient lives alone and was independent with ADLs and mobility prior to admission with progressive decline over the past 6 weeks. Her husband has dementia and is currently in a skilled nursing facility. Presented 10/06/2016 after being found face down on the kitchen floor of her home by her sister with altered mental status as well as bouts of nausea and vomiting. Reports of progressive weakness over the past 6 weeks. Recent imaging of abdomen by PCP unremarkable. Patient was scheduled to have an upper GI series performed prior to this latest event. Patient has had a 40 pound weight loss since the beginning of January. Blood pressure elevated 172/116, WBC 16,400, creatinine 1.08, CPK 2341. Troponin negative. CT and imaging revealed hypoattenuation of the left frontoparietal lobe. Patient did not receive TPA. MRI/MRA of the brain showed no acute finding. Chronic microvascular disease and atrophy. There was a 2 mm right supraclinoid ICA aneurysm. EEG negative for seizure. Carotid Dopplers completed showing right internal carotid artery 40-59% stenosis. Echocardiogram with 60% ejection fraction grade 1 diastolic dysfunction without emboli. UGIwith KUB no esophageal stricture or mass. Mild esophageal reflux. Normal duodenum.Marland Kitchen Neurology consulted workup presently ongoing currently maintained on aspirin for CVA prophylaxis. Subcutaneous Lovenox for DVT prophylaxis. Maintain on a regular diet.  Physical andOccupational therapy evaluation completed with recommendations of physical medicine rehabilitation consult.  Patient to be admitted for comprehensive inpatient rehabilitation program.   Total: 3=NIH  Past Medical History      Past Medical History:  Diagnosis Date  . Arthritis   . Cancer [facility])    breast cancer on left - 10  years ago  . Diabetes mellitus without complication ([place])   . GERD (gastroesophageal reflux disease)   .  Hypertension   . Hypothyroidism   . Stroke ([place])    2014  . Wears glasses     Family History  family history is not on file.  Prior Rehab/Hospitalizations: No previous rehab.  Has the patient had major surgery during 100 days prior to admission? No  Current Medications   Current Facility-Administered Medications:  .  acetaminophen (TYLENOL) tablet 650 mg, 650 mg, Oral, Q4H PRN, Rhetta Mura Schorr, NP, 650 mg at 10/09/16 0424 .  ALPRAZolam Duanne Moron) tablet 0.25 mg, 0.25 mg, Oral, TID PRN, Norval Morton, MD, 0.25 mg at 10/09/16 0201 .  aspirin tablet 325 mg, 325 mg, Oral, Daily, Rondell A Tamala Julian, MD, 325 mg at 10/09/16 1106 .  atorvastatin (LIPITOR) tablet 40 mg, 40 mg, Oral, q1800, Norval Morton, MD, 40 mg at 10/08/16 1755 .  enoxaparin (LOVENOX) injection 40 mg, 40 mg, Subcutaneous, Q24H, Rondell A Tamala Julian, MD, 40 mg at 10/08/16 2030 .  felodipine (PLENDIL) 24 hr tablet 5 mg, 5 mg, Oral, Daily, Norval Morton, MD, 5 mg at 10/09/16 1107 .  insulin aspart (novoLOG) injection 0-9 Units, 0-9 Units, Subcutaneous, TID WC, Norval Morton, MD, 1 Units at 10/09/16 1327 .  ipratropium-albuterol (DUONEB) 0.5-2.5 (3) MG/3ML nebulizer solution 3 mL, 3 mL, Nebulization, Q4H PRN, Norval Morton, MD .  levothyroxine (SYNTHROID, LEVOTHROID) tablet 50 mcg, 50 mcg, Oral, QAC breakfast, Norval Morton, MD, 50 mcg at 10/09/16 1106 .  losartan (COZAAR) tablet 100 mg, 100 mg, Oral, Daily, Norval Morton, MD, 100 mg at 10/09/16 1107 .  ondansetron (ZOFRAN) tablet 4 mg, 4 mg, Oral, Q8H PRN, Tomi Bamberger Vann, DO, 4 mg at 10/08/16 1506 .  pantoprazole (PROTONIX) EC tablet 40 mg, 40 mg, Oral, Daily, Norval Morton, MD,  40 mg at 10/09/16 1107 .  senna-docusate (Senokot-S) tablet 1 tablet, 1 tablet, Oral, QHS PRN, Norval Morton, MD  Patients Current Diet: Diet Carb Modified Fluid consistency: Thin; Room service appropriate? Yes Diet - low sodium heart healthy Diet Carb Modified  Precautions /  Restrictions Precautions Precautions: Fall Restrictions Weight Bearing Restrictions: No   Has the patient had 2 or more falls or a fall with injury in the past year?Yes.  Reports at least 2 falls with recent left eye bruising and left hip bruising.  Prior Activity Level Community (5-7x/wk): Went out several times each day.  Was active, was driving.  Home Assistive Devices / Equipment Home Assistive Devices/Equipment: None Home Equipment: Shower seat  Prior Device Use: Indicate devices/aids used by the patient prior to current illness, exacerbation or injury? None  Prior Functional Level Prior Function Level of Independence: Independent Comments: Per sister still drives  Self Care: Did the patient need help bathing, dressing, using the toilet or eating?  Independent  Indoor Mobility: Did the patient need assistance with walking from room to room (with or without device)? Independent  Stairs: Did the patient need assistance with internal or external stairs (with or without device)? Independent  Functional Cognition: Did the patient need help planning regular tasks such as shopping or remembering to take medications? Independent  Current Functional Level Cognition Overall Cognitive Status: Impaired/Different from baseline Current Attention Level: Sustained Orientation Level: Oriented to person, Disoriented to place, Disoriented to time, Disoriented to situation Following Commands: Follows one step commands inconsistently, Follows one step commands with increased time Safety/Judgement: Decreased awareness of safety, Decreased awareness of deficits General Comments: pt difficult to stay on task and often fidgeting with hands grabbing lines and gown.      Extremity Assessment (includes Sensation/Coordination) Upper Extremity Assessment: Defer to OT evaluation RUE Deficits / Details: motor planning difficulty at times. tremor at baseline RUE Coordination: decreased fine  motor  Lower Extremity Assessment: Generalized weakness, Difficult to assess due to impaired cognition   ADLs Overall ADL's : Needs assistance/impaired Eating/Feeding: Minimal assistance, Sitting Grooming: Minimal assistance, Brushing hair, Wash/dry hands, Wash/dry face Upper Body Bathing: Minimal assitance, Sitting Lower Body Bathing: Moderate assistance, Sit to/from stand Upper Body Dressing : Moderate assistance, Sitting Lower Body Dressing: Moderate assistance, Sit to/from stand Toilet Transfer: Moderate assistance, Stand-pivot Toileting- Clothing Manipulation and Hygiene: Moderate assistance Functional mobility during ADLs: Moderate assistance   Mobility Overal bed mobility: Needs Assistance Bed Mobility: Supine to Sit Supine to sit: Min assist General bed mobility comments: simple directions and gestures A with cueing pt to bring herself to sitting at EOB.  pt only needed MinA for balance.     Transfers Overall transfer level: Needs assistance Equipment used: Rolling walker (2 wheeled) Transfers: Sit to/from Stand, [employer] Transfers Sit to Stand: Mod assist Stand pivot transfers: Min assist General transfer comment: cues for UE use and step-by-step through pivot to 3-in-1.     Ambulation / Gait / Stairs / Wheelchair Mobility Ambulation/Gait Ambulation/Gait assistance: Museum/gallery curator (Feet): 50 Feet Assistive device: Rolling walker (2 wheeled) Gait Pattern/deviations: Step-through pattern, Decreased stride length, Shuffle, Drifts right/left, Trunk flexed General Gait Details: pt tends to drift to L side and stands to L side of RW.  pt needs A for management of RW.  pt unaware of deficits during gait despite running into objects on L side.  Multiple cues for positioning within RW and more upright posture.     Posture / Balance Balance  Overall balance assessment: Needs assistance Sitting-balance support: Single extremity supported, Bilateral upper extremity  supported, Feet supported Sitting balance-Leahy Scale: Poor Standing balance support: Bilateral upper extremity supported, Single extremity supported, During functional activity Standing balance-Leahy Scale: Poor   Special needs/care consideration BiPAP/CPAP No CPM No Continuous Drip IV No Dialysis No         Life Vest No  Oxygen No Special Bed No Trach Size No Wound Vac (area) No     Skin Has left eye bruising and left hip bruising with pain left hip                             Bowel mgmt: Last BM sister thinks was 10/07/16, but not documented Bladder mgmt: Voiding up on [facility] with assistance Diabetic mgmt Yes, on oral medication at home and insulin injections here in the hospital    Previous Home Environment Living Arrangements: Alone Available Help at Discharge: Family, Friend(s), Available PRN/intermittently Type of Home: House Home Layout: One level Home Access: Level entry Bathroom Shower/Tub: Multimedia programmer: Standard Bathroom Accessibility: Yes How Accessible: Accessible via walker Home Care Services: No  Discharge Living Setting Plans for Discharge Living Setting: Patient's home, Alone Type of Home at Discharge: House ([place]) Discharge Home Layout: One level Discharge Home Access: Level entry Does the patient have any problems obtaining your medications?: No  Social/Family/Support Systems Patient Roles: Spouse, Other (Comment) (Has a husband, sister and a step son.) Husband currently resides in a memory care unit. Contact Information: [place] - sister - [phone] Anticipated Caregiver: sister, niece and neighbors Ability/Limitations of Caregiver: Sister works but lives [address] mins a [address]. Niece 12 mins away.  Supportive neighbors. Caregiver Availability: Intermittent Discharge Plan Discussed with Primary Caregiver: Yes Is Caregiver In Agreement with Plan?: Yes Does Caregiver/Family have Issues with Lodging/Transportation while Pt is in  Rehab?: No  Goals/Additional Needs Patient/Family Goal for Rehab: PT mod I, OT mod I and supervision, SLP mod I goals Expected length of stay: 8-12 days Cultural Considerations: None Dietary Needs: Carb mod, med cal, thin liquids Equipment Needs: TBD Pt/Family Agrees to Admission and willing to participate: Yes Program Orientation Provided & Reviewed with Pt/Caregiver Including Roles  & Responsibilities: Yes  Decrease burden of Care through IP rehab admission: N/A  Possible need for SNF placement upon discharge: Not planned  Patient Condition: This patient's condition remains as documented in the consult dated 10/09/16, in which the Rehabilitation Physician determined and documented that the patient's condition is appropriate for intensive rehabilitative care in an inpatient rehabilitation facility. Will admit to inpatient rehab today.   Preadmission Screen Completed By:  Retta Diones, 10/09/2016 2:18 PM ______________________________________________________________________   Discussed status with Dr. Naaman Plummer on 10/09/16 at 1419 and received telephone approval for admission today.  Admission Coordinator:  Retta Diones, time1419/Date11/15/17       Cosigned by: Ankit Lorie Phenix, MD at 10/09/2016 2:25 PM  Revision History

## 2016-10-09 NOTE — Progress Notes (Signed)
Pt being transferred to inpatient rehab per orders from MD. Pt and family are aware of transfer. All questions and concerns were addressed prior to transfer. Pt transferred via wheelchair.

## 2016-10-09 NOTE — PMR Pre-admission (Signed)
PMR Admission Coordinator Pre-Admission Assessment  Patient: [name] is an 82 y.o., female MRN: [id number] DOB: 1934/08/02 Height: 5\' 4"  (162.6 cm) Weight: 65.8 kg (145 lb)              Insurance Information HMO:No   PPO:       PCP:       IPA:       80/20:       OTHER:   PRIMARY:  Medicare A/B      Policy#: [id number]      Subscriber: Delton See CM Name:        Phone#:       Fax#:   Pre-Cert#:        Employer: REtired Benefits:  Phone #:       Name: Checked in [place]. Date: 07/26/01     Deduct:  $1316      Out of Pocket Max: none      Life Max: unlimited CIR: 100%      SNF: 100 days Outpatient: 80%     Co-Pay: 20% Home Health: 100%      Co-Pay: none DME: 80%     Co-Pay: 20% Providers: patient's choice  SECONDARY:  [employer]      Policy#: [id number]      Subscriber:  Delton See CM Name:        Phone#:       Fax#:   Pre-Cert#:        Employer:  Retired Benefits:  Phone #:  [phone]     Name:   Eff. Date:       Deduct:        Out of Pocket Max:        Life Max:   CIR:        SNF:   Outpatient:       Co-Pay:   Home Health:        Co-Pay:   DME:       Co-Pay:    Emergency Contact Information Contact Information    Name Relation Home Work Mobile   [place] Sister [phone]  [place]    [phone]     Current Medical History  Patient Admitting Diagnosis:  Debility, Rhabdo post fall  History of Present Illness: An 82 y.o.right handed femalewith history of diabetes mellitus, hypertension, breast cancer, CVA with residual tremor right side. Per chart review patient lives alone and was independent with ADLs and mobility prior to admission with progressive decline over the past 6 weeks. Her husband has dementia and is currently in a skilled nursing facility. Presented 10/06/2016 after being found face down on the kitchen floor of her home by her sister with altered mental status as well as bouts of nausea and vomiting. Reports of progressive  weakness over the past 6 weeks. Recent imaging of abdomen by PCP unremarkable. Patient was scheduled to have an upper GI series performed prior to this latest event. Patient has had a 40 pound weight loss since the beginning of January. Blood pressure elevated 172/116, WBC 16,400, creatinine 1.08, CPK 2341. Troponin negative. CT and imaging revealed hypoattenuation of the left frontoparietal lobe. Patient did not receive TPA. MRI/MRA of the brain showed no acute finding. Chronic microvascular disease and atrophy. There was a 2 mm right supraclinoid ICA aneurysm. EEG negative for seizure. Carotid Dopplers completed showing right internal carotid artery 40-59% stenosis. Echocardiogram with 60% ejection fraction grade 1 diastolic dysfunction without emboli. UGIwith KUB no esophageal stricture  or mass. Mild esophageal reflux. Normal duodenum.Marland Kitchen Neurology consulted workup presently ongoing currently maintained on aspirin for CVA prophylaxis. Subcutaneous Lovenox for DVT prophylaxis. Maintain on a regular diet.  Physical and Occupational therapy evaluation completed with recommendations of physical medicine rehabilitation consult.  Patient to be admitted for comprehensive inpatient rehabilitation program.   Total: 3=NIH  Past Medical History  Past Medical History:  Diagnosis Date  . Arthritis   . Cancer [facility])    breast cancer on left - 10  years ago  . Diabetes mellitus without complication ([place])   . GERD (gastroesophageal reflux disease)   . Hypertension   . Hypothyroidism   . Stroke ([place])    2014  . Wears glasses     Family History  family history is not on file.  Prior Rehab/Hospitalizations: No previous rehab.  Has the patient had major surgery during 100 days prior to admission? No  Current Medications   Current Facility-Administered Medications:  .  acetaminophen (TYLENOL) tablet 650 mg, 650 mg, Oral, Q4H PRN, Rhetta Mura Schorr, NP, 650 mg at 10/09/16 0424 .  ALPRAZolam Duanne Moron) tablet  0.25 mg, 0.25 mg, Oral, TID PRN, Norval Morton, MD, 0.25 mg at 10/09/16 0201 .  aspirin tablet 325 mg, 325 mg, Oral, Daily, Rondell A Tamala Julian, MD, 325 mg at 10/09/16 1106 .  atorvastatin (LIPITOR) tablet 40 mg, 40 mg, Oral, q1800, Norval Morton, MD, 40 mg at 10/08/16 1755 .  enoxaparin (LOVENOX) injection 40 mg, 40 mg, Subcutaneous, Q24H, Rondell A Tamala Julian, MD, 40 mg at 10/08/16 2030 .  felodipine (PLENDIL) 24 hr tablet 5 mg, 5 mg, Oral, Daily, Norval Morton, MD, 5 mg at 10/09/16 1107 .  insulin aspart (novoLOG) injection 0-9 Units, 0-9 Units, Subcutaneous, TID WC, Norval Morton, MD, 1 Units at 10/09/16 1327 .  ipratropium-albuterol (DUONEB) 0.5-2.5 (3) MG/3ML nebulizer solution 3 mL, 3 mL, Nebulization, Q4H PRN, Norval Morton, MD .  levothyroxine (SYNTHROID, LEVOTHROID) tablet 50 mcg, 50 mcg, Oral, QAC breakfast, Norval Morton, MD, 50 mcg at 10/09/16 1106 .  losartan (COZAAR) tablet 100 mg, 100 mg, Oral, Daily, Norval Morton, MD, 100 mg at 10/09/16 1107 .  ondansetron (ZOFRAN) tablet 4 mg, 4 mg, Oral, Q8H PRN, Tomi Bamberger Vann, DO, 4 mg at 10/08/16 1506 .  pantoprazole (PROTONIX) EC tablet 40 mg, 40 mg, Oral, Daily, Norval Morton, MD, 40 mg at 10/09/16 1107 .  senna-docusate (Senokot-S) tablet 1 tablet, 1 tablet, Oral, QHS PRN, Norval Morton, MD  Patients Current Diet: Diet Carb Modified Fluid consistency: Thin; Room service appropriate? Yes Diet - low sodium heart healthy Diet Carb Modified  Precautions / Restrictions Precautions Precautions: Fall Restrictions Weight Bearing Restrictions: No   Has the patient had 2 or more falls or a fall with injury in the past year?Yes.  Reports at least 2 falls with recent left eye bruising and left hip bruising.  Prior Activity Level Community (5-7x/wk): Went out several times each day.  Was active, was driving.  Home Assistive Devices / Equipment Home Assistive Devices/Equipment: None Home Equipment: Shower seat  Prior Device Use:  Indicate devices/aids used by the patient prior to current illness, exacerbation or injury? None  Prior Functional Level Prior Function Level of Independence: Independent Comments: Per sister still drives  Self Care: Did the patient need help bathing, dressing, using the toilet or eating?  Independent  Indoor Mobility: Did the patient need assistance with walking from room to room (with or without device)?  Independent  Stairs: Did the patient need assistance with internal or external stairs (with or without device)? Independent  Functional Cognition: Did the patient need help planning regular tasks such as shopping or remembering to take medications? Independent  Current Functional Level Cognition  Overall Cognitive Status: Impaired/Different from baseline Current Attention Level: Sustained Orientation Level: Oriented to person, Disoriented to place, Disoriented to time, Disoriented to situation Following Commands: Follows one step commands inconsistently, Follows one step commands with increased time Safety/Judgement: Decreased awareness of safety, Decreased awareness of deficits General Comments: pt difficult to stay on task and often fidgeting with hands grabbing lines and gown.      Extremity Assessment (includes Sensation/Coordination)  Upper Extremity Assessment: Defer to OT evaluation RUE Deficits / Details: motor planning difficulty at times. tremor at baseline RUE Coordination: decreased fine motor  Lower Extremity Assessment: Generalized weakness, Difficult to assess due to impaired cognition    ADLs  Overall ADL's : Needs assistance/impaired Eating/Feeding: Minimal assistance, Sitting Grooming: Minimal assistance, Brushing hair, Wash/dry hands, Wash/dry face Upper Body Bathing: Minimal assitance, Sitting Lower Body Bathing: Moderate assistance, Sit to/from stand Upper Body Dressing : Moderate assistance, Sitting Lower Body Dressing: Moderate assistance, Sit to/from  stand Toilet Transfer: Moderate assistance, Stand-pivot Toileting- Clothing Manipulation and Hygiene: Moderate assistance Functional mobility during ADLs: Moderate assistance    Mobility  Overal bed mobility: Needs Assistance Bed Mobility: Supine to Sit Supine to sit: Min assist General bed mobility comments: simple directions and gestures A with cueing pt to bring herself to sitting at EOB.  pt only needed MinA for balance.      Transfers  Overall transfer level: Needs assistance Equipment used: Rolling walker (2 wheeled) Transfers: Sit to/from Stand, [employer] Transfers Sit to Stand: Mod assist Stand pivot transfers: Min assist General transfer comment: cues for UE use and step-by-step through pivot to 3-in-1.      Ambulation / Gait / Stairs / Wheelchair Mobility  Ambulation/Gait Ambulation/Gait assistance: Museum/gallery curator (Feet): 50 Feet Assistive device: Rolling walker (2 wheeled) Gait Pattern/deviations: Step-through pattern, Decreased stride length, Shuffle, Drifts right/left, Trunk flexed General Gait Details: pt tends to drift to L side and stands to L side of RW.  pt needs A for management of RW.  pt unaware of deficits during gait despite running into objects on L side.  Multiple cues for positioning within RW and more upright posture.      Posture / Balance Balance Overall balance assessment: Needs assistance Sitting-balance support: Single extremity supported, Bilateral upper extremity supported, Feet supported Sitting balance-Leahy Scale: Poor Standing balance support: Bilateral upper extremity supported, Single extremity supported, During functional activity Standing balance-Leahy Scale: Poor    Special needs/care consideration BiPAP/CPAP No CPM No Continuous Drip IV No Dialysis No         Life Vest No  Oxygen No Special Bed No Trach Size No Wound Vac (area) No     Skin Has left eye bruising and left hip bruising with pain left hip                              Bowel mgmt: Last BM sister thinks was 10/07/16, but not documented Bladder mgmt: Voiding up on [facility] with assistance Diabetic mgmt Yes, on oral medication at home and insulin injections here in the hospital     Previous Home Environment Living Arrangements: Alone Available Help at Discharge: Family, Friend(s), Available PRN/intermittently Type of Home: House  Home Layout: One level Home Access: Level entry Bathroom Shower/Tub: Multimedia programmer: Standard Bathroom Accessibility: Yes How Accessible: Accessible via walker [place]: No  Discharge Living Setting Plans for Discharge Living Setting: Patient's home, Alone Type of Home at Discharge: House ([place]) Discharge Home Layout: One level Discharge Home Access: Level entry Does the patient have any problems obtaining your medications?: No  Social/Family/Support Systems Patient Roles: Spouse, Other (Comment) (Has a husband, sister and a step son.) Husband currently resides in a memory care unit. Contact Information: [place] - sister - [phone] Anticipated Caregiver: sister, niece and neighbors Ability/Limitations of Caregiver: Sister works but lives [address] mins a [address]. Niece 12 mins away.  Supportive neighbors. Caregiver Availability: Intermittent Discharge Plan Discussed with Primary Caregiver: Yes Is Caregiver In Agreement with Plan?: Yes Does Caregiver/Family have Issues with Lodging/Transportation while Pt is in Rehab?: No  Goals/Additional Needs Patient/Family Goal for Rehab: PT mod I, OT mod I and supervision, SLP mod I goals Expected length of stay: 8-12 days Cultural Considerations: None Dietary Needs: Carb mod, med cal, thin liquids Equipment Needs: TBD Pt/Family Agrees to Admission and willing to participate: Yes Program Orientation Provided & Reviewed with Pt/Caregiver Including Roles  & Responsibilities: Yes  Decrease burden of Care through IP rehab admission:  N/A  Possible need for SNF placement upon discharge: Not planned  Patient Condition: This patient's condition remains as documented in the consult dated 10/09/16, in which the Rehabilitation Physician determined and documented that the patient's condition is appropriate for intensive rehabilitative care in an inpatient rehabilitation facility. Will admit to inpatient rehab today.   Preadmission Screen Completed By:  Retta Diones, 10/09/2016 2:18 PM ______________________________________________________________________   Discussed status with Dr. Naaman Plummer on 10/09/16 at 1419 and received telephone approval for admission today.  Admission Coordinator:  Retta Diones, time1419/Date11/15/17

## 2016-10-09 NOTE — Progress Notes (Signed)
Late entry. Patient received at approximately 1730 alert and oriented x3-4. Patient noted to be very anxious and "jumpy" to loud noises. Oriented patient and niece,Donna to room and call bell system. Patient and family verbalized understanding of admission process. Patient required RN to repeat process more than one time. Continue with plan of care.  [name]

## 2016-10-09 NOTE — Care Management Note (Signed)
Case Management Note  Patient Details  Name: [name] MRN: [id number] Date of Birth: [birth year]  Subjective/Objective:                    Action/Plan: Pt discharging to CIR. No further needs per CM.   Expected Discharge Date:                  Expected Discharge Plan:  [place]  In-House Referral:     Discharge planning Services     Post Acute Care Choice:    Choice offered to:     DME Arranged:    DME Agency:     HH Arranged:    [place] Agency:     Status of Service:  Completed, signed off  If discussed at [employer] of Stay Meetings, dates discussed:    Additional Comments:  Pollie Friar, RN 10/09/2016, 12:56 PM

## 2016-10-09 NOTE — Progress Notes (Signed)
Occupational Therapy Treatment Patient Details Name: [name] MRN: [id number] DOB: Apr 25, 1934 Today's Date: 10/09/2016    History of present illness 82 y.o. female with medical history significant of HTN, hypothyroidism, CVA w/ possible residual tremor right side, and DM type II; who was found facedown on the kitchen floor of her home CT head revealed three subtle areas of hypoattenuation in the subcortical left frontal lobe, possibly representing subacute ischemic infarctions.MRI did not detect acute infarct or hemorrhage   OT comments  Pt making progress with functional goals. OT will continue to follow acutely  Follow Up Recommendations  Supervision/Assistance - 24 hour;CIR    Equipment Recommendations  3 in 1 bedside comode    Recommendations for Other Services      Precautions / Restrictions Precautions Precautions: Fall Restrictions Weight Bearing Restrictions: No       Mobility Bed Mobility               General bed mobility comments: pt up in recliner  Transfers Overall transfer level: Needs assistance Equipment used: Rolling walker (2 wheeled);None Transfers: Sit to/from [facility] Sit to Stand: Min assist;Mod assist Stand pivot transfers: Min assist            Balance Overall balance assessment: Needs assistance   Sitting balance-Leahy Scale: Fair       Standing balance-Leahy Scale: Poor                     ADL Overall ADL's : Needs assistance/impaired     Grooming: Wash/dry hands;Wash/dry face;Min guard           Upper Body Dressing : Min guard;Standing   Lower Body Dressing: Minimal assistance;Sit to/from stand   Toilet Transfer: Minimal assistance;Regular Toilet;Grab bars;Ambulation;Cueing for safety;Cueing for sequencing   Toileting- Clothing Manipulation and Hygiene: Minimal assistance;Sit to/from stand       Functional mobility during ADLs: Minimal assistance                   Cognition   Behavior During Therapy: Impulsive Overall Cognitive Status: Impaired/Different from baseline Area of Impairment: Orientation;Attention;Memory;Following commands;Safety/judgement;Awareness;Problem solving     Memory: Decreased recall of precautions;Decreased short-term memory  Following Commands: Follows one step commands inconsistently;Follows one step commands with increased time Safety/Judgement: Decreased awareness of safety;Decreased awareness of deficits   Problem Solving: Slow processing;Decreased initiation;Difficulty sequencing;Requires verbal cues;Requires tactile cues      Extremity/Trunk Assessment   generalized weakness                        General Comments  pt very pleasant and cooperative, sister very supportive    Pertinent Vitals/ Pain       Pain Assessment: No/denies pain  Home Living  home alone                                                      Frequency  Min 2X/week        Progress Toward Goals  OT Goals(current goals can now be found in the care plan section)  Progress towards OT goals: Progressing toward goals     Plan Discharge plan remains appropriate                     End of Session Equipment  Utilized During Treatment: Gait belt;Rolling walker   Activity Tolerance Patient tolerated treatment well   Patient Left with call bell/phone within reach;with family/visitor present;in chair;with chair alarm set   \          Time: [id number] OT Time Calculation (min): 24 min  Charges: OT General Charges $OT Visit: 1 Procedure OT Treatments $Self Care/Home Management : 8-22 mins $Therapeutic Activity: 8-22 mins  Britt Bottom 10/09/2016, 2:50 PM

## 2016-10-09 NOTE — Progress Notes (Signed)
Pt does not have family member in room with her tonight and she has become very confused, and very difficult to redirect with little or no short term memory constantly trying to get out of bed. Anxiety is obvious gave her .25 mg xanax will continue to monitor.

## 2016-10-09 NOTE — Progress Notes (Signed)
Emily Staggers, MD Physician Signed Physical Medicine and Rehabilitation  Consult Note Date of Service: 10/08/2016 5:57 AM  Related encounter: ED to Hosp-Admission (Current) from 10/06/2016 in [facility] Collapse All   [] Hide copied text [] Hover for attribution information      Physical Medicine and Rehabilitation Consult Reason for Consult: Syncope with history of CVA Referring Physician: [facility]   HPI: [name] is a 82 y.o. right handed female with history of diabetes mellitus, hypertension, breast cancer, CVA with residual tremor right side. Per chart review patient lives alone and was independent with ADLs and mobility prior to admission with progressive decline over the past 6 weeks. Her husband has dementia and is currently in a skilled nursing facility. Presented 10/06/2016 after being found face down on the kitchen floor of her home by her sister with altered mental status as well as bouts of nausea and vomiting. Reports of progressive weakness over the past 6 weeks. Recent imaging of abdomen by PCP unremarkable. Patient was scheduled to have an upper GI series performed prior to this latest event. Patient has had a 40 pound weight loss since the beginning of January. Blood pressure elevated 172/116, WBC 16,400, creatinine 1.08, CPK 2341. Troponin negative. CT and imaging revealed hypoattenuation of the left frontoparietal  lobe. Patient did not receive TPA. MRI/MRA of the brain showed no acute finding. Chronic microvascular disease and atrophy. There was a 2 mm right supraclinoid ICA aneurysm. EEG negative for seizure. Carotid Dopplers completed showing right internal carotid artery 40-59% stenosis. Echocardiogram with 60% ejection fraction grade 1 diastolic dysfunction without emboli. UGI with KUB no esophageal stricture or mass. Mild esophageal reflux. Normal duodenum.Marland Kitchen Neurology consulted workup presently ongoing  currently maintained on aspirin for CVA prophylaxis. Subcutaneous Lovenox for DVT prophylaxis. Maintain on a regular diet. Occupational therapy evaluation completed with recommendations of physical medicine rehabilitation consult.   Review of Systems  Constitutional: Positive for weight loss.  HENT: Negative for hearing loss and tinnitus.   Eyes: Negative for blurred vision and double vision.  Respiratory: Negative for cough and shortness of breath.   Cardiovascular: Negative for chest pain, palpitations and leg swelling.  Gastrointestinal: Positive for nausea and vomiting.  Genitourinary: Negative for dysuria and hematuria.  Musculoskeletal: Positive for back pain and falls.  Skin: Negative for rash.  Neurological: Positive for tremors and weakness.  Psychiatric/Behavioral:       Anxiety  All other systems reviewed and are negative.      Past Medical History:  Diagnosis Date  . Arthritis   . Cancer [facility])    breast cancer on left - 10  years ago  . Diabetes mellitus without complication ([place])   . GERD (gastroesophageal reflux disease)   . Hypertension   . Hypothyroidism   . Stroke ([place])    2014  . Wears glasses         Past Surgical History:  Procedure Laterality Date  . ABDOMINAL HYSTERECTOMY    . APPENDECTOMY    . ARCUATE KERATECTOMY    . BUNIONECTOMY     right  . CARPAL TUNNEL RELEASE     right  . CARPAL TUNNEL RELEASE Left 05/24/2014   Procedure: LEFT CARPAL TUNNEL RELEASE LEFT THUMB TRIGGER FINGER RELEASE;  Surgeon: Cammie Sickle, MD;  Location: [place];  Service: Orthopedics;  Laterality: Left;  left thumb also site of incision  . DEBRIDEMENT OF ABDOMINAL WALL  ABSCESS  1978  . OVARIAN CYST SURGERY     x2-  . TONSILLECTOMY    . TRIGGER FINGER RELEASE Left 01/25/2016   Procedure: RELEASE A-1 PULLEY LEFT RING FINGER, LEFT SMALL FINGER;  Surgeon: Daryll Brod, MD;  Location: [place];  Service:  Orthopedics;  Laterality: Left;  ANESTHESIA: IV REGIONAL FAB   History reviewed. No pertinent family history. Social History:  reports that she has quit smoking. Her smoking use included Cigarettes. She has never used smokeless tobacco. She reports that she drinks alcohol. She reports that she does not use drugs. Allergies:       Allergies  Allergen Reactions  . Tramadol Anaphylaxis  . Hydrocodone-Acetaminophen Other (See Comments)  . Morphine And Related Nausea And Vomiting  . Penicillins Swelling  . Sitagliptin Other (See Comments)  . Tetracyclines & Related Swelling    Face and eyes swells         Medications Prior to Admission  Medication Sig Dispense Refill  . ALPRAZolam (XANAX) 0.25 MG tablet Take 0.25 mg by mouth 3 (three) times daily.    . felodipine (PLENDIL) 5 MG 24 hr tablet Take 5 mg by mouth daily.    Marland Kitchen levothyroxine (SYNTHROID, LEVOTHROID) 50 MCG tablet Take 50 mcg by mouth daily before breakfast.    . losartan (COZAAR) 100 MG tablet Take 100 mg by mouth daily.    . metFORMIN (GLUCOPHAGE-XR) 750 MG 24 hr tablet Take 750 mg by mouth daily with breakfast.    . ondansetron (ZOFRAN) 8 MG tablet Take 8 mg by mouth every 8 (eight) hours as needed for nausea.    . promethazine (PHENERGAN) 25 MG tablet Take 25 mg by mouth every 6 (six) hours as needed for nausea or vomiting.    . Ascorbic Acid (VITAMIN C ER PO) Take by mouth daily.    . Coenzyme Q10 (CO Q-10) 100 MG CAPS Take by mouth.    . Fish Oil OIL by Does not apply route daily.    . Multiple Vitamins-Minerals (MULTIVITAMIN WITH MINERALS) tablet Take 1 tablet by mouth daily.    Marland Kitchen omeprazole (PRILOSEC) 20 MG capsule Take 20 mg by mouth daily.    . Red Yeast Rice Extract (RED YEAST RICE PO) Take by mouth daily.    Marland Kitchen senna-docusate (SENOKOT-S) 8.6-50 MG per tablet Take 1 tablet by mouth 2 (two) times daily. (Patient not taking: Reported on 10/06/2016) 30 tablet 0  . simvastatin (ZOCOR) 20 MG  tablet Take 20 mg by mouth daily.    . traMADol (ULTRAM) 50 MG tablet Take 1 tablet (50 mg total) by mouth every 6 (six) hours as needed. 20 tablet 0  . UNABLE TO FIND Magnilife pain relieving foot cream,as needed    . vitamin B-12 (CYANOCOBALAMIN) 250 MCG tablet Take 250 mcg by mouth daily.      Home: Home Living Family/patient expects to be discharged to:: Private residence Living Arrangements: Alone Available Help at Discharge: Family, Friend(s), Available PRN/intermittently Type of Home: House Home Access: Level entry [place]: One level Bathroom Shower/Tub: Multimedia programmer: Standard Bathroom Accessibility: Yes Home Equipment: Careers adviser History: Prior Function Level of Independence: Independent Comments: Per sister still drives Functional Status:  Mobility: Bed Mobility Overal bed mobility: Needs Assistance Bed Mobility: Supine to Sit Supine to sit: Mod assist General bed mobility comments: difficulty with problem solving on how to get to EOB Transfers Overall transfer level: Needs assistance Transfers: Sit to/from Stand, [employer]  Sit to Stand: Mod assist Stand pivot transfers: Mod assist General transfer comment: Poor awareness of midline. posterior bias      ADL: ADL Overall ADL's : Needs assistance/impaired Eating/Feeding: Minimal assistance, Sitting Grooming: Minimal assistance, Brushing hair, Wash/dry hands, Wash/dry face Upper Body Bathing: Minimal assitance, Sitting Lower Body Bathing: Moderate assistance, Sit to/from stand Upper Body Dressing : Moderate assistance, Sitting Lower Body Dressing: Moderate assistance, Sit to/from stand Toilet Transfer: Moderate assistance, Stand-pivot Toileting- Clothing Manipulation and Hygiene: Moderate assistance Functional mobility during ADLs: Moderate assistance  Cognition: Cognition Overall Cognitive Status: Impaired/Different from baseline Orientation Level:  Oriented to person, Oriented to place, Disoriented to time, Oriented to situation Cognition Arousal/Alertness: Awake/alert Behavior During Therapy: Restless, Anxious Overall Cognitive Status: Impaired/Different from baseline Area of Impairment: Orientation, Attention, Memory, Following commands, Safety/judgement, Problem solving, Awareness Orientation Level: Disoriented to, Place, Time Current Attention Level: Sustained Memory: Decreased recall of precautions, Decreased short-term memory Following Commands: Follows one step commands inconsistently Safety/Judgement: Decreased awareness of safety, Decreased awareness of deficits Awareness: Intellectual Problem Solving: Slow processing, Difficulty sequencing, Requires verbal cues  Blood pressure (!) 156/75, pulse 76, temperature 98.2 F (36.8 C), temperature source Oral, resp. rate 18, height 5\' 4"  (1.626 m), weight 65.8 kg (145 lb), SpO2 96 %. Physical Exam  Vitals reviewed. HENT:  Multiple bruises to the face and orbital area  Eyes: EOM are normal.  Neck: Normal range of motion. Neck supple. No thyromegaly present.  Cardiovascular: Normal rate and regular rhythm.   Respiratory: Effort normal and breath sounds normal. No respiratory distress.  GI: Soft. Bowel sounds are normal. She exhibits no distension.  Neurological: She is alert.  Mood is flat but appropriate. She provides her name and age. She cannot recall the name of the skilled nursing facility that her husband is currently a resident of. Followed simple commands  Skin: Skin is warm and dry.    Lab Results Last 24 Hours       Results for orders placed or performed during the hospital encounter of 10/06/16 (from the past 24 hour(s))  Glucose, capillary     Status: None   Collection Time: 10/07/16  6:44 AM  Result Value Ref Range   Glucose-Capillary 94 65 - 99 mg/dL   Comment 1 Notify RN    Comment 2 Document in Chart   CBC     Status: Abnormal   Collection Time:  10/07/16  7:06 AM  Result Value Ref Range   WBC 9.5 4.0 - 10.5 K/uL   RBC 3.74 (L) 3.87 - 5.11 MIL/uL   Hemoglobin 12.2 12.0 - 15.0 g/dL   HCT 36.2 36.0 - 46.0 %   MCV 96.8 78.0 - 100.0 fL   MCH 32.6 26.0 - 34.0 pg   MCHC 33.7 30.0 - 36.0 g/dL   RDW 12.6 11.5 - 15.5 %   Platelets 223 150 - 400 K/uL  Basic metabolic panel     Status: Abnormal   Collection Time: 10/07/16  7:06 AM  Result Value Ref Range   Sodium 140 135 - 145 mmol/L   Potassium 3.8 3.5 - 5.1 mmol/L   Chloride 106 101 - 111 mmol/L   CO2 25 22 - 32 mmol/L   Glucose, Bld 95 65 - 99 mg/dL   BUN 17 6 - 20 mg/dL   Creatinine, Ser 1.06 (H) 0.44 - 1.00 mg/dL   Calcium 9.0 8.9 - 10.3 mg/dL   GFR calc non Af Amer 48 (L) >60 mL/min   GFR calc Af  Amer 56 (L) >60 mL/min   Anion gap 9 5 - 15  CK     Status: Abnormal   Collection Time: 10/07/16  7:06 AM  Result Value Ref Range   Total CK 1,584 (H) 38 - 234 U/L  TSH     Status: None   Collection Time: 10/07/16  7:06 AM  Result Value Ref Range   TSH 1.510 0.350 - 4.500 uIU/mL  Lipid panel     Status: None   Collection Time: 10/07/16  7:06 AM  Result Value Ref Range   Cholesterol 154 0 - 200 mg/dL   Triglycerides 108 <150 mg/dL   HDL 52 >40 mg/dL   Total CHOL/HDL Ratio 3.0 RATIO   VLDL 22 0 - 40 mg/dL   LDL Cholesterol 80 0 - 99 mg/dL  Glucose, capillary     Status: None   Collection Time: 10/07/16 12:20 PM  Result Value Ref Range   Glucose-Capillary 90 65 - 99 mg/dL   Comment 1 Notify RN    Comment 2 Document in Chart   Glucose, capillary     Status: Abnormal   Collection Time: 10/07/16  5:52 PM  Result Value Ref Range   Glucose-Capillary 133 (H) 65 - 99 mg/dL  Glucose, capillary     Status: Abnormal   Collection Time: 10/07/16  9:44 PM  Result Value Ref Range   Glucose-Capillary 112 (H) 65 - 99 mg/dL   Comment 1 Notify RN    Comment 2 Document in Chart       Imaging Results (Last 48 hours)  Ct Head Wo  Contrast  Result Date: 10/06/2016 CLINICAL DATA:  Altered mental status. Patient is status post fall with trauma to the left side of her head. EXAM: CT HEAD WITHOUT CONTRAST CT CERVICAL SPINE WITHOUT CONTRAST TECHNIQUE: Multidetector CT imaging of the head and cervical spine was performed following the standard protocol without intravenous contrast. Multiplanar CT image reconstructions of the cervical spine were also generated. COMPARISON:  None. FINDINGS: CT HEAD FINDINGS Brain: No evidence of acute hemorrhage, hydrocephalus, extra-axial collection or mass lesion/mass effect. Subtle area of hypoattenuation in the subcortical left frontoparietal lobe may represent an acute ischemic infarction. Moderate brain parenchymal volume loss and microangiopathy noted. Vascular: Atherosclerotic calcifications at the skullbase. Skull: Normal. Negative for fracture or focal lesion. Sinuses/Orbits: No acute finding. Other: None. CT CERVICAL SPINE FINDINGS The study is degraded by motion artifact. Alignment: Reversal of physiologic lordosis likely secondary to extensive multifocal osteoarthritic changes of the cervical spine. Skull base and vertebrae: No evidence of fracture. Soft tissues and spinal canal: Ligamentous thickening posterior to the dens, likely degenerative. Disc levels: Moderate to severe osteoarthritic changes with disc space narrowing, remodeling of vertebral bodies, osteophyte formation. Similarly posterior facet arthropathy at all levels. Upper chest: Negative. Other: None. IMPRESSION: Area of hypoattenuation in the subcortical left frontoparietal lobe may represent acute ischemic infarction. Moderate brain parenchymal volume loss and microangiopathy. No evidence of acute traumatic injury to the cervical spine. Extensive spondylosis of the cervical spine with reversal of physiologic lordosis. Please note that the cervical spine CT is degraded by motion artifact due to patient's inability to cooperate. These  results were called by telephone at the time of interpretation on 10/06/2016 at 6:25 pm to Dr. Shirlyn Goltz , who verbally acknowledged these results. Electronically Signed   By: Fidela Salisbury M.D.   On: 10/06/2016 18:28   Ct Cervical Spine Wo Contrast  Result Date: 10/06/2016 CLINICAL DATA:  Altered mental status.  Patient is status post fall with trauma to the left side of her head. EXAM: CT HEAD WITHOUT CONTRAST CT CERVICAL SPINE WITHOUT CONTRAST TECHNIQUE: Multidetector CT imaging of the head and cervical spine was performed following the standard protocol without intravenous contrast. Multiplanar CT image reconstructions of the cervical spine were also generated. COMPARISON:  None. FINDINGS: CT HEAD FINDINGS Brain: No evidence of acute hemorrhage, hydrocephalus, extra-axial collection or mass lesion/mass effect. Subtle area of hypoattenuation in the subcortical left frontoparietal lobe may represent an acute ischemic infarction. Moderate brain parenchymal volume loss and microangiopathy noted. Vascular: Atherosclerotic calcifications at the skullbase. Skull: Normal. Negative for fracture or focal lesion. Sinuses/Orbits: No acute finding. Other: None. CT CERVICAL SPINE FINDINGS The study is degraded by motion artifact. Alignment: Reversal of physiologic lordosis likely secondary to extensive multifocal osteoarthritic changes of the cervical spine. Skull base and vertebrae: No evidence of fracture. Soft tissues and spinal canal: Ligamentous thickening posterior to the dens, likely degenerative. Disc levels: Moderate to severe osteoarthritic changes with disc space narrowing, remodeling of vertebral bodies, osteophyte formation. Similarly posterior facet arthropathy at all levels. Upper chest: Negative. Other: None. IMPRESSION: Area of hypoattenuation in the subcortical left frontoparietal lobe may represent acute ischemic infarction. Moderate brain parenchymal volume loss and microangiopathy. No evidence  of acute traumatic injury to the cervical spine. Extensive spondylosis of the cervical spine with reversal of physiologic lordosis. Please note that the cervical spine CT is degraded by motion artifact due to patient's inability to cooperate. These results were called by telephone at the time of interpretation on 10/06/2016 at 6:25 pm to Dr. Shirlyn Goltz , who verbally acknowledged these results. Electronically Signed   By: Fidela Salisbury M.D.   On: 10/06/2016 18:28   Mr Brain Wo Contrast  Result Date: 10/07/2016 CLINICAL DATA:  Found on floor.  Possible stroke. EXAM: MRI HEAD WITHOUT CONTRAST MRA HEAD WITHOUT CONTRAST TECHNIQUE: Multiplanar, multiecho pulse sequences of the brain and surrounding structures were obtained without intravenous contrast. Angiographic images of the head were obtained using MRA technique without contrast. COMPARISON:  Head CT from yesterday. FINDINGS: MRI HEAD FINDINGS Brain: No acute infarction, hemorrhage, hydrocephalus, extra-axial collection or mass lesion. Ventriculomegaly that is likely central predominant atrophy. Mesial temporal volume loss is mild-to-moderate. No third ventricular ballooning. Small-vessel ischemic changes in cerebral white matter, with a thin confluent band around the lateral ventricles. Vascular: Normal flow voids. Skull and upper cervical spine: Hypo intense appearance of the dens on T1 weighted imaging is from sclerosis. No focal marrow lesion. Sinuses/Orbits: Cataract resection. Other: Motion degraded study, best obtainable in a setting. MRA HEAD FINDINGS Tortuous right cervical ICA. Symmetric carotid arteries and branching. No major branch occlusion or flow limiting stenosis. No unusual vertebrobasilar branching. No flow limiting stenosis or major branch occlusion. 2 mm outpouching directed inferiorly from the right supraclinoid ICA, with no visible neighboring vessel. IMPRESSION: 1. No acute finding, including infarct. 2. Chronic microvascular  disease and atrophy. 3. No acute finding on intracranial MRA.  No flow limiting stenosis. 4. 2 mm right supraclinoid ICA aneurysm or infundibulum. Electronically Signed   By: Monte Fantasia M.D.   On: 10/07/2016 06:44   Dg Chest Port 1 View  Result Date: 10/06/2016 CLINICAL DATA:  Altered mental status EXAM: PORTABLE CHEST 1 VIEW COMPARISON:  03/21/2014 FINDINGS: The heart size and mediastinal contours are within normal limits. Both lungs are clear. The visualized skeletal structures are unremarkable. IMPRESSION: No active disease. Electronically Signed   By: Kathreen Devoid  On: 10/06/2016 16:15   Mr Jodene Nam Head/brain [id number] Cm  Result Date: 10/07/2016 CLINICAL DATA:  Found on floor.  Possible stroke. EXAM: MRI HEAD WITHOUT CONTRAST MRA HEAD WITHOUT CONTRAST TECHNIQUE: Multiplanar, multiecho pulse sequences of the brain and surrounding structures were obtained without intravenous contrast. Angiographic images of the head were obtained using MRA technique without contrast. COMPARISON:  Head CT from yesterday. FINDINGS: MRI HEAD FINDINGS Brain: No acute infarction, hemorrhage, hydrocephalus, extra-axial collection or mass lesion. Ventriculomegaly that is likely central predominant atrophy. Mesial temporal volume loss is mild-to-moderate. No third ventricular ballooning. Small-vessel ischemic changes in cerebral white matter, with a thin confluent band around the lateral ventricles. Vascular: Normal flow voids. Skull and upper cervical spine: Hypo intense appearance of the dens on T1 weighted imaging is from sclerosis. No focal marrow lesion. Sinuses/Orbits: Cataract resection. Other: Motion degraded study, best obtainable in a setting. MRA HEAD FINDINGS Tortuous right cervical ICA. Symmetric carotid arteries and branching. No major branch occlusion or flow limiting stenosis. No unusual vertebrobasilar branching. No flow limiting stenosis or major branch occlusion. 2 mm outpouching directed inferiorly from the  right supraclinoid ICA, with no visible neighboring vessel. IMPRESSION: 1. No acute finding, including infarct. 2. Chronic microvascular disease and atrophy. 3. No acute finding on intracranial MRA.  No flow limiting stenosis. 4. 2 mm right supraclinoid ICA aneurysm or infundibulum. Electronically Signed   By: Monte Fantasia M.D.   On: 10/07/2016 06:44     Assessment/Plan: Diagnosis: Debility, rhabdo related to recent fall.  1. Does the need for close, 24 hr/day medical supervision in concert with the patient's rehab needs make it unreasonable for this patient to be served in a less intensive setting? Yes 2. Co-Morbidities requiring supervision/potential complications: htn, gerd, hx of cva 3. Due to bladder management, bowel management, safety, skin/wound care, disease management and pain management, does the patient require 24 hr/day rehab nursing? Yes 4. Does the patient require coordinated care of a physician, rehab nurse, PT (1-2 hrs/day, 5 days/week) and OT (2-3 hrs/day, 5 days/week) to address physical and functional deficits in the context of the above medical diagnosis(es)? Yes Addressing deficits in the following areas: balance, endurance, locomotion, strength, transferring, bowel/bladder control, bathing, dressing, feeding, grooming and toileting 5. Can the patient actively participate in an intensive therapy program of at least 3 hrs of therapy per day at least 5 days per week? Yes 6. The potential for patient to make measurable gains while on inpatient rehab is excellent 7. Anticipated functional outcomes upon discharge from inpatient rehab are modified independent  with PT, modified independent and supervision with OT, modified independent with SLP. 8. Estimated rehab length of stay to reach the above functional goals is: 8-12 days 9. Does the patient have adequate social supports and living environment to accommodate these discharge functional goals? Yes 10. Anticipated D/C setting:  Home 11. Anticipated post D/C treatments: HH therapy and Outpatient therapy 12. Overall Rehab/Functional Prognosis: excellent  RECOMMENDATIONS: This patient's condition is appropriate for continued rehabilitative care in the following setting: CIR Patient has agreed to participate in recommended program. Yes and Potentially Note that insurance prior authorization may be required for reimbursement for recommended care.  Comment: Rehab Admissions Coordinator to follow up.  Thanks,  Emily Staggers, MD, [facility]     10/08/2016    Revision History                        Routing History

## 2016-10-09 NOTE — Progress Notes (Signed)
Patient admitted to [phone] via wheelchair, escorted by nursing staff and family.  Patient and family verbalized understanding of rehab process, signed fall safety agreement.  Patient has 2x2 pressure injury (blister) on left heel.  Patient very anxious, jumping at every noise.  Brita Romp, RN

## 2016-10-10 ENCOUNTER — Inpatient Hospital Stay (HOSPITAL_COMMUNITY): Payer: Medicare Other | Admitting: Occupational Therapy

## 2016-10-10 ENCOUNTER — Inpatient Hospital Stay (HOSPITAL_COMMUNITY): Payer: Medicare Other | Admitting: Physical Therapy

## 2016-10-10 ENCOUNTER — Inpatient Hospital Stay (HOSPITAL_COMMUNITY): Payer: Medicare Other | Admitting: Speech Pathology

## 2016-10-10 DIAGNOSIS — R5381 Other malaise: Principal | ICD-10-CM

## 2016-10-10 DIAGNOSIS — F411 Generalized anxiety disorder: Secondary | ICD-10-CM

## 2016-10-10 DIAGNOSIS — N182 Chronic kidney disease, stage 2 (mild): Secondary | ICD-10-CM

## 2016-10-10 DIAGNOSIS — I10 Essential (primary) hypertension: Secondary | ICD-10-CM

## 2016-10-10 LAB — COMPREHENSIVE METABOLIC PANEL
ALBUMIN: 3.2 g/dL — AB (ref 3.5–5.0)
ALT: 20 U/L (ref 14–54)
ANION GAP: 6 (ref 5–15)
AST: 30 U/L (ref 15–41)
Alkaline Phosphatase: 50 U/L (ref 38–126)
BUN: 16 mg/dL (ref 6–20)
CO2: 25 mmol/L (ref 22–32)
Calcium: 8.9 mg/dL (ref 8.9–10.3)
Chloride: 107 mmol/L (ref 101–111)
Creatinine, Ser: 0.92 mg/dL (ref 0.44–1.00)
GFR calc non Af Amer: 57 mL/min — ABNORMAL LOW (ref >60)
GLUCOSE: 115 mg/dL — AB (ref 65–99)
POTASSIUM: 3.8 mmol/L (ref 3.5–5.1)
Sodium: 138 mmol/L (ref 135–145)
Total Bilirubin: 1.1 mg/dL (ref 0.3–1.2)
Total Protein: 5.8 g/dL — ABNORMAL LOW (ref 6.5–8.1)

## 2016-10-10 LAB — CBC WITH DIFFERENTIAL/PLATELET
BASOS PCT: 1 %
Basophils Absolute: 0 10*3/uL (ref 0.0–0.1)
EOS ABS: 0.3 10*3/uL (ref 0.0–0.7)
EOS PCT: 6 %
HCT: 33 % — ABNORMAL LOW (ref 36.0–46.0)
HEMOGLOBIN: 11.2 g/dL — AB (ref 12.0–15.0)
Lymphocytes Relative: 19 %
Lymphs Abs: 1.1 10*3/uL (ref 0.7–4.0)
MCH: 32.3 pg (ref 26.0–34.0)
MCHC: 33.9 g/dL (ref 30.0–36.0)
MCV: 95.1 fL (ref 78.0–100.0)
MONOS PCT: 9 %
Monocytes Absolute: 0.5 10*3/uL (ref 0.1–1.0)
NEUTROS PCT: 65 %
Neutro Abs: 3.8 10*3/uL (ref 1.7–7.7)
PLATELETS: 222 10*3/uL (ref 150–400)
RBC: 3.47 MIL/uL — ABNORMAL LOW (ref 3.87–5.11)
RDW: 12.5 % (ref 11.5–15.5)
WBC: 5.8 10*3/uL (ref 4.0–10.5)

## 2016-10-10 LAB — GLUCOSE, CAPILLARY
GLUCOSE-CAPILLARY: 121 mg/dL — AB (ref 65–99)
GLUCOSE-CAPILLARY: 138 mg/dL — AB (ref 65–99)
GLUCOSE-CAPILLARY: 135 mg/dL — AB (ref 65–99)
Glucose-Capillary: 100 mg/dL — ABNORMAL HIGH (ref 65–99)

## 2016-10-10 MED ORDER — BISACODYL 10 MG RE SUPP
10.0000 mg | Freq: Every day | RECTAL | Status: DC | PRN
Start: 2016-10-10 — End: 2016-10-16
  Administered 2016-10-10 – 2016-10-13 (×2): 10 mg via RECTAL
  Filled 2016-10-10 (×2): qty 1

## 2016-10-10 MED ORDER — ALPRAZOLAM 0.25 MG PO TABS
0.2500 mg | ORAL_TABLET | Freq: Three times a day (TID) | ORAL | Status: DC
  Administered 2016-10-10 – 2016-10-16 (×17): 0.25 mg via ORAL
  Filled 2016-10-10 (×17): qty 1

## 2016-10-10 NOTE — Evaluation (Signed)
Physical Therapy Assessment and Plan  Patient Details  Name: [name] MRN: [id number] Date of Birth: [birth year]  PT Diagnosis: Abnormality of gait, Coordination disorder, Impaired cognition and Muscle weakness Rehab Potential: Good ELOS: 8-12 days   Today's Date: 10/10/2016 PT Individual Time: 4132-4401 PT Individual Time Calculation (min): 60 min     Problem List:  Patient Active Problem List   Diagnosis Date Noted  . Debilitated 10/09/2016  . Transient cerebral ischemia   . Anxiety state   . Controlled type 2 diabetes mellitus with complication, without long-term current use of insulin ([place])   . History of breast cancer   . Acute blood loss anemia   . Stage 2 chronic kidney disease   . Syncope 10/07/2016  . Stroke ([place]) 10/06/2016  . Stroke (cerebrum) ([place]) 10/06/2016  . Aphasia 10/06/2016  . Leukocytosis 10/06/2016  . Essential hypertension 10/06/2016  . Hypothyroidism 10/06/2016  . GERD (gastroesophageal reflux disease) 10/06/2016  . Hyperlipemia 10/06/2016    Past Medical History:  Past Medical History:  Diagnosis Date  . Arthritis   . Cancer [facility])    breast cancer on left - 10  years ago  . Diabetes mellitus without complication ([place])   . GERD (gastroesophageal reflux disease)   . Hypertension   . Hypothyroidism   . Stroke ([place])    2014  . Wears glasses    Past Surgical History:  Past Surgical History:  Procedure Laterality Date  . ABDOMINAL HYSTERECTOMY    . APPENDECTOMY    . ARCUATE KERATECTOMY    . BUNIONECTOMY     right  . CARPAL TUNNEL RELEASE     right  . CARPAL TUNNEL RELEASE Left 05/24/2014   Procedure: LEFT CARPAL TUNNEL RELEASE LEFT THUMB TRIGGER FINGER RELEASE;  Surgeon: Cammie Sickle, MD;  Location: [place];  Service: Orthopedics;  Laterality: Left;  left thumb also site of incision  . DEBRIDEMENT OF ABDOMINAL WALL ABSCESS  1978  . OVARIAN CYST SURGERY     x2-  . TONSILLECTOMY    . TRIGGER FINGER RELEASE  Left 01/25/2016   Procedure: RELEASE A-1 PULLEY LEFT RING FINGER, LEFT SMALL FINGER;  Surgeon: Daryll Brod, MD;  Location: [place];  Service: Orthopedics;  Laterality: Left;  ANESTHESIA: IV REGIONAL FAB    Assessment & Plan Clinical Impression: An 82 y.o. right handed female with history of diabetes mellitus, hypertension, breast cancer, CVA with residual tremor right side. Per chart review patient lives alone and was independent with ADLs and mobility prior to admission with progressive decline over the past 6 weeks. Her husband has dementia and is currently in a skilled nursing facility. Presented 10/06/2016 after being found face down on the kitchen floor of her home by her sister with altered mental status as well as bouts of nausea and vomiting. Reports of progressive weakness over the past 6 weeks. Recent imaging of abdomen by PCP unremarkable. Patient was scheduled to have an upper GI series performed prior to this latest event. Patient has had a 40 pound weight loss since the beginning of January. Blood pressure elevated 172/116, WBC 16,400, creatinine 1.08, CPK 2341. Troponin negative. CT and imaging revealed hypoattenuation of the left frontoparietal  lobe. Patient did not receive TPA. MRI/MRA of the brain showed no acute finding. Chronic microvascular disease and atrophy. There was a 2 mm right supraclinoid ICA aneurysm. EEG negative for seizure. Carotid Dopplers completed showing right internal carotid artery 40-59% stenosis. Echocardiogram with 60% ejection  fraction grade 1 diastolic dysfunction without emboli. UGI with KUB no esophageal stricture or mass. Mild esophageal reflux. Normal duodenum.Marland Kitchen Neurology consulted workup presently ongoing currently maintained on aspirin for CVA prophylaxis. Subcutaneous Lovenox for DVT prophylaxis. Maintain on a regular diet.  Physical and Occupational therapy evaluation completed with recommendations of physical medicine rehabilitation consult.   Patient to be admitted for comprehensive inpatient rehabilitation program.  Patient transferred to CIR on 10/09/2016 .   Patient currently requires min with mobility secondary to muscle weakness, decreased cardiorespiratoy endurance, decreased awareness, decreased problem solving and decreased safety awareness and decreased standing balance, decreased postural control and decreased balance strategies.  Prior to hospitalization, patient was independent  with mobility and lived with Alone in a House home.  Home access is  Level entry.  Patient will benefit from skilled PT intervention to maximize safe functional mobility, minimize fall risk and decrease caregiver burden for planned discharge home with 24 hour supervision.  Anticipate patient will benefit from follow up OP at discharge.  PT - End of Session Activity Tolerance: Tolerates 30+ min activity with multiple rests Endurance Deficit: Yes PT Assessment Rehab Potential (ACUTE/IP ONLY): Good Barriers to Discharge: Decreased caregiver support PT Patient demonstrates impairments in the following area(s): Balance;Endurance;Safety PT Transfers Functional Problem(s): Bed Mobility;Bed to Chair;Car;Furniture PT Locomotion Functional Problem(s): Stairs;Ambulation PT Plan PT Intensity: Minimum of 1-2 x/day ,45 to 90 minutes PT Frequency: 5 out of 7 days PT Duration Estimated Length of Stay: 8-12 days PT Treatment/Interventions: Ambulation/gait training;Community reintegration;DME/adaptive equipment instruction;Neuromuscular re-education;Psychosocial support;Stair training;UE/LE Strength taining/ROM;UE/LE Coordination activities;Therapeutic Activities;Discharge planning;Balance/vestibular training;Cognitive remediation/compensation;Functional mobility training;Patient/family education;Splinting/orthotics;Therapeutic Exercise PT Transfers Anticipated Outcome(s): supervision PT Locomotion Anticipated Outcome(s): supervision ambulatory PT  Recommendation Follow Up Recommendations: Outpatient PT;24 hour supervision/assistance Patient destination: Home Equipment Recommended: To be determined  Skilled Therapeutic Intervention Pt resting in bed on arrival, no c/o pain at rest, and agreeable to therapy session.  Pt reports upset stomach with frequent belching but continued to participate throughout session.  PT provided pt education on role of PT, goals of therapy, plan of care, ELOS, and safety plan.  PT instructed pt in transfers, ambulation, and balance activities, as below.  Pt returned to room at end of session and positioned to comfort in bed, with call bell in reach and needs met.   PT Evaluation Precautions/Restrictions Precautions Precautions: Fall Precaution Comments: reaches for furniture Restrictions Weight Bearing Restrictions: No Pain Pain Assessment Pain Assessment: No/denies pain Home Living/Prior Functioning Home Living Available Help at Discharge: Family;Friend(s);Available PRN/intermittently Type of Home: House Home Access: Level entry Home Layout: One level  Lives With: Alone Prior Function Level of Independence: Independent with gait;Independent with transfers Driving: Yes Vocation: Retired  Associate Professor Overall Cognitive Status: Impaired/Different from baseline Arousal/Alertness: Awake/alert Orientation Level: Oriented X4 Safety/Judgment: Impaired Sensation Sensation Light Touch: Appears Intact (not able to distinguish R/L when stimulated at the same time) Coordination Gross Motor Movements are Fluid and Coordinated: Yes Fine Motor Movements are Fluid and Coordinated: No Finger Nose Finger Test: past pointing bilaterally Heel Shin Test: functional Motor  Motor Motor: Within Functional Limits  Mobility Bed Mobility Bed Mobility: Supine to Sit Supine to Sit: 5: Supervision Transfers Transfers: Yes Sit to Stand: 4: Min assist Stand to Sit: 4: Min assist Locomotion   Ambulation Ambulation: Yes Ambulation/Gait Assistance: 4: Min assist Ambulation Distance (Feet): 250 Feet Assistive device: 1 person hand held assist;None Ambulation/Gait Assistance Details: Verbal cues for technique;Verbal cues for gait pattern;Verbal cues for precautions/safety Gait Gait: Yes Gait Pattern: Impaired Gait  Pattern: Shuffle;Decreased stride length Stairs / Additional Locomotion Stairs: Yes Stairs Assistance: 4: Min assist Stair Management Technique: Two rails Number of Stairs: 12 Ramp: 4: Min assist Curb: 4: Min Administrator Mobility: No  Trunk/Postural Assessment  Cervical Assessment Cervical Assessment: Within Functional Limits Thoracic Assessment Thoracic Assessment: Within Functional Limits Lumbar Assessment Lumbar Assessment: Within Functional Limits Postural Control Postural Control: Within Functional Limits  Balance Balance Balance Assessed: Yes Static Sitting Balance Static Sitting - Balance Support: No upper extremity supported;Feet supported Static Sitting - Level of Assistance: 5: Stand by assistance Dynamic Sitting Balance Dynamic Sitting - Balance Support: Left upper extremity supported;Right upper extremity supported;During functional activity;Feet supported Dynamic Sitting - Level of Assistance: 5: Stand by assistance Static Standing Balance Static Standing - Balance Support: During functional activity;Right upper extremity supported;Left upper extremity supported;No upper extremity supported Static Standing - Level of Assistance: 4: Min assist Dynamic Standing Balance Dynamic Standing - Balance Support: Left upper extremity supported;Right upper extremity supported;During functional activity;No upper extremity supported Dynamic Standing - Level of Assistance: 4: Min assist Extremity Assessment      RLE Assessment RLE Assessment: Exceptions to [facility] RLE AROM (degrees) RLE Overall AROM Comments: WFL assessed in  sitting RLE Strength Right Hip Flexion: 3+/5 Right Knee Flexion: 4+/5 Right Knee Extension: 4+/5 Right Ankle Dorsiflexion: 4/5 Right Ankle Plantar Flexion: 4/5 LLE Assessment LLE Assessment: Exceptions to WFL LLE AROM (degrees) LLE Overall AROM Comments: WFL assessed in sitting LLE Strength Left Hip Flexion: 3+/5 Left Knee Flexion: 4+/5 Left Knee Extension: 4+/5 Left Ankle Dorsiflexion: 4/5 Left Ankle Plantar Flexion: 4/5   See Function Navigator for Current Functional Status.   Refer to Care Plan for Long Term Goals  Recommendations for other services: None  Discharge Criteria: Patient will be discharged from PT if patient refuses treatment 3 consecutive times without medical reason, if treatment goals not met, if there is a change in medical status, if patient makes no progress towards goals or if patient is discharged from hospital.  The above assessment, treatment plan, treatment alternatives and goals were discussed and mutually agreed upon: by patient and by family  Evie Lacks 10/10/2016, 4:38 PM

## 2016-10-10 NOTE — Progress Notes (Signed)
Patient information reviewed and entered into eRehab system by Rida Loudin, RN, CRRN, PPS Coordinator.  Information including medical coding and functional independence measure will be reviewed and updated through discharge.     Per nursing patient was given "Data Collection Information Summary for Patients in Inpatient Rehabilitation Facilities with attached "Privacy Act Statement-Health Care Records" upon admission.  

## 2016-10-10 NOTE — Progress Notes (Signed)
[facility] PHYSICAL MEDICINE & REHABILITATION     PROGRESS NOTE    Subjective/Complaints: Anxious overnight. Feeling calm and ready for therapy when I arrived this morning. Apparently became quite anxious and upset later on this morning however.   ROS: Pt denies fever, rash/itching, headache, blurred or double vision, nausea, vomiting, abdominal pain, diarrhea, chest pain, shortness of breath, palpitations, dysuria, dizziness, neck pain, back pain, bleeding,   Objective: Vital Signs: Blood pressure (!) 144/86, pulse (!) 57, temperature 98.1 F (36.7 C), temperature source Oral, resp. rate 17, height 5' (1.524 m), weight 54.5 kg (120 lb 1.6 oz), SpO2 100 %. Dg Ugi  W/kub  Result Date: 10/08/2016 CLINICAL DATA:  Difficulty swallowing.  Dysphagia. EXAM: UPPER GI SERIES WITH KUB TECHNIQUE: After obtaining a scout radiograph a routine upper GI series was performed using thin barium FLUOROSCOPY TIME:  Fluoroscopy Time:  1 minutes 18 seconds Radiation Exposure Index (if provided by the fluoroscopic device): 0.9 mGy Number of Acquired Spot Images: 21 COMPARISON:  MRI upper abdomen 09/17/2016 FINDINGS: Exam was limited due to patient immobility. Patient was examined in a 30 degree supine position. Initial KUB demonstrates no bowel obstruction. No mucosal irregularity stricture or mass of the esophagus. The GE junction was widely patent. Mild gastroesophageal reflux. There is no mucosal irregularity, stricture or mass of the stomach. The duodenum bulb is normal. The C-loop and more distal third and fourth portion the duodenum is normal. IMPRESSION: 1. No esophageal stricture mass or obstruction. 2. Mild esophageal reflux. 3. Normal stomach without evidence of mass or obstruction. 4. Normal duodenum. 5. Exam is somewhat limited due to patient immobility and single contrast. Electronically Signed   By: [name] M.D.   On: 10/08/2016 10:28    Recent Labs  10/09/16 1829 10/10/16 0727  WBC 6.9 5.8   HGB 11.7* 11.2*  HCT 33.5* 33.0*  PLT 230 222    Recent Labs  10/09/16 1829 10/10/16 0727  NA  --  138  K  --  3.8  CL  --  107  GLUCOSE  --  115*  BUN  --  16  CREATININE 1.04* 0.92  CALCIUM  --  8.9   CBG (last 3)   Recent Labs  10/09/16 1633 10/09/16 2040 10/10/16 0643  GLUCAP 98 151* 121*    Wt Readings from Last 3 Encounters:  10/09/16 54.5 kg (120 lb 1.6 oz)  10/06/16 65.8 kg (145 lb)  01/25/16 65.9 kg (145 lb 4 oz)    Physical Exam:  Sitting in bed, no distress HENT:  Head: Normocephalic.  Multiple bruises to the face over left eye Eyes: PERRL Neck: Normal range of motion. Neck supple. No thyromegaly present.  Cardiovascular: RRR no murmur Respiratory: CTA bilaerally, no wheezes GI: Soft. NTND.  Musculoskeletal: She exhibits no edema or tenderness. At rest or with ROM.  Neurological: She is alert.  No anxiety when I saw her.  She is able to provide her name and her age and place. Followed simple commands Motor: 4+/5 throughout bilateral upper and lower ext. No ataxia Sensation intact to light touch in all 4's. No focal CN findings DTRs symmetric.  Skin: Skin is warm and dry throughout.  Psychiatric: calm and appropriate when I visited.   Assessment/Plan: 1. Functional deficits secondary to debility/rhabdomyolysis which require 3+ hours per day of interdisciplinary therapy in a comprehensive inpatient rehab setting. Physiatrist is providing close team supervision and 24 hour management of active medical problems listed below. Physiatrist and rehab team  continue to assess barriers to discharge/monitor patient progress toward functional and medical goals.  Function:  Bathing Bathing position      Bathing parts      Bathing assist        Upper Body Dressing/Undressing Upper body dressing                    Upper body assist        Lower Body Dressing/Undressing Lower body dressing                                   Lower body assist        Toileting Toileting          Toileting assist     Transfers Chair/bed transfer             Locomotion Ambulation           Wheelchair          Cognition Comprehension    Expression    Social Interaction    Problem Solving    Memory     Medical Problem List and Plan: 1.  Debilitation secondary to rhabdomyolysis/multi-medical  -begin therapies today 2.  DVT Prophylaxis/Anticoagulation: Subcutaneous Lovenox. Monitor platelet counts and any signs of bleeding 3. Pain Management: Tylenol as needed 4. Mood: Xanax 0.25 mg 3 times a day as needed  -apparently was taking scheduled at home---will schedule TID  -?increasing depression/anxiety/cognitive decline leading up this admit?  -will request neuropsych evaluation during stay 5. Neuropsych: This patient is not fully capable of making decisions on her own behalf. 6. Skin/Wound Care: Routine skin checks. Adequate nutrition 7. Fluids/Electrolytes/Nutrition: I personally reviewed all of the patient's labs today, and lab work is within normal limits.  8. Hypertension. Plendil 5 mg daily, Cozaar 100 mg daily.   -fair control at present 9. Diabetes mellitus. Hemoglobin A1c 6.2. Check blood sugars before meals and at bedtime.SSI. Patient on Glucophage 750 mg daily prior to admission.  -reasonable control at present  -intake inconsistent  -Resume glucophage as needed 10. Hypothyroidism. Synthroid. Latest TSH 1.510 11. Hyperlipidemia. Lipitor 12. History of left breast cancer. 13. ABLA:  11.2--stable 14. CKD: Cr 0.9  LOS (Days) 1 A FACE TO FACE EVALUATION WAS PERFORMED  Kherington Meraz T 10/10/2016 10:01 AM

## 2016-10-10 NOTE — Evaluation (Signed)
Speech Language Pathology Assessment and Plan  Patient Details  Name: [name] MRN: [id number] Date of Birth: 27-Feb-1934  SLP Diagnosis: Cognitive Impairments  Rehab Potential: Good ELOS: 7-10 days    Today's Date: 10/10/2016 SLP Individual Time: 5462-7035 SLP Individual Time Calculation (min): 60 min    Problem List:  Patient Active Problem List   Diagnosis Date Noted  . Debilitated 10/09/2016  . Transient cerebral ischemia   . Anxiety state   . Controlled type 2 diabetes mellitus with complication, without long-term current use of insulin ([place])   . History of breast cancer   . Acute blood loss anemia   . Stage 2 chronic kidney disease   . Syncope 10/07/2016  . Stroke ([place]) 10/06/2016  . Stroke (cerebrum) ([place]) 10/06/2016  . Aphasia 10/06/2016  . Leukocytosis 10/06/2016  . Essential hypertension 10/06/2016  . Hypothyroidism 10/06/2016  . GERD (gastroesophageal reflux disease) 10/06/2016  . Hyperlipemia 10/06/2016   Past Medical History:  Past Medical History:  Diagnosis Date  . Arthritis   . Cancer [facility])    breast cancer on left - 10  years ago  . Diabetes mellitus without complication ([place])   . GERD (gastroesophageal reflux disease)   . Hypertension   . Hypothyroidism   . Stroke ([place])    2014  . Wears glasses    Past Surgical History:  Past Surgical History:  Procedure Laterality Date  . ABDOMINAL HYSTERECTOMY    . APPENDECTOMY    . ARCUATE KERATECTOMY    . BUNIONECTOMY     right  . CARPAL TUNNEL RELEASE     right  . CARPAL TUNNEL RELEASE Left 05/24/2014   Procedure: LEFT CARPAL TUNNEL RELEASE LEFT THUMB TRIGGER FINGER RELEASE;  Surgeon: Cammie Sickle, MD;  Location: [place];  Service: Orthopedics;  Laterality: Left;  left thumb also site of incision  . DEBRIDEMENT OF ABDOMINAL WALL ABSCESS  1978  . OVARIAN CYST SURGERY     x2-  . TONSILLECTOMY    . TRIGGER FINGER RELEASE Left 01/25/2016   Procedure: RELEASE A-1 PULLEY  LEFT RING FINGER, LEFT SMALL FINGER;  Surgeon: Daryll Brod, MD;  Location: [place];  Service: Orthopedics;  Laterality: Left;  ANESTHESIA: IV REGIONAL FAB    Assessment / Plan / Recommendation Clinical Impression Patient is a 82 y.o. right handed female with history of diabetes mellitus, hypertension, breast cancer, CVA with residual tremor right side. Per chart review patient lives alone and was independent with ADLs and mobility prior to admission with progressive decline over the past 6 weeks. Her husband has dementia and is currently in a skilled nursing facility. Presented 10/06/2016 after being found face down on the kitchen floor of her home by her sister with altered mental status as well as bouts of nausea and vomiting. Reports of progressive weakness over the past 6 weeks. Recent imaging of abdomen by PCP unremarkable. Patient was scheduled to have an upper GI series performed prior to this latest event. Patient has had a 40 pound weight loss since the beginning of January. Blood pressure elevated 172/116, WBC 16,400, creatinine 1.08, CPK 2341. Troponin negative. CT and imaging revealed hypoattenuation of the left frontoparietal  lobe. Patient did not receive TPA. MRI/MRA of the brain showed no acute finding. Chronic microvascular disease and atrophy. There was a 2 mm right ICA aneurysm. EEG negative for seizure. Carotid Dopplers completed showing right internal carotid artery 40-59% stenosis. Echocardiogram with 60% ejection fraction grade 1 diastolic  dysfunction without emboli. UGI with KUB no esophageal stricture or mass. Mild esophageal reflux. Normal duodenum.Marland Kitchen Neurology consulted, maintained on aspirin for CVA prophylaxis. Subcutaneous Lovenox for DVT prophylaxis. Maintain on a regular diet.Physical and Occupational therapy evaluation completed with recommendations of physical medicine rehabilitation consultPatient was admitted for comprehensive rehabilitation program on  10/09/16.   Patient was administered the [facility] Cognitive Assessment) and scored 15/22 points with 18 points or above considered WFL. Patient showed deficits in the domains of problem solving, short term recall, safety, following mildly complex mutli-step directions, and selective attention in standardized assessment. During informal assessment patient became emotional when discussing recent life events, requiring anti-anxiety medication. Patient would benefit from skilled ST intervention to maximize cognitive function and overall functional independence prior to discharge.     Skilled Therapeutic Interventions          Please see cognitive-linguistic evaluation results in above section.   SLP Assessment  Patient will need skilled [place] [facility] during CIR admission    Recommendations  Oral Care Recommendations: Oral care BID Recommendations for Other Services: Neuropsych consult Patient destination: Home Follow up Recommendations: 24 hour supervision/assistance Equipment Recommended: To be determined    SLP Frequency 3 to 5 out of 7 days   SLP Duration  SLP Intensity  SLP Treatment/Interventions 7-10 days  Minumum of 1-2 x/day, 30 to 90 minutes  Cognitive remediation/compensation;Cueing hierarchy;Functional tasks;Environmental controls;Internal/external aids;Patient/family education;Therapeutic Activities    Pain Pain Assessment Pain Assessment: No/denies pain  Prior Functioning Cognitive/Linguistic Baseline: Within functional limits Type of Home: House  Lives With: Alone Available Help at Discharge: Family;Friend(s);Available PRN/intermittently Vocation: Retired  Function:  Cognition Comprehension Comprehension assist level: Understands basic 90% of the time/cues < 10% of the time  Expression   Expression assist level: Expresses basic needs/ideas: With extra time/assistive device  Social Interaction Social Interaction assist level:  Interacts appropriately with others with medication or extra time (anti-anxiety, antidepressant).  Problem Solving Problem solving assist level: Solves basic 75 - 89% of the time/requires cueing 10 - 24% of the time  Memory Memory assist level: Recognizes or recalls 50 - 74% of the time/requires cueing 25 - 49% of the time   Short Term Goals: Week 1: SLP Short Term Goal 1 (Week 1): Patient will utilize memory strategies and external memory aids for storage and retrieval of new, daily information given supervision verbal cues.  SLP Short Term Goal 2 (Week 1): Patient will demonstrate Mod I for anticipatory awareness of safety needs and assistance required during daily tasks.  SLP Short Term Goal 3 (Week 1): Patient will demonstrate functional problem solving of mildly complex tasks given supervision verbal cues. SLP Short Term Goal 4 (Week 1): Patient will demonstrate selective attention in a minimally distracting environment given supervision verbal cues. SLP Short Term Goal 5 (Week 1): Patient will follow mildly complex multistep directions given supervision verbal cues.   Refer to Care Plan for Long Term Goals  Recommendations for other services: Neuropsych  Discharge Criteria: Patient will be discharged from SLP if patient refuses treatment 3 consecutive times without medical reason, if treatment goals not met, if there is a change in medical status, if patient makes no progress towards goals or if patient is discharged from hospital.  The above assessment, treatment plan, treatment alternatives and goals were discussed and mutually agreed upon: by patient and by family  Thornton Papas 10/10/2016, 1:01 PM

## 2016-10-10 NOTE — Evaluation (Signed)
Occupational Therapy Assessment and Plan  Patient Details  Name: [name] MRN: [id number] Date of Birth: 06-19-1934  OT Diagnosis: cognitive deficits, muscle weakness (generalized) and coordination disorder Rehab Potential: Rehab Potential (ACUTE ONLY): Fair ELOS: 7-10 days   Today's Date: 10/10/2016 OT Individual Time: 1015-1130 OT Individual Time Calculation (min): 75 min      Problem List: Patient Active Problem List   Diagnosis Date Noted  . Debilitated 10/09/2016  . Transient cerebral ischemia   . Anxiety state   . Controlled type 2 diabetes mellitus with complication, without long-term current use of insulin ([place])   . History of breast cancer   . Acute blood loss anemia   . Stage 2 chronic kidney disease   . Syncope 10/07/2016  . Stroke ([place]) 10/06/2016  . Stroke (cerebrum) ([place]) 10/06/2016  . Aphasia 10/06/2016  . Leukocytosis 10/06/2016  . Essential hypertension 10/06/2016  . Hypothyroidism 10/06/2016  . GERD (gastroesophageal reflux disease) 10/06/2016  . Hyperlipemia 10/06/2016    Past Medical History:  Past Medical History:  Diagnosis Date  . Arthritis   . Cancer [facility])    breast cancer on left - 10  years ago  . Diabetes mellitus without complication ([place])   . GERD (gastroesophageal reflux disease)   . Hypertension   . Hypothyroidism   . Stroke ([place])    2014  . Wears glasses    Past Surgical History:  Past Surgical History:  Procedure Laterality Date  . ABDOMINAL HYSTERECTOMY    . APPENDECTOMY    . ARCUATE KERATECTOMY    . BUNIONECTOMY     right  . CARPAL TUNNEL RELEASE     right  . CARPAL TUNNEL RELEASE Left 05/24/2014   Procedure: LEFT CARPAL TUNNEL RELEASE LEFT THUMB TRIGGER FINGER RELEASE;  Surgeon: Cammie Sickle, MD;  Location: [place];  Service: Orthopedics;  Laterality: Left;  left thumb also site of incision  . DEBRIDEMENT OF ABDOMINAL WALL ABSCESS  1978  . OVARIAN CYST SURGERY     x2-  . TONSILLECTOMY    .  TRIGGER FINGER RELEASE Left 01/25/2016   Procedure: RELEASE A-1 PULLEY LEFT RING FINGER, LEFT SMALL FINGER;  Surgeon: Daryll Brod, MD;  Location: [place];  Service: Orthopedics;  Laterality: Left;  ANESTHESIA: IV REGIONAL FAB    Assessment & Plan Clinical Impression: Patient is a 82 y.o. year old femalewith history of diabetes mellitus, hypertension, breast cancer, CVA with residual tremor right side. Per chart review patient lives alone and was independent with ADLs and mobility prior to admission with progressive decline over the past 6 weeks. Her husband has dementia and is currently in a skilled nursing facility. Presented 10/06/2016 after being found face down on the kitchen floor of her home by her sister with altered mental status as well as bouts of nausea and vomiting. Reports of progressive weakness over the past 6 weeks. Recent imaging of abdomen by PCP unremarkable. Patient was scheduled to have an upper GI series performed prior to this latest event. Patient has had a 40 pound weight loss since the beginning of January. Blood pressure elevated 172/116, WBC 16,400, creatinine 1.08, CPK 2341. Troponin negative. CT and imaging revealed hypoattenuation of the left frontoparietal lobe. Patient did not receive TPA. MRI/MRA of the brain showed no acute finding. Chronic microvascular disease and atrophy. There was a 2 mm right ICA aneurysm. EEG negative for seizure. Carotid Dopplers completed showing right internal carotid artery 40-59% stenosis. Echocardiogram with 60%  ejection fraction grade 1 diastolic dysfunction without emboli. UGIwith KUB no esophageal stricture or mass. Mild esophageal reflux. Normal duodenum.Marland Kitchen Neurology consulted, maintained on aspirin for CVA prophylaxis. Subcutaneous Lovenox for DVT prophylaxis. .  Patient transferred to CIR on 10/09/2016 .    Patient currently requires min with basic self-care skills and IADL secondary to muscle weakness, decreased  cardiorespiratoy endurance, decreased coordination and decreased motor planning, decreased problem solving, decreased safety awareness and decreased memory and decreased sitting balance, decreased standing balance and decreased balance strategies.  Prior to hospitalization, patient could complete ADLs and IADLs with independent .  Patient will benefit from skilled intervention to increase independence with basic self-care skills prior to discharge home with care partner.  Anticipate patient will require 24 hour supervision and follow up outpatient.  OT - End of Session Activity Tolerance: Decreased this session Endurance Deficit: Yes Endurance Deficit Description: mutliple rest breaks secondry to fatigue OT Assessment Rehab Potential (ACUTE ONLY): Fair OT Patient demonstrates impairments in the following area(s): Balance;Cognition;Endurance;Pain;Safety OT Basic ADL's Functional Problem(s): Grooming;Bathing;Dressing;Toileting OT Advanced ADL's Functional Problem(s): Laundry;Light Housekeeping;Simple Meal Preparation OT Transfers Functional Problem(s): Toilet;Tub/Shower OT Additional Impairment(s): None OT Plan OT Intensity: Minimum of 1-2 x/day, 45 to 90 minutes OT Frequency: 5 out of 7 days OT Duration/Estimated Length of Stay: 7-10 days OT Treatment/Interventions: Balance/vestibular training;Cognitive remediation/compensation;Community reintegration;Discharge planning;Functional mobility training;Psychosocial support;Therapeutic Activities;UE/LE Coordination activities;Patient/family education;DME/adaptive equipment instruction;UE/LE Strength taining/ROM;Pain management;Self Care/advanced ADL retraining;Therapeutic Exercise OT Self Feeding Anticipated Outcome(s): n/a OT Basic Self-Care Anticipated Outcome(s): supervision OT Toileting Anticipated Outcome(s): supervision OT Bathroom Transfers Anticipated Outcome(s): supervision OT Recommendation Patient destination: Home Follow Up  Recommendations: 24 hour supervision/assistance Equipment Recommended: To be determined   Skilled Therapeutic Intervention Upon entering the room, pt seated in wheelchair with sister present in room. Pt with no c/o pain this session. SLP noted pt very anxious prior to this session. OT providing heavy education and step by step plans in order for pt to feel more in control and able to engage in therapist without feelings of being overwelmed. OT educated pt and caregiver on OT purpose , POC, and goals this session. Pt ambulated with hand held steady assistance to shower for bathing from shower level. Pt declined sitting in shower chair and stood with steady assistance for balance. Pt needing several cues throughout session in regards to impulsivity with movements. Pt dressing from wheelchair with set up A for UB and steady assistance for LB dressing. Pt standing at sink for grooming tasks with steady assistance for balance. Pt ambulating with hand held assist around unit to orient her in order to continue to ease anxiety. Pt returning to wheelchair at end of session with no c/o pian.   OT Evaluation Precautions/Restrictions  Precautions Precautions: Fall Precaution Comments: very anxious    Pain Pain Assessment Pain Assessment: No/denies pain Home Living/Prior Functioning Home Living Family/patient expects to be discharged to:: Private residence Living Arrangements: Alone Available Help at Discharge: Family, Friend(s), Available PRN/intermittently Type of Home: House Home Access: Level entry Home Layout: One level Bathroom Shower/Tub: Gaffer, Charity fundraiser: Standard Bathroom Accessibility: Yes  Lives With: Alone Prior Function Level of Independence: Independent with gait, Independent with transfers, Independent with basic ADLs, Independent with homemaking with ambulation Driving: Yes Vocation: Retired ADL   Vision/Perception  Vision- History Baseline Vision/History:  Wears glasses Wears Glasses: At all times Vision- Assessment Vision Assessment?: No apparent visual deficits  Cognition Overall Cognitive Status: Impaired/Different from baseline Arousal/Alertness: Awake/alert Orientation Level: Person;Place;Situation Person: Oriented Place:  Oriented Situation: Oriented Year: 2017 Month: November Day of Week: Incorrect Memory: Impaired Memory Impairment: Retrieval deficit;Storage deficit;Decreased recall of new information;Decreased short term memory Decreased Short Term Memory: Verbal basic;Functional basic Immediate Memory Recall: Sock;Blue;Bed Memory Recall: Blue (1/3) Memory Recall Blue: Without Cue Attention: Selective Selective Attention: Impaired Selective Attention Impairment: Verbal basic;Functional basic Awareness: Appears intact Problem Solving: Impaired Problem Solving Impairment: Functional basic;Verbal basic Behaviors: Impulsive;Lability Safety/Judgment: Impaired Sensation Sensation Light Touch: Appears Intact Coordination Gross Motor Movements are Fluid and Coordinated: Yes Fine Motor Movements are Fluid and Coordinated: No Motor  Motor Motor: Within Functional Limits Mobility  Bed Mobility Bed Mobility: Supine to Sit Supine to Sit: 5: Supervision Transfers Sit to Stand: 4: Min assist Stand to Sit: 4: Min assist  Trunk/Postural Assessment  Cervical Assessment Cervical Assessment: Within Functional Limits Thoracic Assessment Thoracic Assessment: Within Functional Limits Lumbar Assessment Lumbar Assessment: Within Functional Limits Postural Control Postural Control: Within Functional Limits  Balance Balance Balance Assessed: Yes Static Sitting Balance Static Sitting - Level of Assistance: 5: Stand by assistance Dynamic Sitting Balance Dynamic Sitting - Balance Support: Left upper extremity supported;Right upper extremity supported;During functional activity;Feet supported Dynamic Sitting - Level of Assistance:  5: Stand by assistance Static Standing Balance Static Standing - Balance Support: During functional activity;Right upper extremity supported;Left upper extremity supported;No upper extremity supported Static Standing - Level of Assistance: 4: Min assist Dynamic Standing Balance Dynamic Standing - Balance Support: Left upper extremity supported;Right upper extremity supported;During functional activity;No upper extremity supported Dynamic Standing - Level of Assistance: 4: Min assist Extremity/Trunk Assessment RUE Assessment RUE Assessment: Exceptions to [facility] (3+/5) LUE Assessment LUE Assessment: Exceptions to [facility] (3+/5)   See Function Navigator for Current Functional Status.   Refer to Care Plan for Long Term Goals  Recommendations for other services: Neuropsych  Discharge Criteria: Patient will be discharged from OT if patient refuses treatment 3 consecutive times without medical reason, if treatment goals not met, if there is a change in medical status, if patient makes no progress towards goals or if patient is discharged from hospital.  The above assessment, treatment plan, treatment alternatives and goals were discussed and mutually agreed upon: by patient and by family  Gypsy Decant 10/10/2016, 8:10 PM

## 2016-10-11 ENCOUNTER — Inpatient Hospital Stay (HOSPITAL_COMMUNITY): Payer: Medicare Other | Admitting: Physical Therapy

## 2016-10-11 ENCOUNTER — Inpatient Hospital Stay (HOSPITAL_COMMUNITY): Payer: Medicare Other | Admitting: Occupational Therapy

## 2016-10-11 ENCOUNTER — Inpatient Hospital Stay (HOSPITAL_COMMUNITY): Payer: Medicare Other | Admitting: Speech Pathology

## 2016-10-11 DIAGNOSIS — E118 Type 2 diabetes mellitus with unspecified complications: Secondary | ICD-10-CM

## 2016-10-11 LAB — GLUCOSE, CAPILLARY
Glucose-Capillary: 113 mg/dL — ABNORMAL HIGH (ref 65–99)
Glucose-Capillary: 110 mg/dL — ABNORMAL HIGH (ref 65–99)
Glucose-Capillary: 159 mg/dL — ABNORMAL HIGH (ref 65–99)
Glucose-Capillary: 98 mg/dL (ref 65–99)

## 2016-10-11 NOTE — Progress Notes (Signed)
Physical Therapy Session Note  Patient Details  Name: [name] MRN: [id number] Date of Birth: November 27, 1933  Today's Date: 10/11/2016 PT Individual Time: 1000-1045 PT Individual Time Calculation (min): 45 min    Short Term Goals: Week 1:  PT Short Term Goal 1 (Week 1): =LTGs due to ELOS  Skilled Therapeutic Interventions/Progress Updates:  Pt received in w/c with sister present.  No c/o pain but pt does report not sleeping well last night.  Discussed with pt and sister goals set by evaluating PT and recommendation for supervision; sister states that pt will have intermittent supervision-friends and family checking in intermittently but would have to hire for 24/7 supervision-sister also states that pt is very independent and would not be comfortable with someone she doesn't know coming in to her house all day, every day.  Discussed being able to adjust goals if appropriate and will continue to assess if pt will require 24/7 supervision for safety.  Pt requesting to use bathroom.  Performed sit > stand from w/c and ambulated to/from toilet and performed toileting all with min A for balance.  Performed ambulation x 150' in controlled environment with min A and verbal cues for upright gaze and posture.  Performed BERG balance assessment to determine falls risk; see below.  Returned to room and discussed significant falls risk with pt and sister and balance impairments to address during therapy.  Pt verbalized agreement.  Pt left in w/c with sister present and all items within reach.  Therapy Documentation Precautions:  Precautions Precautions: Fall Precaution Comments: very anxious Restrictions Weight Bearing Restrictions: No Balance: Standardized Balance Assessment Standardized Balance Assessment: Berg Balance Test Berg Balance Test Sit to Stand: Able to stand without using hands and stabilize independently Standing Unsupported: Able to stand safely 2 minutes Sitting with Back  Unsupported but Feet Supported on Floor or Stool: Able to sit safely and securely 2 minutes Stand to Sit: Sits safely with minimal use of hands Transfers: Able to transfer safely, minor use of hands Standing Unsupported with Eyes Closed: Able to stand 10 seconds with supervision Standing Ubsupported with Feet Together: Able to place feet together independently and stand for 1 minute with supervision From Standing, Reach Forward with Outstretched Arm: Can reach confidently >25 cm (10") From Standing Position, Pick up Object from Floor: Able to pick up shoe, needs supervision From Standing Position, Turn to Look Behind Over each Shoulder: Looks behind one side only/other side shows less weight shift Turn 360 Degrees: Needs close supervision or verbal cueing Standing Unsupported, Alternately Place Feet on Step/Stool: Able to complete >2 steps/needs minimal assist Standing Unsupported, One Foot in Front: Needs help to step but can hold 15 seconds Standing on One Leg: Able to lift leg independently and hold equal to or more than 3 seconds Total Score: 41 Patient demonstrates increased fall risk as noted by score of 41/56 on Berg Balance Scale.  (<36= high risk for falls, close to 100%; 37-45 significant >80%; 46-51 moderate >50%; 52-55 lower >25%)   See Function Navigator for Current Functional Status.   Therapy/Group: Individual Therapy  [name] [facility] 10/11/2016, 12:37 PM

## 2016-10-11 NOTE — Progress Notes (Signed)
[place] PHYSICAL MEDICINE & REHABILITATION     PROGRESS NOTE    Subjective/Complaints: No apparent issues overnight. Appears to have slept well.   ROS: Pt denies fever, rash/itching, headache, blurred or double vision, nausea, vomiting, abdominal pain, diarrhea, chest pain, shortness of breath, palpitations, dysuria, dizziness, neck pain, back pain, bleeding, anxiety, or depression   Objective: Vital Signs: Blood pressure (!) 142/62, pulse 68, temperature 97.8 F (36.6 C), temperature source Oral, resp. rate 18, height 5' (1.524 m), weight 54.5 kg (120 lb 1.6 oz), SpO2 98 %. No results found.  Recent Labs  10/09/16 1829 10/10/16 0727  WBC 6.9 5.8  HGB 11.7* 11.2*  HCT 33.5* 33.0*  PLT 230 222    Recent Labs  10/09/16 1829 10/10/16 0727  NA  --  138  K  --  3.8  CL  --  107  GLUCOSE  --  115*  BUN  --  16  CREATININE 1.04* 0.92  CALCIUM  --  8.9   CBG (last 3)   Recent Labs  10/10/16 1652 10/10/16 2052 10/11/16 0642  GLUCAP 135* 100* 113*    Wt Readings from Last 3 Encounters:  10/09/16 54.5 kg (120 lb 1.6 oz)  10/06/16 65.8 kg (145 lb)  01/25/16 65.9 kg (145 lb 4 oz)    Physical Exam:  No distress HENT:  Head: Normocephalic.  Multiple bruises to the face over left eye healing Eyes: EOMI Neck: Normal range of motion. Neck supple. No thyromegaly present.  Cardiovascular: RRR Respiratory: CTA B GI: Soft. BS+.  Musculoskeletal: She exhibits no edema or tenderness. At rest or with ROM.  Neurological: She is alert.  CN normal She is able to provide her name and her age and place. Followed simple commands Motor: 4+/5 throughout bilateral upper and lower ext. No ataxia Sensation intact to light touch in all 4's.   DTRs symmetric.  Skin: Skin is warm and dry throughout.  Psychiatric: calm and appropriate when I visited.   Assessment/Plan: 1. Functional deficits secondary to debility/rhabdomyolysis which require 3+ hours per day of  interdisciplinary therapy in a comprehensive inpatient rehab setting. Physiatrist is providing close team supervision and 24 hour management of active medical problems listed below. Physiatrist and rehab team continue to assess barriers to discharge/monitor patient progress toward functional and medical goals.  Function:  Bathing Bathing position   Position: Shower  Bathing parts Body parts bathed by patient: Right arm, Left arm, Chest, Abdomen, Front perineal area, Buttocks, Right upper leg, Left upper leg Body parts bathed by helper: Right lower leg, Left lower leg, Back  Bathing assist Assist Level: Touching or steadying assistance(Pt > 75%)      Upper Body Dressing/Undressing Upper body dressing   What is the patient wearing?: Pull over shirt/dress     Pull over shirt/dress - Perfomed by patient: Put head through opening, Thread/unthread right sleeve, Thread/unthread left sleeve, Pull shirt over trunk          Upper body assist Assist Level: Set up   Set up : To obtain clothing/put away  Lower Body Dressing/Undressing Lower body dressing   What is the patient wearing?: Underwear, Pants, Non-skid slipper socks Underwear - Performed by patient: Thread/unthread right underwear leg, Thread/unthread left underwear leg, Pull underwear up/down   Pants- Performed by patient: Thread/unthread right pants leg, Thread/unthread left pants leg, Pull pants up/down   Non-skid slipper socks- Performed by patient: Don/doff right sock, Don/doff left sock  Lower body assist Assist for lower body dressing: Touching or steadying assistance (Pt > 75%)      Toileting Toileting   Toileting steps completed by patient: Adjust clothing prior to toileting, Performs perineal hygiene, Adjust clothing after toileting   Toileting Assistive Devices: Grab bar or rail  Toileting assist Assist level: Touching or steadying assistance (Pt.75%)   Transfers Chair/bed transfer    Chair/bed transfer method: Ambulatory Chair/bed transfer assist level: Touching or steadying assistance (Pt > 75%)       Locomotion Ambulation     Max distance: 250 Assist level: Touching or steadying assistance (Pt > 75%)   Wheelchair Wheelchair activity did not occur: N/A (pt ambulatory on unit)        Cognition Comprehension Comprehension assist level: Understands basic 90% of the time/cues < 10% of the time  Expression Expression assist level: Expresses basic needs/ideas: With extra time/assistive device  Social Interaction Social Interaction assist level: Interacts appropriately with others with medication or extra time (anti-anxiety, antidepressant).  Problem Solving Problem solving assist level: Solves basic 75 - 89% of the time/requires cueing 10 - 24% of the time  Memory Memory assist level: Recognizes or recalls 50 - 74% of the time/requires cueing 25 - 49% of the time   Medical Problem List and Plan: 1.  Debilitation secondary to rhabdomyolysis/multi-medical  -continue CIR therapy 2.  DVT Prophylaxis/Anticoagulation: Subcutaneous Lovenox. Monitor platelet counts and any signs of bleeding 3. Pain Management: Tylenol as needed--appears controlled 4. Mood: Xanax 0.25 mg 3 times a day as needed  -have also scheduled xanax 0.25mg  TID as she was taking at home  -?increasing depression/anxiety/cognitive decline prior to this admit?  -  neuropsych evaluation requested during stay  -team providing ego support as necessary 5. Neuropsych: This patient is not fully capable of making decisions on her own behalf. 6. Skin/Wound Care: encourage adequate nutrition 7. Fluids/Electrolytes/Nutrition: I personally reviewed all of the patient's labs today, and lab work is within normal limits.  8. Hypertension. Plendil 5 mg daily, Cozaar 100 mg daily.   -fair control at present 9. Diabetes mellitus. Hemoglobin A1c 6.2. Check blood sugars before meals and at bedtime.SSI. Patient on  Glucophage 750 mg daily prior to admission.  -good control at present  -intake inconsistent  -Resume glucophage as needed 10. Hypothyroidism. Synthroid. Latest TSH 1.510 11. Hyperlipidemia. Lipitor 12. History of left breast cancer. 13. ABLA:  11.2--stable 14. CKD: Cr 0.9  LOS (Days) 2 A FACE TO FACE EVALUATION WAS PERFORMED  SWARTZ,ZACHARY T 10/11/2016 9:24 AM

## 2016-10-11 NOTE — Progress Notes (Signed)
Speech Language Pathology Daily Session Note  Patient Details  Name: [name] MRN: [id number] Date of Birth: [birth year]  Today's Date: 10/11/2016 SLP Individual Time: 0930-1000 SLP Individual Time Calculation (min): 30 min   Short Term Goals: Week 1: SLP Short Term Goal 1 (Week 1): Patient will utilize memory strategies and external memory aids for storage and retrieval of new, daily information given supervision verbal cues.  SLP Short Term Goal 2 (Week 1): Patient will demonstrate Mod I for anticipatory awareness of safety needs and assistance required during daily tasks.  SLP Short Term Goal 3 (Week 1): Patient will demonstrate functional problem solving of mildly complex tasks given supervision verbal cues. SLP Short Term Goal 4 (Week 1): Patient will demonstrate selective attention in a minimally distracting environment given supervision verbal cues. SLP Short Term Goal 5 (Week 1): Patient will follow mildly complex multistep directions given supervision verbal cues.   Skilled Therapeutic Interventions: Skilled treatment session focused on cognitive goals. SLP facilitated session by providing extra time and Min A question and visual cues for patient to utilize her schedule to anticipate upcoming therapy sessions. Patient recalled events from morning therapy session with Mod I and recorded events on schedule to maximize short-term recall of information. Patient left upright in wheelchair with sister present. Continue with current plan of care.   Function:  Cognition Comprehension Comprehension assist level: Understands basic 90% of the time/cues < 10% of the time  Expression   Expression assist level: Expresses basic needs/ideas: With extra time/assistive device  Social Interaction Social Interaction assist level: Interacts appropriately with others with medication or extra time (anti-anxiety, antidepressant).  Problem Solving Problem solving assist level: Solves basic 75 - 89% of  the time/requires cueing 10 - 24% of the time  Memory Memory assist level: Recognizes or recalls 50 - 74% of the time/requires cueing 25 - 49% of the time    Pain No/Denies Pain   Therapy/Group: Individual Therapy  [name], [place] 10/11/2016, 3:17 PM

## 2016-10-11 NOTE — Progress Notes (Signed)
Occupational Therapy Session Note  Patient Details  Name: [name] MRN: [id number] Date of Birth: May 02, 1934  Today's Date: 10/11/2016 OT Individual Time: 1259-1401 and [id number] and [id number] OT Individual Time Calculation (min): 62 min and 66 min and 34 minutes   Short Term Goals: Week 1:  OT Short Term Goal 1 (Week 1): STGs=LTGs secondary to estimated short LOS  Skilled Therapeutic Interventions/Progress Updates:    1st Session 1:1 Tx (66 min) Pt was lying supine in bed at time of arrival with reports of not having slept very well due to right hip pain and "silky" sheets. Problem solving completed with pt regarding hemi- sidelying technique with right hip elevated off of bed. "That feels so much better." Nursing was agreeable to leave note for night shift CNA to assist with bed setup. Breakfast arrived and pt sat at EOB to eat. Pt-therapist collaboration completed regarding client-centered goals and OT POC. Pt expressed that housework is a very meaningful occupation to her. After breakfast pt completed bedmaking with new linens while ambulating without device, HHA and min cues for problem solving and sequencing. Two bedcovers used in place of sheet per pts comfort. Tx focus on endurance, balance, minimizing feelings of anxiety, and cognition. At end of session pt was left in w/c with all needs within reach.   2nd Session 1:1 Tx (34 minutes) Pt was agreeable to session prior to lunch. Laundry folding and linen transport completed in room with bed elevated for folding area. Tx focus on standing endurance and increasing feelings of familiarity and comfort in facility. Pt encouraged to place linens in easy accessible areas due to c/o anxiety when needing wash cloths or towels and not knowing where they are. Pt and therapist modified room to address this. Nursing made aware with verbalized understanding. Pt was left in chair at end of session with all needs within reach.   3rd Session 1:1 Tx  (62 minutes) Skilled OT session completed with tx focus on dynamic standing balance, safety awareness, and endurance. Pt was received sitting up in w/c after lunch. With HHA, pt ambulated to therapy apartment and completed vacumming with cuing for safety with chord mgt and incorporation of energy conservation techniques. Then pt ambulated to dayroom in manner as written above to complete table washing. Cuing for safety when moving furniture and when attempting to stoop for items on floor. At end of tx pt returned to room and was left in w/c with all needs within reach.   Therapy Documentation Precautions:  Precautions Precautions: Fall Precaution Comments: very anxious Restrictions Weight Bearing Restrictions: No General:   Vital Signs: Therapy Vitals Temp: 98.2 F (36.8 C) Temp Source: Oral Pulse Rate: 77 Resp: 18 BP: (!) 116/48 Patient Position (if appropriate): Sitting Oxygen Therapy SpO2: 96 % O2 Device: Not Delivered Pain: Rest breaks; medicated by nursing as needed durng sessions :    See Function Navigator for Current Functional Status.  Therapy/Group: Individual Therapy  Lucyle Alumbaugh A Feliza Diven 10/11/2016, 5:54 PM

## 2016-10-12 ENCOUNTER — Inpatient Hospital Stay (HOSPITAL_COMMUNITY): Payer: Medicare Other | Admitting: Speech Pathology

## 2016-10-12 ENCOUNTER — Inpatient Hospital Stay (HOSPITAL_COMMUNITY): Payer: Medicare Other | Admitting: Occupational Therapy

## 2016-10-12 ENCOUNTER — Inpatient Hospital Stay (HOSPITAL_COMMUNITY): Payer: Medicare Other | Admitting: *Deleted

## 2016-10-12 LAB — GLUCOSE, CAPILLARY
Glucose-Capillary: 124 mg/dL — ABNORMAL HIGH (ref 65–99)
Glucose-Capillary: 92 mg/dL (ref 65–99)
Glucose-Capillary: 162 mg/dL — ABNORMAL HIGH (ref 65–99)
Glucose-Capillary: 91 mg/dL (ref 65–99)

## 2016-10-12 NOTE — Progress Notes (Signed)
Speech Language Pathology Daily Session Note  Patient Details  Name: [name] MRN: [id number] Date of Birth: 1934-06-28  Today's Date: 10/12/2016 SLP Individual Time: [id number] SLP Individual Time Calculation (min): 45 min   Short Term Goals: Week 1: SLP Short Term Goal 1 (Week 1): Patient will utilize memory strategies and external memory aids for storage and retrieval of new, daily information given supervision verbal cues.  SLP Short Term Goal 2 (Week 1): Patient will demonstrate Mod I for anticipatory awareness of safety needs and assistance required during daily tasks.  SLP Short Term Goal 3 (Week 1): Patient will demonstrate functional problem solving of mildly complex tasks given supervision verbal cues. SLP Short Term Goal 4 (Week 1): Patient will demonstrate selective attention in a minimally distracting environment given supervision verbal cues. SLP Short Term Goal 5 (Week 1): Patient will follow mildly complex multistep directions given supervision verbal cues.   Skilled Therapeutic Interventions:  Pt was seen for skilled ST targeting cognitive goals.  SLP facilitated the session with a money management task to address functional problem solving.  Pt was able to count money and generate values of coins when named for 90% accuracy with overall supervision cues for repetition of instructions; she did need up to min assist verbal cues for repetition to make change due to decreased working memory.  Discussed working memory compensatory strategies, specifically writing things down during tasks to help her keep her place when mentally manipulating multiple pieces of information.  Pt verbalized understanding.  Pt was left in wheelchair with sister at bedside at the end of today's therapy session.  Continue per current plan of care.    Function:  Eating Eating                 Cognition Comprehension Comprehension assist level: Follows basic conversation/direction with extra  time/assistive device  Expression   Expression assist level: Expresses basic needs/ideas: With extra time/assistive device  Social Interaction Social Interaction assist level: Interacts appropriately 90% of the time - Needs monitoring or encouragement for participation or interaction.  Problem Solving Problem solving assist level: Solves basic 75 - 89% of the time/requires cueing 10 - 24% of the time  Memory Memory assist level: Recognizes or recalls 75 - 89% of the time/requires cueing 10 - 24% of the time    Pain Pain Assessment Pain Assessment: No/denies pain  Therapy/Group: Individual Therapy  Jaydalee Bardwell, Selinda Orion 10/12/2016, 10:39 AM

## 2016-10-12 NOTE — Progress Notes (Signed)
Physical Therapy Session Note  Patient Details  Name: [name] MRN: [id number] Date of Birth: [birth year]  Today's Date: 10/12/2016 PT Individual Time: 0930-1100 PT Individual Time Calculation (min): 90 min      Skilled Therapeutic Interventions/Progress Updates:  Patient in w/c finished her SLP session, a little teary but agrees to therapy intervention. Session initiated with training in long distance gait ,to [facility] tower and back with one seated rest break , HHA provided, no LOB noted during activity. Patient unable to find way back to unit on her own,needs redirection.  Training in functional activities in therapy kitchen, feeding the cat. Patient performed reaching on low shelves ,taking a pot out and placing small objects on floor to imitate feeding task, x 12 with min assistance and cues for safety. Patient requested to use the bathroom-Supervision for toileting and hygiene.  NuStep training x 10 min in order to facilitate reciprocal movements and increase activity tolerance.   Trainingbin dynamic balance with obstacle course, with stepping over obstacles, foam board, stairs and picking up weighted ball. Patient needed min A for balance and safety.  At the end of session returned to room, requested to lay down, reports feeling "like she is on the sea"- but not dizzy.  Informed RN of reported sensation. All needs within reach and bed alarm on.   Therapy Documentation Precautions:  Precautions Precautions: Fall Precaution Comments: very anxious Restrictions Weight Bearing Restrictions: No   See Function Navigator for Current Functional Status.   Therapy/Group: Individual Therapy  [name] 10/12/2016, 12:05 PM

## 2016-10-12 NOTE — Progress Notes (Signed)
Occupational Therapy Session Note  Patient Details  Name: MERIWETHER SHNAYDER MRN: [id number] Date of Birth: 01/16/1934  Today's Date: 10/12/2016 OT Individual Time: 1300-1400 OT Individual Time Calculation (min): 60 min     Skilled Therapeutic Interventions/Progress Updates:   Patient exhibited mild confusion and STmemory deficits at times but was able to complete functional mobility for homemaking skills with good safety judgement and balance.    She assisted staff with changing her room to Mid 6 on the other side of the hall.     With extra time and 1 rest breaks, she was able to arrange her personal belongings and towels and washcloths where she preferred.  This patient will benefit from more opportunities to be mobile with her self care and daily activities.  She was left in the card of her sister Butch Penny who stated, "My sister benefits from moving around.  The more she can move around the better she functions and feels."  Therapy Documentation Precautions:  Precautions Precautions: Fall Precaution Comments: very anxious Restrictions Weight Bearing Restrictions: No General:   Vital Signs: Therapy Vitals Temp: 98.1 F (36.7 C) Temp Source: Oral Pulse Rate: 81 Resp: 17 BP: 134/67 Patient Position (if appropriate): Sitting Oxygen Therapy SpO2: 98 % O2 Device: Not Delivered Pain: Pain Assessment Pain Assessment: 0-10 Pain Score: 6  Pain Type: Acute pain Pain Location: Scapula Pain Orientation: Right Pain Descriptors / Indicators: Sore Pain Onset: On-going Pain Intervention(s): Medication (See eMAR)  See Function Navigator for Current Functional Status.   Therapy/Group: Individual Therapy  Alfredia Ferguson [facility] 10/12/2016, 2:42 PM

## 2016-10-12 NOTE — IPOC Note (Signed)
Overall Plan of Care [facility]) Patient Details Name: [name] MRN: [id number] DOB: 09-27-1934  Admitting Diagnosis: Debility  Hospital Problems: Principal Problem:   Debilitated Active Problems:   Essential hypertension   Anxiety state   Controlled type 2 diabetes mellitus with complication, without long-term current use of insulin ([place])   Stage 2 chronic kidney disease     Functional Problem List: Nursing Bladder, Endurance, Medication Management, Motor, Nutrition, Safety, Bowel, Behavior, Skin Integrity  PT Balance, Endurance, Safety  OT Balance, Cognition, Endurance, Pain, Safety  SLP Cognition, Safety  TR         Basic ADL's: OT Grooming, Bathing, Dressing, Toileting     Advanced  ADL's: OT Laundry, Light Housekeeping, Simple Meal Preparation     Transfers: PT Bed Mobility, Bed to Chair, Car, Manufacturing systems engineer, Metallurgist: PT Stairs, Ambulation     Additional Impairments: OT None  SLP None      TR      Anticipated Outcomes Item Anticipated Outcome  Self Feeding n/a  Swallowing      Basic self-care  supervision  Toileting  supervision   Bathroom Transfers supervision  Bowel/Bladder  mod I  Transfers  supervision  Locomotion  supervision ambulatory  Communication  Supervision  Cognition  Supervision-Mod I   Pain  n/a  Safety/Judgment  supervision   Therapy Plan: PT Intensity: Minimum of 1-2 x/day ,45 to 90 minutes PT Frequency: 5 out of 7 days PT Duration Estimated Length of Stay: 8-12 days OT Intensity: Minimum of 1-2 x/day, 45 to 90 minutes OT Frequency: 5 out of 7 days OT Duration/Estimated Length of Stay: 7-10 days SLP Intensity: Minumum of 1-2 x/day, 30 to 90 minutes SLP Frequency: 3 to 5 out of 7 days SLP Duration/Estimated Length of Stay: 7-10 days       Team Interventions: Nursing Interventions Cognitive Remediation/Compensation, Patient/Family Education, Bladder Management, Bowel Management, Disease  Management/Prevention, Medication Management, Skin Care/Wound Management, Discharge Planning, Psychosocial Support  PT interventions Ambulation/gait training, Community reintegration, DME/adaptive equipment instruction, Neuromuscular re-education, Psychosocial support, Stair training, UE/LE Strength taining/ROM, UE/LE Coordination activities, Therapeutic Activities, Discharge planning, Training and development officer, Cognitive remediation/compensation, Functional mobility training, Patient/family education, Splinting/orthotics, Therapeutic Exercise  OT Interventions Training and development officer, Cognitive remediation/compensation, Community reintegration, Discharge planning, Functional mobility training, Psychosocial support, Therapeutic Activities, UE/LE Coordination activities, Patient/family education, DME/adaptive equipment instruction, UE/LE Strength taining/ROM, Pain management, Self Care/advanced ADL retraining, Therapeutic Exercise  SLP Interventions Cognitive remediation/compensation, Cueing hierarchy, Functional tasks, Environmental controls, Internal/external aids, Patient/family education, Therapeutic Activities  TR Interventions    SW/CM Interventions Discharge Planning, Psychosocial Support, Patient/Family Education    Team Discharge Planning: Destination: PT-Home ,OT- Home , SLP-Home Projected Follow-up: PT-Outpatient PT, 24 hour supervision/assistance, OT-  24 hour supervision/assistance, SLP-24 hour supervision/assistance, Outpatient SLP, Home Health SLP Projected Equipment Needs: PT-To be determined, OT- To be determined, SLP-None recommended by SLP Equipment Details: PT- , OT-  Patient/family involved in discharge planning: PT- Patient, Family member/caregiver,  OT-Patient, Family member/caregiver, SLP-Patient, Family member/caregiver  MD ELOS: 8-12 days Medical Rehab Prognosis:  Excellent Assessment: The patient has been admitted for CIR therapies with the diagnosis of debility after  multiple medical. The team will be addressing functional mobility, strength, stamina, balance, safety, adaptive techniques and equipment, self-care, bowel and bladder mgt, patient and caregiver education, anxiety mgt, ego support, community reintegration. Goals have been set at supervision to mod I with mobility and self-care tasks.    [name], MD, Mellody Drown  See Team Conference Notes for weekly updates to the plan of care

## 2016-10-12 NOTE — Progress Notes (Signed)
PHYSICAL MEDICINE & REHABILITATION     PROGRESS NOTE    Subjective/Complaints: Rested well. No new issues. No anxiety attack yesterday that i'm aware of  ROS: Pt denies fever, rash/itching, headache, blurred or double vision, nausea, vomiting, abdominal pain, diarrhea, chest pain, shortness of breath, palpitations, dysuria, dizziness, neck pain, back pain, bleeding,   or depression   Objective: Vital Signs: Blood pressure 138/78, pulse 78, temperature 98.4 F (36.9 C), temperature source Oral, resp. rate 16, height 5' (1.524 m), weight 54.5 kg (120 lb 1.6 oz), SpO2 98 %. No results found.  Recent Labs  10/09/16 1829 10/10/16 0727  WBC 6.9 5.8  HGB 11.7* 11.2*  HCT 33.5* 33.0*  PLT 230 222    Recent Labs  10/09/16 1829 10/10/16 0727  NA  --  138  K  --  3.8  CL  --  107  GLUCOSE  --  115*  BUN  --  16  CREATININE 1.04* 0.92  CALCIUM  --  8.9   CBG (last 3)   Recent Labs  10/11/16 1642 10/11/16 2109 10/12/16 0639  GLUCAP 159* 98 124*    Wt Readings from Last 3 Encounters:  10/09/16 54.5 kg (120 lb 1.6 oz)  10/06/16 65.8 kg (145 lb)  01/25/16 65.9 kg (145 lb 4 oz)    Physical Exam:  No distress HENT:  Head: Normocephalic.  Multiple bruises to the face over left eye healing Eyes: EOMI Neck: Normal range of motion. Neck supple. No thyromegaly present.  Cardiovascular: RRR Respiratory: CTA B GI: Soft. BS+.  Musculoskeletal: She exhibits no edema or tenderness. At rest or with ROM.  Neurological: She is alert.  CN normal She is able to provide her name and her age and place. Followed simple commands Motor: 4+/5 throughout bilateral upper and lower ext. No ataxia Sensation intact to light touch in all 4's.   DTRs symmetric.  Skin: Skin is warm and dry throughout.  Psychiatric: calm and appropriate when I visited.   Assessment/Plan: 1. Functional deficits secondary to debility/rhabdomyolysis which require 3+ hours per day of  interdisciplinary therapy in a comprehensive inpatient rehab setting. Physiatrist is providing close team supervision and 24 hour management of active medical problems listed below. Physiatrist and rehab team continue to assess barriers to discharge/monitor patient progress toward functional and medical goals.  Function:  Bathing Bathing position   Position: Shower  Bathing parts Body parts bathed by patient: Right arm, Left arm, Chest, Abdomen, Front perineal area, Buttocks, Right upper leg, Left upper leg Body parts bathed by helper: Right lower leg, Left lower leg, Back  Bathing assist Assist Level: Touching or steadying assistance(Pt > 75%)      Upper Body Dressing/Undressing Upper body dressing   What is the patient wearing?: Pull over shirt/dress     Pull over shirt/dress - Perfomed by patient: Put head through opening, Thread/unthread right sleeve, Thread/unthread left sleeve, Pull shirt over trunk          Upper body assist Assist Level: Set up   Set up : To obtain clothing/put away  Lower Body Dressing/Undressing Lower body dressing   What is the patient wearing?: Underwear, Pants, Non-skid slipper socks Underwear - Performed by patient: Thread/unthread right underwear leg, Thread/unthread left underwear leg, Pull underwear up/down   Pants- Performed by patient: Thread/unthread right pants leg, Thread/unthread left pants leg, Pull pants up/down   Non-skid slipper socks- Performed by patient: Don/doff right sock, Don/doff left sock  Lower body assist Assist for lower body dressing: Touching or steadying assistance (Pt > 75%)      Toileting Toileting   Toileting steps completed by patient: Adjust clothing prior to toileting, Performs perineal hygiene, Adjust clothing after toileting   Toileting Assistive Devices: Grab bar or rail  Toileting assist Assist level: Touching or steadying assistance (Pt.75%)   Transfers Chair/bed transfer    Chair/bed transfer method: Ambulatory Chair/bed transfer assist level: Touching or steadying assistance (Pt > 75%) Chair/bed transfer assistive device: Armrests     Locomotion Ambulation     Max distance: 250 Assist level: Touching or steadying assistance (Pt > 75%)   Wheelchair Wheelchair activity did not occur: N/A (pt ambulatory on unit)        Cognition Comprehension Comprehension assist level: Understands basic 90% of the time/cues < 10% of the time  Expression Expression assist level: Expresses basic needs/ideas: With extra time/assistive device  Social Interaction Social Interaction assist level: Interacts appropriately with others with medication or extra time (anti-anxiety, antidepressant).  Problem Solving Problem solving assist level: Solves basic 75 - 89% of the time/requires cueing 10 - 24% of the time  Memory Memory assist level: Recognizes or recalls 50 - 74% of the time/requires cueing 25 - 49% of the time   Medical Problem List and Plan: 1.  Debilitation secondary to rhabdomyolysis/multi-medical  -continue CIR therapies 2.  DVT Prophylaxis/Anticoagulation: Subcutaneous Lovenox. Monitor platelet counts and any signs of bleeding 3. Pain Management: Tylenol as needed--appears controlled 4. Mood: Xanax 0.25 mg 3 times a day as needed  -continue scheduled xanax 0.25mg  TID as she was taking at home  -?increasing depression/anxiety/cognitive decline prior to this admit?  -  neuropsych evaluation pending  -team providing ego support as necessary 5. Neuropsych: This patient is not fully capable of making decisions on her own behalf. 6. Skin/Wound Care: encourage adequate nutrition 7. Fluids/Electrolytes/Nutrition: I personally reviewed all of the patient's labs today, and lab work is within normal limits.  8. Hypertension. Plendil 5 mg daily, Cozaar 100 mg daily.   -fair control at present 9. Diabetes mellitus. Hemoglobin A1c 6.2. Check blood sugars before meals and at  bedtime.SSI. Patient on Glucophage 750 mg daily prior to admission.  -good control at present  -intake still somewhat inconsistent  -Resume glucophage as needed 10. Hypothyroidism. Synthroid. Latest TSH 1.510 11. Hyperlipidemia. Lipitor 12. History of left breast cancer. 13. ABLA:  11.2--stable 14. CKD: Cr 0.9  LOS (Days) 3 A FACE TO FACE EVALUATION WAS PERFORMED  [name] T 10/12/2016 9:58 AM

## 2016-10-13 LAB — GLUCOSE, CAPILLARY
GLUCOSE-CAPILLARY: 117 mg/dL — AB (ref 65–99)
Glucose-Capillary: 102 mg/dL — ABNORMAL HIGH (ref 65–99)
Glucose-Capillary: 109 mg/dL — ABNORMAL HIGH (ref 65–99)
Glucose-Capillary: 121 mg/dL — ABNORMAL HIGH (ref 65–99)

## 2016-10-13 MED ORDER — POLYETHYLENE GLYCOL 3350 17 G PO PACK
17.0000 g | PACK | Freq: Once | ORAL | Status: DC
  Filled 2016-10-13 (×2): qty 1

## 2016-10-13 NOTE — Progress Notes (Signed)
[place] PHYSICAL MEDICINE & REHABILITATION     PROGRESS NOTE    Subjective/Complaints: Woke up with nausea. Hasn't had bm for 3 days. Otherwise has been doing well.   ROS: Pt denies fever, rash/itching, headache, blurred or double vision,  vomiting, abdominal pain, diarrhea, chest pain, shortness of breath, palpitations, dysuria, dizziness, neck pain, back pain, bleeding, anxiety, or depression   Objective: Vital Signs: Blood pressure (!) 155/63, pulse 64, temperature 97.5 F (36.4 C), temperature source Oral, resp. rate 16, height 5' (1.524 m), weight 54.5 kg (120 lb 1.6 oz), SpO2 97 %. No results found. No results for input(s): WBC, HGB, HCT, PLT in the last 72 hours. No results for input(s): NA, K, CL, GLUCOSE, BUN, CREATININE, CALCIUM in the last 72 hours.  Invalid input(s): CO CBG (last 3)   Recent Labs  10/12/16 1629 10/12/16 2042 10/13/16 0643  GLUCAP 162* 91 117*    Wt Readings from Last 3 Encounters:  10/09/16 54.5 kg (120 lb 1.6 oz)  10/06/16 65.8 kg (145 lb)  01/25/16 65.9 kg (145 lb 4 oz)    Physical Exam:  No distress HENT:  Head:  NCAT Multiple bruises to the face over left eye healing Eyes: EOMI Neck: Normal range of motion. Neck supple. No thyromegaly present.  Cardiovascular: RRR Respiratory: CTA B GI: Soft. BS+, NT,ND.  Musculoskeletal: She exhibits no edema or tenderness. At rest or with ROM.  Neurological: She is alert.  CN remain normal She is able to provide her name and her age and place. Reasonable insight and awareness Motor: 4+/5 throughout bilateral upper and lower ext. No ataxia Sensation intact to light touch in all 4's.   DTRs symmetric.  Skin: intact.  Psychiatric: calm and relaxed   Assessment/Plan: 1. Functional deficits secondary to debility/rhabdomyolysis which require 3+ hours per day of interdisciplinary therapy in a comprehensive inpatient rehab setting. Physiatrist is providing close team supervision and 24 hour  management of active medical problems listed below. Physiatrist and rehab team continue to assess barriers to discharge/monitor patient progress toward functional and medical goals.  Function:  Bathing Bathing position   Position: Shower  Bathing parts Body parts bathed by patient: Right arm, Left arm, Chest, Abdomen, Front perineal area, Buttocks, Right upper leg, Left upper leg Body parts bathed by helper: Right lower leg, Left lower leg, Back  Bathing assist Assist Level: Touching or steadying assistance(Pt > 75%)      Upper Body Dressing/Undressing Upper body dressing   What is the patient wearing?: Pull over shirt/dress     Pull over shirt/dress - Perfomed by patient: Put head through opening, Thread/unthread right sleeve, Thread/unthread left sleeve, Pull shirt over trunk          Upper body assist Assist Level: Set up   Set up : To obtain clothing/put away  Lower Body Dressing/Undressing Lower body dressing   What is the patient wearing?: Underwear, Pants, Non-skid slipper socks Underwear - Performed by patient: Thread/unthread right underwear leg, Thread/unthread left underwear leg, Pull underwear up/down   Pants- Performed by patient: Thread/unthread right pants leg, Thread/unthread left pants leg, Pull pants up/down   Non-skid slipper socks- Performed by patient: Don/doff right sock, Don/doff left sock                    Lower body assist Assist for lower body dressing: Touching or steadying assistance (Pt > 75%)      Toileting Toileting   Toileting steps completed by patient: Adjust clothing  prior to toileting, Performs perineal hygiene, Adjust clothing after toileting   Toileting Assistive Devices: Grab bar or rail  Toileting assist Assist level: Supervision or verbal cues   Transfers Chair/bed transfer   Chair/bed transfer method: Ambulatory Chair/bed transfer assist level: Touching or steadying assistance (Pt > 75%) Chair/bed transfer assistive  device: Armrests     Locomotion Ambulation     Max distance: 250 Assist level: Touching or steadying assistance (Pt > 75%)   Wheelchair Wheelchair activity did not occur: N/A (pt ambulatory on unit)        Cognition Comprehension Comprehension assist level: Follows basic conversation/direction with extra time/assistive device  Expression Expression assist level: Expresses basic needs/ideas: With extra time/assistive device  Social Interaction Social Interaction assist level: Interacts appropriately 90% of the time - Needs monitoring or encouragement for participation or interaction.  Problem Solving Problem solving assist level: Solves basic 75 - 89% of the time/requires cueing 10 - 24% of the time  Memory Memory assist level: Recognizes or recalls 75 - 89% of the time/requires cueing 10 - 24% of the time   Medical Problem List and Plan: 1.  Debilitation secondary to rhabdomyolysis/multi-medical  -continue CIR therapies PT,OT,SLP 2.  DVT Prophylaxis/Anticoagulation: Subcutaneous Lovenox. Monitor platelet counts and any signs of bleeding 3. Pain Management: Tylenol as needed--appears controlled 4. Mood: Xanax 0.25 mg 3 times a day as needed  -continue scheduled xanax 0.25mg  TID as she was taking at home---this seems to have helped.  -?increasing depression/anxiety/cognitive decline prior to this admit?  -  neuropsych evaluation pending  -team providing ego support as necessary 5. Neuropsych: This patient is not fully capable of making decisions on her own behalf. 6. Skin/Wound Care: encourage adequate nutrition 7. Fluids/Electrolytes/Nutrition: encourage PO  8. Hypertension. Plendil 5 mg daily, Cozaar 100 mg daily.   -reasonable control at present 9. Diabetes mellitus. Hemoglobin A1c 6.2. Check blood sugars before meals and at bedtime.SSI. Patient on Glucophage 750 mg daily prior to admission.  -tight control at present  -intake still somewhat inconsistent  -Resume glucophage as  needed 10. Hypothyroidism. Synthroid. Latest TSH 1.510 11. Hyperlipidemia. Lipitor 12. History of left breast cancer. 13. ABLA:  11.2--stable 14. CKD: Cr 0.9 15. Nausea: zofran prn  -work on BM today  LOS (Days) 4 A FACE TO [place] PERFORMED  Bailey Kolbe T 10/13/2016 8:07 AM

## 2016-10-13 NOTE — Progress Notes (Signed)
Complained of nausea, PRN zofran given at 0441. Continued to complain of nausea tis morning with lots of belching. [name] A

## 2016-10-13 NOTE — Progress Notes (Signed)
Social Work  Social Work Assessment and Plan  Patient Details  Name: [name] MRN: [id number] Date of Birth: [birth year]  Today's Date: 10/11/2016  Problem List:  Patient Active Problem List   Diagnosis Date Noted  . Debilitated 10/09/2016  . Transient cerebral ischemia   . Anxiety state   . Controlled type 2 diabetes mellitus with complication, without long-term current use of insulin ([place])   . History of breast cancer   . Acute blood loss anemia   . Stage 2 chronic kidney disease   . Syncope 10/07/2016  . Stroke ([place]) 10/06/2016  . Stroke (cerebrum) ([place]) 10/06/2016  . Aphasia 10/06/2016  . Leukocytosis 10/06/2016  . Essential hypertension 10/06/2016  . Hypothyroidism 10/06/2016  . GERD (gastroesophageal reflux disease) 10/06/2016  . Hyperlipemia 10/06/2016   Past Medical History:  Past Medical History:  Diagnosis Date  . Arthritis   . Cancer [facility])    breast cancer on left - 10  years ago  . Diabetes mellitus without complication ([place])   . GERD (gastroesophageal reflux disease)   . Hypertension   . Hypothyroidism   . Stroke ([place])    2014  . Wears glasses    Past Surgical History:  Past Surgical History:  Procedure Laterality Date  . ABDOMINAL HYSTERECTOMY    . APPENDECTOMY    . ARCUATE KERATECTOMY    . BUNIONECTOMY     right  . CARPAL TUNNEL RELEASE     right  . CARPAL TUNNEL RELEASE Left 05/24/2014   Procedure: LEFT CARPAL TUNNEL RELEASE LEFT THUMB TRIGGER FINGER RELEASE;  Surgeon: Cammie Sickle, MD;  Location: [facility];  Service: Orthopedics;  Laterality: Left;  left thumb also site of incision  . DEBRIDEMENT OF ABDOMINAL WALL ABSCESS  1978  . OVARIAN CYST SURGERY     x2-  . TONSILLECTOMY    . TRIGGER FINGER RELEASE Left 01/25/2016   Procedure: RELEASE A-1 PULLEY LEFT RING FINGER, LEFT SMALL FINGER;  Surgeon: Daryll Brod, MD;  Location: [facility];  Service: Orthopedics;  Laterality: Left;  ANESTHESIA: IV REGIONAL  FAB   Social History:  reports that she has quit smoking. Her smoking use included Cigarettes. She has never used smokeless tobacco. She reports that she drinks alcohol. She reports that she does not use drugs.  Family / Support Systems Marital Status: Married Patient Roles: Spouse, Other (Comment) (sister) Spouse/Significant Other: (49rd) husband, Nadara Mustard, suffers with dementia and is an ALF with no plans for return home Children: Pt had one son who drowned at age 71.  Of note, when I had questioned about children, pt began to cry heavily and sister had to explain. Other Supports: sister, Army Chaco @ [id number] and her spouse, Herbie [place] @ [id number];  neice, Darolyn Rua Mast also local and very supportive.  Sister also notes that pt's neighbors are extremely helpful.  Anticipated Caregiver: sister, niece and neighbors Ability/Limitations of Caregiver: Sister works but lives [address] mins a way. Niece 12 mins away.  Supportive neighbors. Caregiver Availability: Intermittent Family Dynamics: Sister reports that she and pt are very close and sister has tried to support pt in her independence.   Social History Preferred language: English Religion: Protestant Cultural Background: NA Education: HS Read: Yes Write: Yes Employment Status: Retired Freight forwarder Issues: None Guardian/Conservator: Pt's sister, Butch Penny, is her 43 and [place].   Abuse/Neglect Physical Abuse: Denies Verbal Abuse: Denies Sexual Abuse: Denies Exploitation of patient/patient's resources: Denies Self-Neglect: Denies  Emotional Status Pt's  affect, behavior adn adjustment status: Pt tearful intermittently thoughout interview.  With some questions, pt asks sister to provide the answer.  She admits much frustration with her situation mostly because she has no recollection of "what happened that day!Marland KitchenMarland KitchenMarland KitchenNothing at all!"  She also expresses concern about whether she can regain her independence.  Per other staff, pt has been  tearful often about her situation and have referred for neuropsychology to follow up on Monday. Recent Psychosocial Issues: After caring for her spouse ~15 yrs with dementia, she recently placed him in an ALF. Pyschiatric History: Pt admits to anxiety especially with her caregiving stressors.  She does not offer much more about her emotioanal adjustment.  ST comes to room for pt and pt asked sister to continue to offer information on her behalf.  Sister reports that pt has been very "depressed" during her caregiving for spouse and now with an increased tearfulness and has made comments about not wanting to have survived this health crisis.  Sister very eager for psychology to consult with pt and I have scheduled for her to be seen on monday.  Sister does not feel pt is suicidal but definitely very emotionally distressed. Substance Abuse History: None  Patient / Family Perceptions, Expectations & Goals Pt/Family understanding of illness & functional limitations: Pt states, "I have no idea what happened or why I'm here..."  Sister with a general understanding and feels pt likely with a GI issue and medication caused her to fall.  Both understand her current debility and need for CIR. Premorbid pt/family roles/activities: Pt was completely independent PTA Anticipated changes in roles/activities/participation: Sister may need to increase her support dependent on functional gains made. Pt/family expectations/goals: Pt and sister hopeful she can reach a level that close, intermittent visits from family and neighbors will be enough assistance.  Community Resources Express Scripts: None Premorbid Home Care/DME Agencies: None Transportation available at discharge: yes Resource referrals recommended: Neuropsychology  Discharge Planning Living Arrangements: Alone Support Systems: Other relatives, Friends/neighbors Type of Residence: Private residence Insurance Resources: [employer], Multimedia programmer  (specify) Web designer) Financial Resources: [place] Referred: No Living Expenses: Own Money Management: Patient Does the patient have any problems obtaining your medications?: No Home Management: pt Patient/Family Preliminary Plans: Pt to return to her own condo with family and friends providing close, intermittent support. Barriers to Discharge: Family Support (Not really able to provide 24/7 is that is level of assist needed at d/c.) Social Work Anticipated Follow Up Needs: HH/OP Expected length of stay: 7-12 days  Clinical Impression Elderly woman here following a fall at home with extended time down before sister arrived and got medical attention.  Weakened and has no recall of events around her fall which is very frustrating for her.  Tearful throughout assessment interview and sister reports that she has expressed wishes that she had not survived this.  Sister concerned about pt's emotional distress and have already referred for neuropsychology support.  Anticipating supervision goals bot pt and sister hopeful she may reach a level that consistent, intermittent support will suffice.    Alden Feagan 10/11/2016, 12:43 PM

## 2016-10-13 NOTE — Care Management Note (Signed)
[facility] Individual Statement of Services  Patient Name:  [name]  Date:  10/11/2016  Welcome to the [facility].  Our goal is to provide you with an individualized program based on your diagnosis and situation, designed to meet your specific needs.  With this comprehensive rehabilitation program, you will be expected to participate in at least 3 hours of rehabilitation therapies Monday-Friday, with modified therapy programming on the weekends.  Your rehabilitation program will include the following services:  Physical Therapy (PT), Occupational Therapy (OT), Speech Therapy (ST), 24 hour per day rehabilitation nursing, Therapeutic Recreaction (TR), Neuropsychology, Case Management (Social Worker), Rehabilitation Medicine, Nutrition Services and Pharmacy Services  Weekly team conferences will be held on Wednesdays to discuss your progress.  Your Social Worker will talk with you frequently to get your input and to update you on team discussions.  Team conferences with you and your family in attendance may also be held.  Expected length of stay: 7-12 days  Overall anticipated outcome: supervision  Depending on your progress and recovery, your program may change. Your Social Worker will coordinate services and will keep you informed of any changes. Your Social Worker's name and contact numbers are listed  below.  The following services may also be recommended but are not provided by the [facility] will be made to provide these services after discharge if needed.  Arrangements include referral to agencies that provide these services.  Your insurance has been verified to be:  Medicare and [place] Your primary doctor is:  Radio broadcast assistant  Pertinent information will be shared with your doctor and your insurance  company.  Social Worker:  [place], [place] or [phone]   Information discussed with and copy given to patient by: Lennart Pall, 10/11/2016, 12:59 PM

## 2016-10-14 ENCOUNTER — Inpatient Hospital Stay (HOSPITAL_COMMUNITY): Payer: Medicare Other | Admitting: Physical Therapy

## 2016-10-14 ENCOUNTER — Inpatient Hospital Stay (HOSPITAL_COMMUNITY): Payer: Medicare Other | Admitting: Occupational Therapy

## 2016-10-14 ENCOUNTER — Encounter (HOSPITAL_COMMUNITY): Payer: Medicare Other

## 2016-10-14 ENCOUNTER — Inpatient Hospital Stay (HOSPITAL_COMMUNITY): Payer: Medicare Other | Admitting: Speech Pathology

## 2016-10-14 DIAGNOSIS — D62 Acute posthemorrhagic anemia: Secondary | ICD-10-CM

## 2016-10-14 DIAGNOSIS — F329 Major depressive disorder, single episode, unspecified: Secondary | ICD-10-CM

## 2016-10-14 DIAGNOSIS — F332 Major depressive disorder, recurrent severe without psychotic features: Secondary | ICD-10-CM

## 2016-10-14 LAB — GLUCOSE, CAPILLARY
GLUCOSE-CAPILLARY: 111 mg/dL — AB (ref 65–99)
Glucose-Capillary: 155 mg/dL — ABNORMAL HIGH (ref 65–99)
Glucose-Capillary: 159 mg/dL — ABNORMAL HIGH (ref 65–99)
Glucose-Capillary: 87 mg/dL (ref 65–99)

## 2016-10-14 MED ORDER — FLUOXETINE HCL 10 MG PO CAPS
10.0000 mg | ORAL_CAPSULE | Freq: Every day | ORAL | Status: DC
  Administered 2016-10-14 – 2016-10-16 (×3): 10 mg via ORAL
  Filled 2016-10-14 (×3): qty 1

## 2016-10-14 NOTE — Progress Notes (Signed)
Speech Language Pathology Daily Session Note  Patient Details  Name: [name] MRN: [id number] Date of Birth: 1934-01-23  Today's Date: 10/14/2016 SLP Individual Time: 0900-1000 SLP Individual Time Calculation (min): 60 min   Short Term Goals: Week 1: SLP Short Term Goal 1 (Week 1): Patient will utilize memory strategies and external memory aids for storage and retrieval of new, daily information given supervision verbal cues.  SLP Short Term Goal 2 (Week 1): Patient will demonstrate Mod I for anticipatory awareness of safety needs and assistance required during daily tasks.  SLP Short Term Goal 3 (Week 1): Patient will demonstrate functional problem solving of mildly complex tasks given supervision verbal cues. SLP Short Term Goal 4 (Week 1): Patient will demonstrate selective attention in a minimally distracting environment given supervision verbal cues. SLP Short Term Goal 5 (Week 1): Patient will follow mildly complex multistep directions given supervision verbal cues.   Skilled Therapeutic Interventions:Skilled therapy intervention focused on cognitive goals. Patient requested the opportunity to try the basic money management task from a previous session once again; she completed the task given supervision verbal cues for problem solving.When presented with a mildly complex functional shopping task, patient required Min A verbal cues for recall of instructions, fading to supervision. Patient handed off to OT. Continue current plan of care.    Function:  Cognition Comprehension Comprehension assist level: Follows basic conversation/direction with no assist  Expression   Expression assist level: Expresses basic needs/ideas: With no assist  Social Interaction Social Interaction assist level: Interacts appropriately with others with medication or extra time (anti-anxiety, antidepressant).  Problem Solving Problem solving assist level: Solves basic 90% of the time/requires cueing < 10%  of the time  Memory Memory assist level: Recognizes or recalls 75 - 89% of the time/requires cueing 10 - 24% of the time    Pain Pain Assessment Pain Assessment: No/denies pain  Therapy/Group: Individual Therapy  Thornton Papas 10/14/2016, 3:35 PM

## 2016-10-14 NOTE — Progress Notes (Signed)
[place] PHYSICAL MEDICINE & REHABILITATION     PROGRESS NOTE    Subjective/Complaints: Pt seen sitting up in her chair working with Neuropsych this AM.  Sister present.  She slept well overnight.   ROS: Denies CP, SOB, N/V/D.  Objective: Vital Signs: Blood pressure (!) 122/59, pulse 71, temperature 98.1 F (36.7 C), temperature source Oral, resp. rate 18, height 5' (1.524 m), weight 54.5 kg (120 lb 1.6 oz), SpO2 99 %. No results found. No results for input(s): WBC, HGB, HCT, PLT in the last 72 hours. No results for input(s): NA, K, CL, GLUCOSE, BUN, CREATININE, CALCIUM in the last 72 hours.  Invalid input(s): CO CBG (last 3)   Recent Labs  10/13/16 1658 10/13/16 2126 10/14/16 0649  GLUCAP 109* 121* 111*    Wt Readings from Last 3 Encounters:  10/09/16 54.5 kg (120 lb 1.6 oz)  10/06/16 65.8 kg (145 lb)  01/25/16 65.9 kg (145 lb 4 oz)    Physical Exam:  Gen: No distress. Vital signs reviewed. HENT: [place]. Bruises to the face over left eye healing Eyes: EOMI. No discharge. Cardiovascular: RRR. No JVD. Respiratory: CTA B GI: Soft. BS+, NT,ND.  Musculoskeletal: She exhibits no edema or tenderness.  Neurological: She is alert and oriented Motor: 4+-5/5 throughout bilateral upper and lower ext.  Skin: intact. Bruises healing Psychiatric: Normal affect. Normal behavior.  Assessment/Plan: 1. Functional deficits secondary to debility/rhabdomyolysis which require 3+ hours per day of interdisciplinary therapy in a comprehensive inpatient rehab setting. Physiatrist is providing close team supervision and 24 hour management of active medical problems listed below. Physiatrist and rehab team continue to assess barriers to discharge/monitor patient progress toward functional and medical goals.  Function:  Bathing Bathing position   Position: Shower  Bathing parts Body parts bathed by patient: Right arm, Left arm, Chest, Abdomen, Front perineal area, Buttocks, Right upper  leg, Left upper leg Body parts bathed by helper: Right lower leg, Left lower leg, Back  Bathing assist Assist Level: Touching or steadying assistance(Pt > 75%)      Upper Body Dressing/Undressing Upper body dressing   What is the patient wearing?: Pull over shirt/dress     Pull over shirt/dress - Perfomed by patient: Put head through opening, Thread/unthread right sleeve, Thread/unthread left sleeve, Pull shirt over trunk          Upper body assist Assist Level: Set up   Set up : To obtain clothing/put away  Lower Body Dressing/Undressing Lower body dressing   What is the patient wearing?: Underwear, Pants, Non-skid slipper socks Underwear - Performed by patient: Thread/unthread right underwear leg, Thread/unthread left underwear leg, Pull underwear up/down   Pants- Performed by patient: Thread/unthread right pants leg, Thread/unthread left pants leg, Pull pants up/down   Non-skid slipper socks- Performed by patient: Don/doff right sock, Don/doff left sock                    Lower body assist Assist for lower body dressing: Touching or steadying assistance (Pt > 75%)      Toileting Toileting   Toileting steps completed by patient: Adjust clothing prior to toileting, Performs perineal hygiene, Adjust clothing after toileting   Toileting Assistive Devices: Grab bar or rail  Toileting assist Assist level: Supervision or verbal cues   Transfers Chair/bed transfer   Chair/bed transfer method: Ambulatory Chair/bed transfer assist level: Touching or steadying assistance (Pt > 75%) Chair/bed transfer assistive device: Armrests     Locomotion Ambulation  Max distance: 250 Assist level: Touching or steadying assistance (Pt > 75%)   Wheelchair Wheelchair activity did not occur: N/A (pt ambulatory on unit)        Cognition Comprehension Comprehension assist level: Follows basic conversation/direction with extra time/assistive device  Expression Expression assist  level: Expresses basic needs/ideas: With extra time/assistive device  Social Interaction Social Interaction assist level: Interacts appropriately 90% of the time - Needs monitoring or encouragement for participation or interaction.  Problem Solving Problem solving assist level: Solves basic 75 - 89% of the time/requires cueing 10 - 24% of the time  Memory Memory assist level: Recognizes or recalls 75 - 89% of the time/requires cueing 10 - 24% of the time   Medical Problem List and Plan: 1.  Debilitation secondary to rhabdomyolysis/multi-medical  -continue CIR  2.  DVT Prophylaxis/Anticoagulation: Subcutaneous Lovenox. Monitor platelet counts and any signs of bleeding 3. Pain Management: Tylenol as needed, controlled at present 4. Mood: Xanax 0.25 mg 3 times a day as needed  -continue scheduled xanax 0.25mg  TID as she was taking at home---this seems to have helped.  -team providing ego support as necessary  -Will start pt on Fluoxetine after discussion with Neuropsych 5. Neuropsych: This patient is capable of making decisions on her own behalf. 6. Skin/Wound Care: encourage adequate nutrition 7. Fluids/Electrolytes/Nutrition: encourage PO 8. Hypertension. Plendil 5 mg daily, Cozaar 100 mg daily.   -Controlled on 11/20 9. Diabetes mellitus. Hemoglobin A1c 6.2. Check blood sugars before meals and at bedtime.SSI. Patient on Glucophage 750 mg daily prior to admission.    Intake still somewhat inconsistent  Resume glucophage as needed  Relatively controlled on 11/20 10. Hypothyroidism. Synthroid. Latest TSH 1.510 11. Hyperlipidemia. Lipitor 12. History of left breast cancer. 13. ABLA:    11.2 11/16  Labs ordered for tomorrow 14. ?CKD: Cr 0.92 on 11/16   Labs ordered for tomorrow 15. Nausea: zofran prn  -Improved  LOS (Days) 5 A FACE TO FACE EVALUATION WAS PERFORMED  Kirtan Sada Lorie Phenix 10/14/2016 9:18 AM

## 2016-10-14 NOTE — Progress Notes (Signed)
Occupational Therapy Session Note  Patient Details  Name: [name] MRN: [id number] Date of Birth: 09/02/1934  Today's Date: 10/14/2016 OT Individual Time: 1005-1100 OT Individual Time Calculation (min): 55 min     Short Term Goals: Week 1:  OT Short Term Goal 1 (Week 1): STGs=LTGs secondary to estimated short LOS  Skilled Therapeutic Interventions/Progress Updates:     Upon entering the room,pt seated on EOB with SLP present in room. Pt transitioned easily to OT session. Pt requesting to take shower this session. Pt ambulating without use of AD and supervision overall to obtain needed items. Pt performed shower transfer with supervision and standing throughout remainder of shower with use of grab bar to steady self. Pt sitting on EOB to finish drying self for safety. Pt needing min cues for memory as pt looked at therapist and said, " What do I do next? I don't remember what I was doing." Pt donning clothing with sit <>stand from EOB and then ambulates 150' with close supervision and without use of AD. Pt obtaining needed items from office and returning to room. Pt seated in wheelchair and propelling feet to sit at sink in order to complete grooming tasks safely.   Therapy Documentation Precautions:  Precautions Precautions: Fall Precaution Comments: very anxious Restrictions Weight Bearing Restrictions: No  See Function Navigator for Current Functional Status.   Therapy/Group: Individual Therapy  Gypsy Decant 10/14/2016, 12:10 PM

## 2016-10-14 NOTE — Progress Notes (Signed)
Physical Therapy Session Note  Patient Details  Name: [name] MRN: [id number] Date of Birth: 1934-09-11  Today's Date: 10/14/2016 PT Individual Time: 1420-1530 PT Individual Time Calculation (min): 70 min    Short Term Goals: Week 1:  PT Short Term Goal 1 (Week 1): =LTGs due to ELOS  Skilled Therapeutic Interventions/Progress Updates:  Pt received in w/c reporting pain in buttocks; pt noted to be sitting on blue waffle cushion but air noted to be deflated from it.  Obtained foam cushion for pt for improved pressure relief and comfort during sitting in w/c during day.  Pt requesting to use toilet.  Performed ambulation to/from toilet and all toileting tasks with supervision.  Pt engaged in higher level cognitive, memory activity of making gift list for great-niece (3 gifts) and recall of gifts and locating specific gifts in gift shop with 25% cues to recall each item and locate in shop as well as navigation and use of signs and environment cues to find gift shop and return to rehab with 75% cues and supervision with intermittent imbalance with changes in direction.  Performed UE and LE strengthening and endurance training on Nustep at level 5 x 10:15 at moderate intensity.  Performed higher level community gait training with obstacle course without and then with cognitive activity (animal naming activity) to focus on alternating attention while stepping over low obstacles, taller obstacles, weaving L and R around cones and over compliant surface with upright gaze.  Required supervision and verbal cues to alternate between obstacles due to pt attempting to walk around blocks and sticks on floor instead of stepping over them.  Discussed possible D/C on Wednesday, f/u therapy and recommendations for supervision and safety.  Pt reports she and her sister are still working out the details.  At end of session pt returned to room and left in w/c with all items within reach.  Therapy  Documentation Precautions:  Precautions Precautions: Fall Precaution Comments: very anxious Restrictions Weight Bearing Restrictions: No Vital Signs: Therapy Vitals Temp: 98.3 F (36.8 C) Temp Source: Oral Pulse Rate: 81 Resp: 16 BP: 98/67 Patient Position (if appropriate): Sitting Oxygen Therapy SpO2: 99 % O2 Device: Not Delivered Pain: Pain Assessment Pain Assessment: No/denies pain   See Function Navigator for Current Functional Status.   Therapy/Group: Individual Therapy  [name] [facility] 10/14/2016, 4:33 PM

## 2016-10-14 NOTE — Progress Notes (Signed)
Slept good during night, after scheduled xanax and PRN tylenol.  BM yesterday. No further complaint of nausea. [name] A

## 2016-10-14 NOTE — Progress Notes (Signed)
Social Work Patient ID: [name], female   DOB: 1934-09-14, 82 y.o.   MRN: [id number]   Alerted by treatment team that they feel pt will be ready for d/c on Wed 11/22.  Dr. Posey Pronto and in agreement.  Discussed with pt who is "thrilled" about d/c and have left a message for her sister.  Team does recommend that pt have 24/7 supervision initially due to memory issues. Awaiting response from sister about how this might be provided.  Nova Evett, LCSW

## 2016-10-15 ENCOUNTER — Inpatient Hospital Stay (HOSPITAL_COMMUNITY): Payer: Medicare Other | Admitting: Physical Therapy

## 2016-10-15 ENCOUNTER — Inpatient Hospital Stay (HOSPITAL_COMMUNITY): Payer: Medicare Other | Admitting: Occupational Therapy

## 2016-10-15 ENCOUNTER — Inpatient Hospital Stay (HOSPITAL_COMMUNITY): Payer: Medicare Other | Admitting: Speech Pathology

## 2016-10-15 DIAGNOSIS — F332 Major depressive disorder, recurrent severe without psychotic features: Secondary | ICD-10-CM

## 2016-10-15 LAB — CBC WITH DIFFERENTIAL/PLATELET
BASOS PCT: 1 %
Basophils Absolute: 0 10*3/uL (ref 0.0–0.1)
EOS ABS: 0.3 10*3/uL (ref 0.0–0.7)
Eosinophils Relative: 6 %
HEMATOCRIT: 34.8 % — AB (ref 36.0–46.0)
Hemoglobin: 11.7 g/dL — ABNORMAL LOW (ref 12.0–15.0)
Lymphocytes Relative: 31 %
Lymphs Abs: 1.6 10*3/uL (ref 0.7–4.0)
MCH: 32.5 pg (ref 26.0–34.0)
MCHC: 33.6 g/dL (ref 30.0–36.0)
MCV: 96.7 fL (ref 78.0–100.0)
MONO ABS: 0.5 10*3/uL (ref 0.1–1.0)
MONOS PCT: 9 %
Neutro Abs: 2.8 10*3/uL (ref 1.7–7.7)
Neutrophils Relative %: 53 %
Platelets: 261 10*3/uL (ref 150–400)
RBC: 3.6 MIL/uL — ABNORMAL LOW (ref 3.87–5.11)
RDW: 12.6 % (ref 11.5–15.5)
WBC: 5.2 10*3/uL (ref 4.0–10.5)

## 2016-10-15 LAB — BASIC METABOLIC PANEL
Anion gap: 9 (ref 5–15)
BUN: 14 mg/dL (ref 6–20)
CALCIUM: 9.6 mg/dL (ref 8.9–10.3)
CO2: 27 mmol/L (ref 22–32)
CREATININE: 0.99 mg/dL (ref 0.44–1.00)
Chloride: 104 mmol/L (ref 101–111)
GFR calc Af Amer: >60 mL/min (ref >60)
GFR calc non Af Amer: 52 mL/min — ABNORMAL LOW (ref >60)
GLUCOSE: 111 mg/dL — AB (ref 65–99)
Potassium: 4.3 mmol/L (ref 3.5–5.1)
Sodium: 140 mmol/L (ref 135–145)

## 2016-10-15 LAB — GLUCOSE, CAPILLARY
GLUCOSE-CAPILLARY: 106 mg/dL — AB (ref 65–99)
GLUCOSE-CAPILLARY: 153 mg/dL — AB (ref 65–99)
Glucose-Capillary: 162 mg/dL — ABNORMAL HIGH (ref 65–99)
Glucose-Capillary: 100 mg/dL — ABNORMAL HIGH (ref 65–99)

## 2016-10-15 NOTE — Progress Notes (Signed)
Social Work Patient ID: [name], female   DOB: 12/20/1933, 82 y.o.   MRN: [id number]  Able to speak with sister late yesterday afternoon.  She is aware and agreeable with d/c tomorrow and confirms that family and friends will provide 24/7 supervision as long as needed.  Hopeful this will not be a long term need but they remain very involved and supportive.  Arranging OP tx follow up.  Shainna Faux, LCSW

## 2016-10-15 NOTE — Progress Notes (Signed)
Physical Therapy Discharge Summary  Patient Details  Name: [name] MRN: [id number] Date of Birth: [birth year]  Today's Date: 10/15/2016 PT Individual Time: 1420-1535 PT Individual Time Calculation (min): 75 min   Patient has met 9 of 9 long term goals due to improved activity tolerance, improved balance, improved postural control, increased strength, ability to compensate for deficits and improved attention.  Patient to discharge at an ambulatory level Supervision.   Patient's care partner is independent to provide the necessary cognitive assistance at discharge.  Reasons goals not met: All goals met  Recommendation:  Patient will benefit from ongoing skilled PT services in outpatient setting to continue to advance safe functional mobility, address ongoing impairments in LE and core strength, cognition, activity tolerance/endurane, postural control, balance, gait, and minimize fall risk.  Equipment: No equipment provided  Reasons for discharge: treatment goals met and discharge from hospital  Patient/family agrees with progress made and goals achieved: Yes  PT Discharge Pt received seated in w/c; pt excited but also nervous about D/C home tomorrow.  Pt's sister present and re-assured her that she would be present to assist and supervise.  Pt participated in therapy session with focus on re-assessment of LE strength, sensation, car transfers, gait over level and compliant surface, balance re-assessment, stair negotiation and floor transfer all with supervision as documented below.  Pt reports PTA she would do a lot of gardening and yard work.  While in floor pt performed quadruped alternating LE extension for hip and core strengthening as well as tall kneeling squats x 10 to simulate yard work movements and provide proximal hip and muscle strengthening.  Continued dynamic balance and gait training in hallway x 300' with vertical and horizontal head turns, heel walking, toe walking,  tandem gait, retro gait, lateral stepping to L and R with supervision.  At end of session pt returned to room and left in w/c with all items within reach.  Vital Signs Therapy Vitals Temp: 98.4 F (36.9 C) Temp Source: Oral Pulse Rate: 75 Resp: 18 BP: (!) 109/58 Patient Position (if appropriate): Sitting Oxygen Therapy SpO2: 99 % O2 Device: Not Delivered Pain Pain Assessment Pain Assessment: No/denies pain Sensation Sensation Light Touch: Appears Intact Stereognosis: Not tested Hot/Cold: Not tested Proprioception: Appears Intact Coordination Gross Motor Movements are Fluid and Coordinated: Not tested Fine Motor Movements are Fluid and Coordinated: Not tested Motor  Motor Motor: Other (comment) Motor - Discharge Observations: generalized weakness  Mobility Bed Mobility Bed Mobility: Sit to Supine;Supine to Sit Supine to Sit: 6: Modified independent (Device/Increase time) Sit to Supine: 6: Modified independent (Device/Increase time) Transfers Transfers: Yes Stand Pivot Transfers: 5: Supervision Locomotion  Ambulation Ambulation/Gait Assistance: 5: Supervision Ambulation Distance (Feet): 150 Feet Assistive device: None Gait Gait Pattern: Step-through pattern;Decreased weight shift to right;Decreased weight shift to left Stairs / Additional Locomotion Stairs Assistance: 5: Supervision Stair Management Technique: One rail Right;Alternating pattern;Forwards Number of Stairs: 12 Height of Stairs: 7 Wheelchair Mobility Wheelchair Mobility: No  Trunk/Postural Assessment  Cervical Assessment Cervical Assessment: Within Functional Limits Thoracic Assessment Thoracic Assessment: Within Functional Limits Lumbar Assessment Lumbar Assessment: Within Functional Limits Postural Control Postural Control: Deficits on evaluation (delayed balance reactions)  Balance Standardized Balance Assessment Standardized Balance Assessment: Berg Balance Test Berg Balance Test Sit to  Stand: Able to stand without using hands and stabilize independently Standing Unsupported: Able to stand safely 2 minutes Sitting with Back Unsupported but Feet Supported on Floor or Stool: Able to sit safely and securely 2 minutes Stand  to Sit: Sits safely with minimal use of hands Transfers: Able to transfer safely, minor use of hands Standing Unsupported with Eyes Closed: Able to stand 10 seconds safely Standing Ubsupported with Feet Together: Able to place feet together independently and stand 1 minute safely From Standing, Reach Forward with Outstretched Arm: Can reach confidently >25 cm (10") From Standing Position, Pick up Object from Floor: Able to pick up shoe safely and easily From Standing Position, Turn to Look Behind Over each Shoulder: Looks behind from both sides and weight shifts well Turn 360 Degrees: Able to turn 360 degrees safely in 4 seconds or less Standing Unsupported, Alternately Place Feet on Step/Stool: Able to stand independently and safely and complete 8 steps in 20 seconds Standing Unsupported, One Foot in Front: Able to plae foot ahead of the other independently and hold 30 seconds Standing on One Leg: Tries to lift leg/unable to hold 3 seconds but remains standing independently Total Score: 52 Extremity Assessment  RLE Assessment RLE Assessment: Exceptions to [facility] RLE Strength RLE Overall Strength Comments: 4+/5 except hip flexion 3+/5 LLE Assessment LLE Assessment: Exceptions to [facility] LLE Strength LLE Overall Strength Comments: 4+/5 except hip flexion 4/5   See Function Navigator for Current Functional Status.  [name] [place] 10/15/2016, 4:16 PM

## 2016-10-15 NOTE — Progress Notes (Signed)
Occupational Therapy Discharge Summary  Patient Details  Name: [name] MRN: [id number] Date of Birth: May 31, 1934  Today's Date: 10/15/2016 OT Individual Time: 1000-1058 OT Individual Time Calculation (min): 58 min     Patient has met 12 of 12 long term goals due to improved activity tolerance, improved balance and ability to compensate for deficits.  Patient to discharge at overall Supervision level.    Reasons goals not met: all goals met  Recommendation:  Patient will benefit from ongoing skilled OT services in home health setting to continue to advance functional skills in the area of BADL and iADL.  Equipment: No equipment provided  Reasons for discharge: treatment goals met  Patient/family agrees with progress made and goals achieved: Yes   OT Intervention:  Upon entering the room, pt supine in bed with no c/o pain. Pt very excited about upcoming discharge from therapy. Pt ambulated without use of device to obtain clothing items and perform dressing with sit <>stand and distant supervision. Pt performed toileting with supervision as well. Pt standing at sink for grooming tasks without difficulty and supervision. Pt sitting in recliner chair at end of session with call bell and all needed items within reach.   OT Discharge Precautions/Restrictions Precautions Precautions: Fall Restrictions Weight Bearing Restrictions: No General   Vital Signs Therapy Vitals Pulse Rate: 61 Resp: 18 BP: (!) 121/53 Patient Position (if appropriate): Lying Oxygen Therapy SpO2: 98 % O2 Device: Not Delivered Pain Pain Assessment Pain Assessment: No/denies pain Pain Score: 5  ADL   Vision/Perception  Vision- History Baseline Vision/History: Wears glasses Wears Glasses: At all times  Cognition   Sensation Sensation Light Touch: Appears Intact Stereognosis: Not tested Hot/Cold: Not tested Proprioception: Appears Intact Coordination Gross Motor Movements are Fluid and  Coordinated: Not tested Fine Motor Movements are Fluid and Coordinated: Not tested Motor  Motor Motor - Discharge Observations: generalized weakness Mobility  Bed Mobility Bed Mobility: Sit to Supine;Supine to Sit Supine to Sit: 6: Modified independent (Device/Increase time) Sit to Supine: 6: Modified independent (Device/Increase time) Transfers Sit to Stand: 5: Supervision Stand to Sit: 5: Supervision  Trunk/Postural Assessment  Cervical Assessment Cervical Assessment: Within Functional Limits Thoracic Assessment Thoracic Assessment: Within Functional Limits Lumbar Assessment Lumbar Assessment: Within Functional Limits Postural Control Postural Control: Deficits on evaluation  Balance Balance Balance Assessed: Yes Static Sitting Balance Static Sitting - Level of Assistance: 6: Modified independent (Device/Increase time) Dynamic Sitting Balance Dynamic Sitting - Level of Assistance: 6: Modified independent (Device/Increase time) Static Standing Balance Static Standing - Level of Assistance: 6: Modified independent (Device/Increase time) Dynamic Standing Balance Dynamic Standing - Level of Assistance: 5: Stand by assistance Extremity/Trunk Assessment RUE Assessment RUE Assessment: Within Functional Limits LUE Assessment LUE Assessment: Within Functional Limits   See Function Navigator for Current Functional Status.  Gypsy Decant 10/15/2016, 7:59 PM

## 2016-10-15 NOTE — Discharge Summary (Signed)
Discharge summary job [id number]

## 2016-10-15 NOTE — Progress Notes (Signed)
Speech Language Pathology Daily Session Note  Patient Details  Name: [name] MRN: [id number] Date of Birth: 04-Jul-1934  Today's Date: 10/15/2016 SLP Individual Time: 0730-0830 SLP Individual Time Calculation (min): 60 min   Short Term Goals: Week 1: SLP Short Term Goal 1 (Week 1): Patient will utilize memory strategies and external memory aids for storage and retrieval of new, daily information given supervision verbal cues.  SLP Short Term Goal 2 (Week 1): Patient will demonstrate Mod I for anticipatory awareness of safety needs and assistance required during daily tasks.  SLP Short Term Goal 3 (Week 1): Patient will demonstrate functional problem solving of mildly complex tasks given supervision verbal cues. SLP Short Term Goal 4 (Week 1): Patient will demonstrate selective attention in a minimally distracting environment given supervision verbal cues. SLP Short Term Goal 5 (Week 1): Patient will follow mildly complex multistep directions given supervision verbal cues.   Skilled Therapeutic Interventions:Skilled therapy intervention focused on cognitive goals. Given a medication management task, patient required Min A verbal cues for problem solving the function of a 4 time a day pillbox. Patient was independently able to identify her 4 pre-admission medications and their function. Patient and sister educated on patient's 3 new medications and their function, and both verbally expressed understanding, with patient requiring supervision verbal cues for recall of new medications. Patient independently maintained alternating attention to task for 40 minutes with additional family and staff coming and going from room. Patient left supine in bed with bed alarm on and all needs within reach. Continue current plan of care.    Function:  Cognition Comprehension Comprehension assist level: Follows basic conversation/direction with no assist  Expression   Expression assist level: Expresses  basic needs/ideas: With no assist  Social Interaction Social Interaction assist level: Interacts appropriately with others with medication or extra time (anti-anxiety, antidepressant).  Problem Solving Problem solving assist level: Solves complex 90% of the time/cues < 10% of the time  Memory Memory assist level: Recognizes or recalls 75 - 89% of the time/requires cueing 10 - 24% of the time    Pain Pain Assessment Pain Assessment: No/denies pain  Therapy/Group: Individual Therapy  Thornton Papas 10/15/2016, 2:49 PM

## 2016-10-15 NOTE — Progress Notes (Signed)
[place] PHYSICAL MEDICINE & REHABILITATION     PROGRESS NOTE    Subjective/Complaints: Pt seen sitting up in her chair working with SLP this AM.  She slept well overnight and is very appreciative.    ROS: Denies CP, SOB, N/V/D.  Objective: Vital Signs: Blood pressure (!) 142/64, pulse 60, temperature 97.7 F (36.5 C), temperature source Oral, resp. rate 18, height 5' (1.524 m), weight 54.5 kg (120 lb 1.6 oz), SpO2 98 %. No results found.  Recent Labs  10/15/16 0628  WBC 5.2  HGB 11.7*  HCT 34.8*  PLT 261    Recent Labs  10/15/16 0628  NA 140  K 4.3  CL 104  GLUCOSE 111*  BUN 14  CREATININE 0.99  CALCIUM 9.6   CBG (last 3)   Recent Labs  10/14/16 1712 10/14/16 2105 10/15/16 0642  GLUCAP 159* 87 106*    Wt Readings from Last 3 Encounters:  10/09/16 54.5 kg (120 lb 1.6 oz)  10/06/16 65.8 kg (145 lb)  01/25/16 65.9 kg (145 lb 4 oz)    Physical Exam:  Gen: No acute distress. Vital signs reviewed. HENT: [place]. Bruises to the face over left eye almost healed Eyes: EOMI. No discharge. Cardiovascular: RRR. No JVD. Respiratory: CTA B. Unlabored GI: Soft. BS+, NT,ND.  Musculoskeletal: She exhibits no edema or tenderness.  Neurological: She is alert and oriented Motor: 4+-5/5 throughout bilateral upper and lower ext. (stable) Skin: intact. Bruises healing Psychiatric: Normal affect. Normal behavior. Positive.   Assessment/Plan: 1. Functional deficits secondary to debility/rhabdomyolysis which require 3+ hours per day of interdisciplinary therapy in a comprehensive inpatient rehab setting. Physiatrist is providing close team supervision and 24 hour management of active medical problems listed below. Physiatrist and rehab team continue to assess barriers to discharge/monitor patient progress toward functional and medical goals.  Function:  Bathing Bathing position   Position: Shower  Bathing parts Body parts bathed by patient: Right arm, Left arm, Chest,  Abdomen, Front perineal area, Buttocks, Right upper leg, Left upper leg, Right lower leg, Left lower leg, Back Body parts bathed by helper: Right lower leg, Left lower leg, Back  Bathing assist Assist Level: Supervision or verbal cues      Upper Body Dressing/Undressing Upper body dressing   What is the patient wearing?: Pull over shirt/dress     Pull over shirt/dress - Perfomed by patient: Put head through opening, Thread/unthread right sleeve, Thread/unthread left sleeve, Pull shirt over trunk          Upper body assist Assist Level: Supervision or verbal cues   Set up : To obtain clothing/put away  Lower Body Dressing/Undressing Lower body dressing   What is the patient wearing?: Underwear, Pants, Non-skid slipper socks, Shoes Underwear - Performed by patient: Thread/unthread right underwear leg, Thread/unthread left underwear leg, Pull underwear up/down   Pants- Performed by patient: Thread/unthread right pants leg, Thread/unthread left pants leg, Pull pants up/down   Non-skid slipper socks- Performed by patient: Don/doff right sock, Don/doff left sock       Shoes - Performed by patient: Don/doff right shoe, Don/doff left shoe            Lower body assist Assist for lower body dressing: Supervision or verbal cues      Toileting Toileting   Toileting steps completed by patient: Adjust clothing prior to toileting, Performs perineal hygiene, Adjust clothing after toileting   Toileting Assistive Devices: Grab bar or rail  Toileting assist Assist level: Supervision or verbal cues  Transfers Chair/bed transfer   Chair/bed transfer method: Ambulatory Chair/bed transfer assist level: Supervision or verbal cues Chair/bed transfer assistive device: Armrests     Locomotion Ambulation     Max distance: 300 Assist level: Supervision or verbal cues   Wheelchair Wheelchair activity did not occur: N/A (pt ambulatory on unit)        Cognition Comprehension  Comprehension assist level: Follows basic conversation/direction with no assist  Expression Expression assist level: Expresses basic needs/ideas: With no assist  Social Interaction Social Interaction assist level: Interacts appropriately with others with medication or extra time (anti-anxiety, antidepressant).  Problem Solving Problem solving assist level: Solves basic 90% of the time/requires cueing < 10% of the time  Memory Memory assist level: Recognizes or recalls 75 - 89% of the time/requires cueing 10 - 24% of the time   Medical Problem List and Plan: 1.  Debilitation secondary to rhabdomyolysis/multi-medical  -continue CIR  2.  DVT Prophylaxis/Anticoagulation: Subcutaneous Lovenox. Monitor platelet counts and any signs of bleeding 3. Pain Management: Tylenol as needed, controlled at present 4. Mood: Xanax 0.25 mg 3 times a day as needed  -continue scheduled xanax 0.25mg  TID as she was taking at home---this seems to have helped.  -team providing ego support as necessary  -Started on Fluoxetine after discussion with Neuropsych on 11/20 5. Neuropsych: This patient is capable of making decisions on her own behalf. 6. Skin/Wound Care: encourage adequate nutrition 7. Fluids/Electrolytes/Nutrition: encourage PO 8. Hypertension. Plendil 5 mg daily, Cozaar 100 mg daily.   -Relatively controlled on 11/21 9. Diabetes mellitus. Hemoglobin A1c 6.2. Check blood sugars before meals and at bedtime.SSI. Patient on Glucophage 750 mg daily prior to admission.    Intake still somewhat inconsistent  Resume glucophage as needed  Overall controlled with 1 spike on 11/21 10. Hypothyroidism. Synthroid. Latest TSH 1.510 11. Hyperlipidemia. Lipitor 12. History of left breast cancer. 13. ABLA:    11.7 on 11/21 (Improving) 14. ?CKD: Cr 0.99 on 11/21 15. Nausea: zofran prn  -Improved  LOS (Days) 6 A FACE TO FACE EVALUATION WAS PERFORMED  Ankit Lorie Phenix 10/15/2016 8:44 AM

## 2016-10-15 NOTE — Discharge Summary (Signed)
[name], [name] NO.:  [id number]  MEDICAL RECORD NO.:  [id number]  LOCATION:  4W21C                        FACILITY:  [facility]  PHYSICIAN:  [name], P.A.  DATE OF BIRTH:  1934/03/02  DATE OF ADMISSION:  10/09/2016 DATE OF DISCHARGE:                              DISCHARGE SUMMARY   DISCHARGE DATE:  10/16/2016.  DISCHARGE DIAGNOSES: 1. Rhabdomyolysis, multi-medical with debilitation. 2. Subcutaneous Lovenox for deep venous thrombosis prophylaxis. 3. Pain management. 4. Anxiety. 5. Hypertension. 6. Diabetes mellitus with peripheral neuropathy. 7. Hypothyroidism. 8. Hyperlipidemia. 9. History of breast cancer. 10.Acute blood loss anemia. 11.Nausea, resolved.  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  She lives alone, was independent prior to admission with progressive decline over the past 6 months.  Her husband has dementia and was currently on a skilled nursing facility. Presented October 06, 2016, after being found face down on the kitchen floor by her sister, altered mental status, bouts of nausea, and vomiting.  Reports of progressive weakness over the past 6 weeks. Recent imaging of the abdomen by PCP was unremarkable.  She was scheduled to have an upper GI series performed prior to this admission. She had reported a recent weight loss.  Blood pressure elevated at 172/116, WBC 16,400, creatinine 1.08, and CPK 2341.  Troponin negative. CT and imaging revealed hypoattenuation of the left frontoparietal lobe. She did not receive tPA.  MRI and MRA of the brain showed no acute findings.  Chronic microvascular disease and atrophy.  There was a 2 mm right ICA aneurysm.  EEG negative for seizure.  Carotid Dopplers completed showed right internal carotid artery, 59% stenosis. Echocardiogram with a 60% ejection  fraction, grade 1 diastolic dysfunction.  Upper GI with KUB showed no esophageal stricture or mass. Neurology consulted, maintained on aspirin.  Subcutaneous Lovenox for DVT prophylaxis.  She was advanced to a regular diet.  The patient was admitted for comprehensive rehab program.  PAST MEDICAL HISTORY:  See discharge diagnoses.  SOCIAL HISTORY:  Lives alone, independent prior to admission.  FUNCTIONAL STATUS UPON ADMISSION TO REHAB SERVICES:  Minimal assist 50 feet rolling walker, minimal assist stand pivot transfers, min to mod assist activities of daily living.  PHYSICAL EXAMINATION:  VITAL SIGNS:  Blood pressure 152/70, pulse 105, temperature 97, and respirations 18. GENERAL:  This was an alert female, right-handed.  Mild confusion, needed some redirection at time, multiple bruises to the face. LUNGS:  Clear to auscultation without wheeze.  Normal breath sounds. ABDOMEN:  Soft, bowel sounds normal, nontender.  No distention. CARDIAC:  Regular rate and rhythm without murmur.  Motor strength 4+/5. No ataxia.  REHABILITATION HOSPITAL COURSE:  The patient was admitted to inpatient rehab services with therapies initiated on a 3-hour daily basis, consisting of physical therapy, occupational therapy, speech therapy, and rehabilitation nursing.  The following issues were addressed during the patient's rehabilitation stay.  Pertaining to Ms. [name], rhabdomyolysis, debilitation, multi-medical remained stable.  She was participating fully with her therapy program.  Subcutaneous Lovenox for DVT prophylaxis.  No bleeding episodes.  Xanax  for anxiety as well as placed on low-dose Prozac after discussion with neuropsychology team providing EGO support as necessary.  Blood pressures controlled with Plendil as well as Cozaar.  She would follow up with her primary care provider.  Diabetes mellitus, peripheral neuropathy.  Hemoglobin A1c of 6.2.  She had been on Glucophage, this would be  resumed as tolerated, overall blood sugars were doing well.  She remained on hormone supplement for hypothyroidism, latest TSH of 1.510.  Lipitor for hyperlipidemia.  Close monitoring of renal function with latest creatinine 0.92.  The patient received weekly collaborative interdisciplinary team conferences.  The patient ambulated to and from toilet and all toileting tasks with supervision, engage in higher level cognitive memory activity of making gifts and providing full instructions.  Performed upper and lower strengthening, endurance training with supervision.  Required supervision in some modest verbal cues to alternate obstacle course.  She could gather her belongings for activities of daily living and homemaking, ambulating without the use of assistive device and supervision overall to obtain needed items.  Speech therapy followup with money management tasks to address functional problem solving.  She was able to count money and generate values of coins 90% accurately and supervision.  Full family teaching was completed and plan discharge to home.  DISCHARGE MEDICATIONS:  Included, 1. Xanax 0.25 mg p.o. t.i.d. 2. Aspirin 325 mg p.o. daily. 3. Lipitor 40 mg p.o. daily. 4. Plendil 5 mg p.o. daily. 5. Prozac 10 mg p.o. daily. 6. Synthroid 50 mcg p.o. daily. 7. Cozaar 100 mg p.o. daily. 8. Protonix 40 mg p.o. daily. 9. MiraLAX daily, hold for loose stools. 10.Xanax 0.25 mg p.o. t.i.d. as needed.  DIET:  Diabetic diet.  FOLLOWUP:  She would follow up with Dr. Delice Solis at the outpatient rehab service office as needed; Dr. Antony Contras, call for appointment; and Dr. Lajean Solis, Medical Management.     [name], P.A.     DA/MEDQ  D:  10/15/2016  T:  10/15/2016  Job:  [id number]  cc:   [name], M.D. Emily Lesch, MD Emily P. Leonie Man, MD

## 2016-10-15 NOTE — Consult Note (Signed)
INITIAL DIAGNOSTIC EVALUATION - [facility]   MEDICAL NECESSITY:  [name] was seen on the [facility] [place] [facility] for an initial diagnostic evaluation owing to the patient's diagnosis of debility.   Records indicate that Mrs. [name] is an "82 y.o. right handed female with history of diabetes mellitus, hypertension, breast cancer, CVA with residual tremor right side. Per chart review, patient lives alone and was independent with ADLs and mobility prior to admission with progressive decline over the past 6 weeks. Her husband has dementia and is currently in a skilled nursing facility. Presented 10/06/2016 after being found face down on the kitchen floor of her home by her sister with altered mental status as well as bouts of nausea and vomiting. Reports of progressive weakness over the past 6 weeks. Recent imaging of abdomen by PCP unremarkable. Patient was scheduled to have an upper GI series performed prior to this latest event. Patient has had a 40 pound weight loss since the beginning of January. Blood pressure elevated 172/116, WBC 16,400, creatinine 1.08, CPK 2341. Troponin negative. CT and imaging revealed hypoattenuation of the left frontoparietal lobe. Patient did not receive TPA. MRI/MRA of the brain showed no acute finding. Chronic microvascular disease and atrophy. There was a 2 mm right ICA aneurysm. EEG negative for seizure."   During today's visit, [name] was accompanied by her sister who assisted with the history. They have both noticed improving mild cognitive difficulties that were present prior to her present medical circumstances but were vastly exacerbated by the situation. In particular, she notes memory loss and slowed processing speed.   Emotionally, [name] has been quite depressed. She feels less independent. She also feels that she has no purpose in life since her husband was placed in a nursing home, as  she cared for him for several years because he suffers from dementia. She has a negative outlook on the future, and has additionally has been grieving the death of her son for over 64 years as she never truly coped with that loss. She was never treated for mood symptoms until about 2.5 months ago when she was prescribed an anxiolytic, which she finds to be greatly helpful. She has never participated in counseling. Of note, the patient has reportedly been saying that she wishes she would die and that she no longer wants to be alive. No adjustment issues endorsed. Current suicidal/homicidal ideation, plan or intent was denied. No manic or hypomanic episodes were reported. The patient denied ever experiencing any auditory/visual hallucinations. No major behavioral or personality changes were endorsed.   [name] feels that she has made "remarkable" gains in therapy. No barriers identified. She has been very satisfied with the rehab staff. Her sister and other family members are very supportive. She presently lives alone and family is about 12 minutes away. She also has great neighbors who are willing to help her with her transition home.   PROCEDURES: [1 unit 90791] Diagnostic clinical interview  Review of available records   IMPRESSION: Overall, [name] endorsed experiencing mild cognitive difficulties that are most likely related to her present medical circumstances and possibly her prior CVA. If they do not abate with improved health, then we can consider further evaluation. From an emotional standpoint, I recommend implementing an antidepressant and this was already discussed with her physician who was agreeable. I also strongly recommended participating in counseling and this is in the works. I will follow-up with [name] for coping next  week if she has not discharged.    DIAGNOSES:  Major Depressive Disorder  Unspecified Cognitive Disorder   Rutha Bouchard, Psy.D.,  ABN Board-Certified Clinical Neuropsychologist

## 2016-10-16 LAB — GLUCOSE, CAPILLARY: Glucose-Capillary: 109 mg/dL — ABNORMAL HIGH (ref 65–99)

## 2016-10-16 MED ORDER — LOSARTAN POTASSIUM 100 MG PO TABS: 100.0000 mg | ORAL_TABLET | Freq: Every day | ORAL | 0 refills | Status: AC

## 2016-10-16 MED ORDER — ACETAMINOPHEN 325 MG PO TABS: 325.0000 mg | ORAL_TABLET | Freq: Four times a day (QID) | ORAL | Status: DC | PRN

## 2016-10-16 MED ORDER — FLUOXETINE HCL 10 MG PO CAPS: 10.0000 mg | ORAL_CAPSULE | Freq: Every day | ORAL | 0 refills | Status: DC

## 2016-10-16 NOTE — Patient Care Conference (Signed)
Inpatient RehabilitationTeam Conference and Plan of Care Update Date: 10/16/2016   Time: 11:50 am    Patient Name: [name]      Medical Record Number: [id number]  Date of Birth: 04-Dec-1933 Sex: Female         Room/Bed: 4M06C/4M06C-01 Payor Info: Payor: MEDICARE / Plan: MEDICARE PART A AND B / Product Type: *No Product type* /    Admitting Diagnosis: Debility  Admit Date/Time:  10/09/2016  5:50 PM Admission Comments: No comment available   Primary Diagnosis:  Debilitated Principal Problem: Debilitated  Patient Active Problem List   Diagnosis Date Noted  . Severe episode of recurrent major depressive disorder, without psychotic features ([place])   . Reactive depression   . Debilitated 10/09/2016  . Transient cerebral ischemia   . Anxiety state   . Controlled type 2 diabetes mellitus with complication, without long-term current use of insulin ([place])   . History of breast cancer   . Acute blood loss anemia   . Stage 2 chronic kidney disease   . Syncope 10/07/2016  . Stroke ([place]) 10/06/2016  . Stroke (cerebrum) ([place]) 10/06/2016  . Aphasia 10/06/2016  . Leukocytosis 10/06/2016  . Essential hypertension 10/06/2016  . Hypothyroidism 10/06/2016  . GERD (gastroesophageal reflux disease) 10/06/2016  . Hyperlipemia 10/06/2016    Expected Discharge Date: Expected Discharge Date: 10/16/16  Team Members Present: Physician leading conference: Dr. Delice Lesch Social Worker Present: Lennart Pall, LCSW Nurse Present: Dorien Chihuahua, RN PT Present: Canary Brim, PT OT Present: Benay Pillow, OT PPS Coordinator present : Daiva Nakayama, RN, CRRN     Current Status/Progress Goal Weekly Team Focus  Medical   Debilitation secondary to rhabdomyolysis/multi-medical  Improve mobility  See above   Bowel/Bladder   Pt continent of bowel and bladder LBM 10/14/16  REmain continent of bowel and bladder   Monitor   Swallow/Nutrition/ Hydration             ADL's   supervision  overall supervision   d/c planning, strengthening, self care retraining, pt /family education   Mobility   Supervision  supervision  D/C planning, dynamic gait and balance, LE strengthening   Communication             Safety/Cognition/ Behavioral Observations            Pain   No complaints of pain   <4   Assess and treat accordingly    Skin   Abdominal bruising   no new breakdown  monitor     Rehab Goals Patient on target to meet rehab goals: Yes *See Care Plan and progress notes for long and short-term goals.  Barriers to Discharge: None, d/c today    Possible Resolutions to Barriers:  None    Discharge Planning/Teaching Needs:  Pt to d/c home with family to provide 24/7 supervision initially.      Team Discussion:  Pt has made excellent gains and has reached her supervision goals.  Family education completed and pt ready for d/c.  Revisions to Treatment Plan:  NA   Continued Need for Acute Rehabilitation Level of Care: The patient requires daily medical management by a physician with specialized training in physical medicine and rehabilitation for the following conditions: Daily analysis of laboratory values and/or radiology reports with any subsequent need for medication adjustment of medical intervention for : Other  Lennart Pall 10/18/2016, 12:24 PM

## 2016-10-16 NOTE — Discharge Instructions (Signed)
Inpatient Rehab Discharge Instructions  [name] Discharge date and time: 10/16/16   Activities/Precautions/ Functional Status: Activity: no lifting, driving, or strenuous exercise  till cleared by MD.  Diet: diabetic diet Wound Care: none needed   Functional status:  ___ No restrictions     ___ Walk up steps independently _X__ 24/7 supervision/assistance   ___ Walk up steps with assistance ___ Intermittent supervision/assistance  ___ Bathe/dress independently ___ Walk with walker     _X__ Bathe/dress with assistance ___ Walk Independently    ___ Shower independently ___ Walk with assistance    ___ Shower with assistance _X__ No alcohol     ___ Return to work/school ________   COMMUNITY REFERRALS UPON DISCHARGE:    Outpatient: PT     OT    ST                  Agency: [facility]     Phone:  [phone]                Appointment Date/Time:  10/22/13  @ 10:15 (PT), 11:00 (OT) and 2:00 (ST)    (please arrive 30 mins early the first day only)   GENERAL COMMUNITY RESOURCES FOR PATIENT/FAMILY:  Support Groups: (info provided)    Mental Health:  (info provided)    Special Instructions:    My questions have been answered and I understand these instructions. I will adhere to these goals and the provided educational materials after my discharge from the hospital.  Patient/Caregiver Signature _______________________________ Date __________  Clinician Signature _______________________________________ Date __________  Please bring this form and your medication list with you to all your follow-up doctor's appointments.

## 2016-10-16 NOTE — Progress Notes (Signed)
Speech Language Pathology Discharge Summary  Patient Details  Name: [name] MRN: [id number] Date of Birth: [birth year]   Patient has met 5 of 5 long term goals.  Patient to discharge at overall Supervision level.   Reasons goals not met: N/A   Clinical Impression/Discharge Summary: Patient has made excellent gains and has met 5 of 5 LTG's this admission due to improved cognitive functioning. Currently, patient requires overall supervision verbal cues to complete functional and familiar tasks safely in regards to mildly complex problem solving, recall with use of external aids, attention and overall safety. Patient and family education is complete and patient will discharge home with 24 hour supervision from family. Patient would benefit from f/u outpatient services to maximize her cognitive function and overall functional independence in order to reduce caregiver burden.   Care Partner:  Caregiver Able to Provide Assistance: Yes  Type of Caregiver Assistance: Cognitive  Recommendation:  24 hour supervision/assistance;Outpatient SLP  Rationale for SLP Follow Up: Maximize cognitive function and independence;Reduce caregiver burden   Equipment: N/A   Reasons for discharge: Treatment goals met;Discharged from hospital   Patient/Family Agrees with Progress Made and Goals Achieved: Yes     [place], [place] 10/16/2016, 9:23 AM

## 2016-10-16 NOTE — Progress Notes (Signed)
Educated on medication, she was concern on why I was giving her 2000 medication at 20. Writer inform patient medication was given at the appropriate time, inform her it was okay. She express no other concerns. Patient slept well through out the shift.

## 2016-10-16 NOTE — Progress Notes (Signed)
[facility]     PROGRESS NOTE    Subjective/Complaints: Pt standing getting ready this AM for discharge.  She slept well and is very appreciative of her care.   ROS: Denies CP, SOB, N/V/D.  Objective: Vital Signs: Blood pressure 118/61, pulse 70, temperature 98 F (36.7 C), temperature source Oral, resp. rate 18, height 5' (1.524 m), weight 54.5 kg (120 lb 1.6 oz), SpO2 98 %. No results found.  Recent Labs  10/15/16 0628  WBC 5.2  HGB 11.7*  HCT 34.8*  PLT 261    Recent Labs  10/15/16 0628  NA 140  K 4.3  CL 104  GLUCOSE 111*  BUN 14  CREATININE 0.99  CALCIUM 9.6   CBG (last 3)   Recent Labs  10/15/16 1654 10/15/16 2040 10/16/16 0648  GLUCAP 162* 100* 109*    Wt Readings from Last 3 Encounters:  10/09/16 54.5 kg (120 lb 1.6 oz)  10/06/16 65.8 kg (145 lb)  01/25/16 65.9 kg (145 lb 4 oz)    Physical Exam:  Gen: No acute distress. Vital signs reviewed. Well-developed.  HENT: [place]. Bruises healed Eyes: EOMI. No discharge. Cardiovascular: RRR. No JVD. Respiratory: CTA B. Unlabored GI: Soft. BS+, NT,ND.  Musculoskeletal: She exhibits no edema or tenderness.  Neurological: She is alert and oriented Motor: 5/5 throughout bilateral upper and lower ext.  Skin: intact. Bruises healing Psychiatric: Normal affect. Normal behavior. Positive.   Assessment/Plan: 1. Functional deficits secondary to debility/rhabdomyolysis which require 3+ hours per day of interdisciplinary therapy in a comprehensive inpatient rehab setting. Physiatrist is providing close team supervision and 24 hour management of active medical problems listed below. Physiatrist and rehab team continue to assess barriers to discharge/monitor patient progress toward functional and medical goals.  Function:  Bathing Bathing position   Position: Shower  Bathing parts Body parts bathed by patient: Right arm, Left arm, Chest, Abdomen, Front perineal area, Buttocks,  Right upper leg, Left upper leg, Right lower leg, Left lower leg, Back Body parts bathed by helper: Right lower leg, Left lower leg, Back  Bathing assist Assist Level: Supervision or verbal cues      Upper Body Dressing/Undressing Upper body dressing   What is the patient wearing?: Pull over shirt/dress     Pull over shirt/dress - Perfomed by patient: Put head through opening, Thread/unthread right sleeve, Thread/unthread left sleeve, Pull shirt over trunk          Upper body assist Assist Level: Supervision or verbal cues   Set up : To obtain clothing/put away  Lower Body Dressing/Undressing Lower body dressing   What is the patient wearing?: Underwear, Pants, Non-skid slipper socks, Shoes Underwear - Performed by patient: Thread/unthread right underwear leg, Thread/unthread left underwear leg, Pull underwear up/down   Pants- Performed by patient: Thread/unthread right pants leg, Thread/unthread left pants leg, Pull pants up/down   Non-skid slipper socks- Performed by patient: Don/doff right sock, Don/doff left sock       Shoes - Performed by patient: Don/doff right shoe, Don/doff left shoe            Lower body assist Assist for lower body dressing: Supervision or verbal cues      Toileting Toileting   Toileting steps completed by patient: Adjust clothing prior to toileting, Performs perineal hygiene, Adjust clothing after toileting   Toileting Assistive Devices: Grab bar or rail  Toileting assist Assist level: Supervision or verbal cues   Transfers Chair/bed transfer   Chair/bed transfer  method: Ambulatory Chair/bed transfer assist level: Supervision or verbal cues Chair/bed transfer assistive device: Armrests     Locomotion Ambulation     Max distance: 300 Assist level: Supervision or verbal cues   Wheelchair Wheelchair activity did not occur: N/A        Cognition Comprehension Comprehension assist level: Follows basic conversation/direction with no  assist  Expression Expression assist level: Expresses basic needs/ideas: With no assist  Social Interaction Social Interaction assist level: Interacts appropriately with others with medication or extra time (anti-anxiety, antidepressant).  Problem Solving Problem solving assist level: Solves complex 90% of the time/cues < 10% of the time  Memory Memory assist level: Recognizes or recalls 75 - 89% of the time/requires cueing 10 - 24% of the time   Medical Problem List and Plan: 1.  Debilitation secondary to rhabdomyolysis/multi-medical  -D/c today  Will see pt in 1-2 weeks for transitional care management 2.  DVT Prophylaxis/Anticoagulation: Subcutaneous Lovenox. Monitor platelet counts and any signs of bleeding 3. Pain Management: Tylenol as needed, controlled at present 4. Mood: Xanax 0.25 mg 3 times a day as needed  -continue scheduled xanax 0.25mg  TID as she was taking at home---this seems to have helped.  -team providing ego support as necessary  -Started on Fluoxetine after discussion with Neuropsych on 11/20 5. Neuropsych: This patient is capable of making decisions on her own behalf. 6. Skin/Wound Care: encourage adequate nutrition 7. Fluids/Electrolytes/Nutrition: encourage PO 8. Hypertension. Plendil 5 mg daily, Cozaar 100 mg daily.   -Relatively controlled on 11/22 9. Diabetes mellitus. Hemoglobin A1c 6.2. Check blood sugars before meals and at bedtime.SSI. Patient on Glucophage 750 mg daily prior to admission.    Intake still somewhat inconsistent  Resume glucophage as needed  Overall controlled on 11/22 10. Hypothyroidism. Synthroid. Latest TSH 1.510 11. Hyperlipidemia. Lipitor 12. History of left breast cancer. 13. ABLA:    11.7 on 11/21 (Improving) 14. ?CKD: Cr 0.99 on 11/21 15. Nausea: zofran prn  -Improved  LOS (Days) 7 A FACE TO FACE EVALUATION WAS PERFORMED  Ankit Lorie Phenix 10/16/2016 9:22 AM

## 2016-10-16 NOTE — Progress Notes (Signed)
Pt discharged to home with family. Discharge instructions were given and patient has all belongings

## 2016-10-16 NOTE — Progress Notes (Addendum)
Speech Language Pathology Daily Session Note  Patient Details  Name: [name] MRN: [id number] Date of Birth: [birth year]  Today's Date: 10/16/2016 SLP Concurrent Time: 0730-0825 SLP Concurrent Time Calculation (min): 55 min    Short Term Goals: Week 1: SLP Short Term Goal 1 (Week 1): Patient will utilize memory strategies and external memory aids for storage and retrieval of new, daily information given supervision verbal cues.  SLP Short Term Goal 2 (Week 1): Patient will demonstrate Mod I for anticipatory awareness of safety needs and assistance required during daily tasks.  SLP Short Term Goal 3 (Week 1): Patient will demonstrate functional problem solving of mildly complex tasks given supervision verbal cues. SLP Short Term Goal 4 (Week 1): Patient will demonstrate selective attention in a minimally distracting environment given supervision verbal cues. SLP Short Term Goal 5 (Week 1): Patient will follow mildly complex multistep directions given supervision verbal cues.   Skilled Therapeutic Interventions:Skilled therapy intervention focused on cognitive goals. Patient was given the [facility] Cognitive Assessment (MoCA) to compare against her score at admission. On 10/10/16 patient scored 15/22 points on the [facility] with 18 points or better considered within normal limits. Today the patient scored 24/30 points on the MoCA 7.3, with 26 points or better considered within normal limits. (When adjusted for the [facility] rubric, patient would have scored 17/22 points today.) Patient was encouraged to hear of progress. Patient independently demonstrated selective attention to the assessment task for 30 minutes in a minimally distracting environment. Patient independently expressed anticipatory awareness of outpatient ST needs, asking questions about how therapy would be different in that setting. Education given to patient and sister on what to expect in outpatient ST and patient and sister  both verbally expressed understanding with no questions at this time. Patient left with sister in room.    Function:  Cognition Comprehension Comprehension assist level: Follows basic conversation/direction with no assist  Expression   Expression assist level: Expresses basic needs/ideas: With no assist  Social Interaction Social Interaction assist level: Interacts appropriately with others with medication or extra time (anti-anxiety, antidepressant).  Problem Solving Problem solving assist level: Solves complex 90% of the time/cues < 10% of the time  Memory Memory assist level: More than reasonable amount of time    Pain Pain Assessment Pain Assessment: No/denies pain  Therapy/Group: Individual Therapy  Thornton Papas 10/16/2016, 12:55 PM

## 2016-10-18 NOTE — Progress Notes (Signed)
Social Work  Discharge Note  The overall goal for the admission was met for:   Discharge location: Yes - returning to her own home with family and friends to provide 24/7 supervision.  Length of Stay: Yes - 7 days  Discharge activity level: Yes - supervision  Home/community participation: Yes  Services provided included: MD, RD, PT, OT, SLP, RN, TR, Pharmacy, Neuropsych and SW  Financial Services: Medicare and Private Insurance: [place]  Follow-up services arranged: Outpatient: PT, OT, ST via [facility] and Patient/Family has no preference for HH/DME agencies  Comments (or additional information):  Patient/Family verbalized understanding of follow-up arrangements: Yes  Individual responsible for coordination of the follow-up plan: pt  Confirmed correct DME delivered: NA    Rosellen Lichtenberger

## 2016-10-21 ENCOUNTER — Telehealth: Payer: Self-pay | Admitting: *Deleted

## 2016-10-21 NOTE — Telephone Encounter (Signed)
Transitional Care call contact made with [name]  1. Are you/is patient experiencing any problems since coming home? Are there any questions regarding any aspect of care?No 2. Are there any questions regarding medications administration/dosing? Are meds being taken as prescribed? Patient should review meds with caller to confirm All medication accounted for. 3. Have there been any falls? No 4. Has Home Health been to the house and/or have they contacted you? If not, have you tried to contact them? Can we help you contact them? No HH ordered. She is going to out pt PT/OT/ST and has appts scheduled 5. Are bowels and bladder emptying properly? Are there any unexpected incontinence issues? If applicable, is patient following bowel/bladder programs? No problems 6. Any fevers, problems with breathing, unexpected pain? No 7. Are there any skin problems or new areas of breakdown? No 8. Has the patient/family member arranged specialty MD follow up (ie cardiology/neurology/renal/surgical/etc)?  Can we help arrange? No, appt given today for [name] 9. Does the patient need any other services or support that we can help arrange? No 10. Are caregivers following through as expected in assisting the patient? Yes 11. Has the patient quit smoking, drinking alcohol, or using drugs as recommended? N/A  Appt with [name] 10/23/16 @ 10:20 arrive by 10:00 [address]

## 2016-10-22 ENCOUNTER — Ambulatory Visit: Payer: Medicare Other | Admitting: Occupational Therapy

## 2016-10-22 ENCOUNTER — Ambulatory Visit: Payer: Medicare Other

## 2016-10-22 ENCOUNTER — Ambulatory Visit: Payer: Medicare Other | Attending: Physical Medicine & Rehabilitation

## 2016-10-22 DIAGNOSIS — M6281 Muscle weakness (generalized): Secondary | ICD-10-CM | POA: Insufficient documentation

## 2016-10-22 DIAGNOSIS — R2689 Other abnormalities of gait and mobility: Secondary | ICD-10-CM | POA: Insufficient documentation

## 2016-10-22 DIAGNOSIS — R4701 Aphasia: Secondary | ICD-10-CM

## 2016-10-22 DIAGNOSIS — I69818 Other symptoms and signs involving cognitive functions following other cerebrovascular disease: Secondary | ICD-10-CM | POA: Diagnosis not present

## 2016-10-22 DIAGNOSIS — R41841 Cognitive communication deficit: Secondary | ICD-10-CM | POA: Diagnosis not present

## 2016-10-22 DIAGNOSIS — R251 Tremor, unspecified: Secondary | ICD-10-CM

## 2016-10-22 NOTE — Therapy (Signed)
[facility] [address] [place], [place], [address] Phone: [phone]   Fax:  [phone]  Occupational Therapy Evaluation  Patient Details  Name: [name] MRN: [id number] Date of Birth: [birth year] Referring Provider: Dr. Delice Lesch  Encounter Date: 10/22/2016      OT End of Session - 10/22/16 1425    Visit Number 1   Number of Visits 8   Authorization Type MCR - G code needed   Authorization - Visit Number 1   Authorization - Number of Visits 10   OT Start Time 1100   OT Stop Time 1145   OT Time Calculation (min) 45 min   Activity Tolerance Patient tolerated treatment well      Past Medical History:  Diagnosis Date  . Arthritis   . Cancer [facility])    breast cancer on left - 10  years ago  . Diabetes mellitus without complication ([place])   . GERD (gastroesophageal reflux disease)   . Hypertension   . Hypothyroidism   . Stroke ([place])    2014  . Wears glasses     Past Surgical History:  Procedure Laterality Date  . ABDOMINAL HYSTERECTOMY    . APPENDECTOMY    . ARCUATE KERATECTOMY    . BUNIONECTOMY     right  . CARPAL TUNNEL RELEASE     right  . CARPAL TUNNEL RELEASE Left 05/24/2014   Procedure: LEFT CARPAL TUNNEL RELEASE LEFT THUMB TRIGGER FINGER RELEASE;  Surgeon: Cammie Sickle, MD;  Location: [place];  Service: Orthopedics;  Laterality: Left;  left thumb also site of incision  . DEBRIDEMENT OF ABDOMINAL WALL ABSCESS  1978  . OVARIAN CYST SURGERY     x2-  . TONSILLECTOMY    . TRIGGER FINGER RELEASE Left 01/25/2016   Procedure: RELEASE A-1 PULLEY LEFT RING FINGER, LEFT SMALL FINGER;  Surgeon: Daryll Brod, MD;  Location: [place];  Service: Orthopedics;  Laterality: Left;  ANESTHESIA: IV REGIONAL FAB    There were no vitals filed for this visit.      Subjective Assessment - 10/22/16 1106    Subjective  I live alone, but I have great neighbors   Currently  in Pain? Yes  O.T. not addressing secondary to outside scope of practice   Pain Score 6    Pain Location Buttocks   Pain Orientation Right;Left   Pain Descriptors / Indicators Aching;Sore   Pain Onset In the past 7 days   Pain Frequency Intermittent   Aggravating Factors  sitting down, laying down   Pain Relieving Factors standing           [facility] OT Assessment - 10/22/16 1108      Assessment   Diagnosis Rhabdomyolysis  h/o CVA, breast CA, ICA aneurysm, chronic microvascular dz   Referring Provider Dr. Delice Lesch   Onset Date 10/06/16  Hospitalized   Prior Therapy inpatient rehab 11/15 - 10/16/16     Precautions   Precautions Fall   Precaution Comments no driving since [id number]     Restrictions   Weight Bearing Restrictions No     Balance Screen   Has the patient fallen in the past 6 months Yes   How many times? Leon;Door  with grab bars   Additional Comments Pt lives in townhome (1 story) with level entry.    Lives With Alone  Prior Function   Level of Independence Independent with basic ADLs   Vocation Retired   Leisure Pt loves gardening, walks with neighbor and her dog     ADL   Eating/Feeding Independent   Grooming Independent   Upper Body Bathing Modified independent   Lower Body Bathing Modified independent   Upper Body Dressing Independent   Lower Secretary/administrator - Water engineer -  Audiological scientist bars     IADL   Shopping Assistance for transportation;Needs to be accompanied on any shopping trip   Meal Prep Able to complete simple cold meal and snack prep;Able to complete simple warm meal prep  family providing meals   Community Mobility Relies on family or friends for transportation  since last hospital admission   Medication  Management Takes responsibility if medication is prepared in advance in seperate dosage;Has difficulty remembering to take medication  sister calling to remind her to take medication     Mobility   Mobility Status Independent     Written Expression   Dominant Hand Right   Handwriting 90% legible  with slight intentional tremors bilaterally     Vision - History   Baseline Vision Wears glasses all the time     Cognition   Memory Impaired  delayed recall 3/3.      Observation/Other Assessments   Observations Pt does have memory deficits however appeared to be good historian t/o evaluation and answering questions appropriately. Pt also demo intellectual awareness and inconsistent with anticipatory awareness     Coordination   9 Hole Peg Test Right;Left   Right 9 Hole Peg Test 26.28 sec   Left 9 Hole Peg Test 28.29 sec   Coordination slight tremors noted when performing 9 hole peg test     Edema   Edema none in UE's, pt reports sometimes in the feet (Lt > Rt)      ROM / Strength   AROM / PROM / Strength Strength     AROM   Overall AROM Comments BUE AROM WNL's     Strength   Overall Strength Comments BUE MMT grossly 5/5     Hand Function   Right Hand Grip (lbs) 30 lbs   Left Hand Grip (lbs) 30 lbs                              OT Long Term Goals - 10/22/16 1430      OT LONG TERM GOAL #1   Title Pt/family to verbalize understanding with safety considerations if pt continues to live alone (including possible need for Lifeline, need for supervision with cooking and/or providing meals, etc)    Time 4   Period Weeks   Status New     OT LONG TERM GOAL #2   Title Pt to verbalize understanding with memory compensatory strategies and how to implement at home   Time 4   Period Weeks   Status New     OT LONG TERM GOAL #3   Title Pt to demo simple familiar cooking task at supervision level safely and/or simple cold prep at mod I level   Time 4   Period  Weeks   Status New     OT LONG TERM GOAL #4   Title Assess and address prn DME needs for safety  in shower   Time 4   Period Weeks   Status New               Plan - 10/22/16 1426    Clinical Impression Statement Pt is a 82 y.o. female who presents to outpatient rehab s/p rhabdomyolysis, altered mental status, chronic microvascular disease, h/o CVA in 2014, h/o breast CA, cervical spondylosis. Pt is performing BADLS independently, however currently requiring assist with medication management and neighbors/family providing meals, assisting with grocery shopping. Pt would benefit from O.T. to maximize safety with ADLS secondary to living alone and for education in memory strategies.    Rehab Potential Good   OT Frequency 2x / week   OT Duration 4 weeks   OT Treatment/Interventions Self-care/ADL training;DME and/or AE instruction;Patient/family education;Therapeutic exercise;Functional Mobility Training;Cognitive remediation/compensation;Therapeutic activities   Plan memory strategies, discuss potential need for Lifeline, possible shower seat   Consulted and Agree with Plan of Care Patient      Patient will benefit from skilled therapeutic intervention in order to improve the following deficits and impairments:  Decreased endurance, Decreased safety awareness, Decreased knowledge of precautions, Decreased activity tolerance, Decreased knowledge of use of DME, Decreased cognition, Decreased mobility, Impaired perceived functional ability  Visit Diagnosis: Other symptoms and signs involving cognitive functions following other cerebrovascular disease - Plan: Ot plan of care cert/re-cert  Tremor - Plan: Ot plan of care cert/re-cert      G-Codes - 0000000 1441    Functional Assessment Tool Used clinical judgement   Functional Limitation Self care   Self Care Current Status [phone]) At least 20 percent but less than 40 percent impaired, limited or restricted   Self Care Goal Status  [id number]) At least 1 percent but less than 20 percent impaired, limited or restricted      Problem List Patient Active Problem List   Diagnosis Date Noted  . Severe episode of recurrent major depressive disorder, without psychotic features ([employer])   . Reactive depression   . Debilitated 10/09/2016  . Transient cerebral ischemia   . Anxiety state   . Controlled type 2 diabetes mellitus with complication, without long-term current use of insulin ([place])   . History of breast cancer   . Acute blood loss anemia   . Stage 2 chronic kidney disease   . Syncope 10/07/2016  . Stroke ([place]) 10/06/2016  . Stroke (cerebrum) ([place]) 10/06/2016  . Aphasia 10/06/2016  . Leukocytosis 10/06/2016  . Essential hypertension 10/06/2016  . Hypothyroidism 10/06/2016  . GERD (gastroesophageal reflux disease) 10/06/2016  . Hyperlipemia 10/06/2016    Carey Bullocks, OTR/L 10/22/2016, 2:45 PM  [facility] [address] [place], [place], [address] Phone: [phone]   Fax:  [phone]  Name: [name] MRN: [id number] Date of Birth: September 15, 1934

## 2016-10-22 NOTE — Patient Instructions (Signed)
Pt was told to work time and money problems verbally to practice those areas still difficult for her.

## 2016-10-22 NOTE — Therapy (Signed)
[facility] [address] [place], [place], [address] Phone: [phone]   Fax:  [phone]  Speech Language Pathology Evaluation  Patient Details  Name: [name] MRN: [id number] Date of Birth: 06/11/1934 Referring Provider: Jamse Arn, MD  Encounter Date: 10/22/2016      End of Session - 10/22/16 1710    Visit Number 1   Number of Visits 17   Date for SLP Re-Evaluation 01/03/17   SLP Start Time [id number]   SLP Stop Time  1447   SLP Time Calculation (min) 44 min   Activity Tolerance Patient tolerated treatment well  tearful for short time      Past Medical History:  Diagnosis Date  . Arthritis   . Cancer [facility])    breast cancer on left - 10  years ago  . Diabetes mellitus without complication ([place])   . GERD (gastroesophageal reflux disease)   . Hypertension   . Hypothyroidism   . Stroke ([place])    2014  . Wears glasses     Past Surgical History:  Procedure Laterality Date  . ABDOMINAL HYSTERECTOMY    . APPENDECTOMY    . ARCUATE KERATECTOMY    . BUNIONECTOMY     right  . CARPAL TUNNEL RELEASE     right  . CARPAL TUNNEL RELEASE Left 05/24/2014   Procedure: LEFT CARPAL TUNNEL RELEASE LEFT THUMB TRIGGER FINGER RELEASE;  Surgeon: Cammie Sickle, MD;  Location: [place];  Service: Orthopedics;  Laterality: Left;  left thumb also site of incision  . DEBRIDEMENT OF ABDOMINAL WALL ABSCESS  1978  . OVARIAN CYST SURGERY     x2-  . TONSILLECTOMY    . TRIGGER FINGER RELEASE Left 01/25/2016   Procedure: RELEASE A-1 PULLEY LEFT RING FINGER, LEFT SMALL FINGER;  Surgeon: Daryll Brod, MD;  Location: [place];  Service: Orthopedics;  Laterality: Left;  ANESTHESIA: IV REGIONAL FAB    There were no vitals filed for this visit.      Subjective Assessment - 10/22/16 1700    Subjective Pt reports feeling like she's "slogging through thinking" since the hospitalization, mild  intermittent word finding difficulties.   Currently in Pain? Yes   Pain Score 6    Pain Location Buttocks   Pain Orientation Right;Left   Pain Descriptors / Indicators Aching   Pain Type Acute pain   Pain Onset In the past 7 days   Pain Frequency Intermittent   Aggravating Factors  sitting, lying down   Pain Relieving Factors standing            SLP Evaluation [facility] - 10/22/16 1656      SLP Visit Information   SLP Received On 10/22/16   Referring Provider Jamse Arn, MD   Onset Date 10-06-16   Medical Diagnosis Multiple falls, suspected CVA     General Information   HPI [name] a 82 y.o.femalewith medical history significant of HTN, hypothyroidism, GERD, CVA w/ possible residual tremor right side, and DM type II who was found facedown on the kitchen floor of her home, bruising along lateral lt eyebrow/temple and lt hip. Per chart patient has been suffering from nausea and vomiting since January of this year intermittently. Abdominal MRI showed no overt cause symptoms and was scheduled to have a upper GI series performed 11/13 (approximately 40 pound weight loss since the beginning of the year). MRI No acute finding, including infarct, chronic microvascular disease and  atrophy and 2 mm right supraclinoid ICA aneurysm or infundibulum. CXR no active disease.     Prior Functional Status   Cognitive/Linguistic Baseline Within functional limits   Type of Home House    Lives With Alone   Available Support Family;Friend(s)   Vocation Retired     Associate Professor   Overall Cognitive Status Impaired/Different from baseline   Area of Impairment --  mental processing/auditory processing   Behaviors Poor frustration tolerance     Auditory Comprehension   Overall Auditory Comprehension Comments Appropriate for conversation today during eval, however pt reports that with more detailed/multi-contextual information she needs to pause and concentrate in order to figure the answer.  Pt gave example of something she and sister agree would have been very simple for pt before 10-06-16- "making change from $1.50 when $0.93 was owed."     Expression   Primary Mode of Expression Verbal     Verbal Expression   Overall Verbal Expression Impaired   Naming Impairment   Confrontation 75-100% accurate   Other Verbal Expression Comments No notable difficulties with word finding for pt in conversation except one error "trash box" for wastebasket/trash can. See "standardized assessments" below.     Oral Motor/Sensory Function   Overall Oral Motor/Sensory Function Appears within functional limits for tasks assessed     Motor Speech   Overall Motor Speech Appears within functional limits for tasks assessed     Standardized Assessments   Standardized Assessments  [place] Naming Test-2nd edition   [place] Naming Test-2nd edition  22/30 (odds), where >27/30 and >25/30 are considered WNL in two separate studies of the abbreviated version of 30 responses.                         SLP Education - 10/22/16 1710    Education provided Yes   Education Details ST course, probable goals   Person(s) Educated Patient;Other (comment)  sister donna   Methods Explanation   Comprehension Verbalized understanding          SLP Short Term Goals - 10/22/16 1717      SLP SHORT TERM GOAL #1   Title Pt will demo WNL word finding (with modified independence) in 10 minutes mod compelx conversation over three sessions   Time 4   Period Weeks   Status New     SLP SHORT TERM GOAL #2   Title  Pt will answer questions 85% re: time, money, calendar problems (if necessary following formal assessment of auditory comprehension/verbal attention)   Time 4   Period Weeks   Status New          SLP Long Term Goals - 10/22/16 1721      SLP LONG TERM GOAL #1   Title pt will demo WNL auditory comprehension/verbal attention/working memory with mod complex/complex questions of time,  money, and calendar with modified independence   Time 8   Period Weeks   Status New     SLP LONG TERM GOAL #2   Title pt will participate in 20 minute mod compelx/complex conversation using compensations PRN over three sessions   Time 8   Period Weeks   Status New          Plan - 10/22/16 1711    Clinical Impression Statement Pt presents today with reported anomia occurring x2-3/day in conversation, and with a [place] Naming Test score outside of WNL. Pt also reports slower mental processing/auditory comprehension issues with multi-contextual information (her  example was figuring change with unconventional amounts of money provided verbally). SLP will likely need to formally assess pt's auditory comprehension and/or auditory attention/working memory. Skilled ST is necessary to work with pt's verbal fluency over an extended period of time in various linguistic contexts and to ensure auditory comprehension/attention is at least functional for pt's improved QOL.   Speech Therapy Frequency 2x / week   Duration --  8 weeks (6 weeks?)   Treatment/Interventions Language facilitation;Internal/external aids;SLP instruction and feedback;Cognitive reorganization;Multimodal communcation approach;Patient/family education;Functional tasks;Cueing hierarchy;Compensatory strategies   Potential to Achieve Goals Good   Consulted and Agree with Plan of Care Patient      Patient will benefit from skilled therapeutic intervention in order to improve the following deficits and impairments:   Aphasia - Plan: SLP plan of care cert/re-cert  Cognitive communication deficit - Plan: SLP plan of care cert/re-cert      G-Codes - 0000000 1724    Functional Assessment Tool Used noms - 5 (reported)   Functional Limitations Spoken language comprehension   Spoken Language Comprehension Current Status [phone]) At least 20 percent but less than 40 percent impaired, limited or restricted   Spoken Language Comprehension  Goal Status (G9160) At least 1 percent but less than 20 percent impaired, limited or restricted      Problem List Patient Active Problem List   Diagnosis Date Noted  . Severe episode of recurrent major depressive disorder, without psychotic features ([place])   . Reactive depression   . Debilitated 10/09/2016  . Transient cerebral ischemia   . Anxiety state   . Controlled type 2 diabetes mellitus with complication, without long-term current use of insulin ([place])   . History of breast cancer   . Acute blood loss anemia   . Stage 2 chronic kidney disease   . Syncope 10/07/2016  . Stroke ([place]) 10/06/2016  . Stroke (cerebrum) ([place]) 10/06/2016  . Aphasia 10/06/2016  . Leukocytosis 10/06/2016  . Essential hypertension 10/06/2016  . Hypothyroidism 10/06/2016  . GERD (gastroesophageal reflux disease) 10/06/2016  . Hyperlipemia 10/06/2016    Zuriel Roskos ,MS, CCC-SLP  10/22/2016, 5:27 PM  [facility] [address] [place], [place], [address] Phone: [phone]   Fax:  [phone]  Name: [name] MRN: [id number] Date of Birth: 06/12/1934

## 2016-10-23 ENCOUNTER — Encounter: Payer: Self-pay | Admitting: Physical Medicine & Rehabilitation

## 2016-10-23 ENCOUNTER — Encounter: Payer: Medicare Other | Attending: Physical Medicine & Rehabilitation | Admitting: Physical Medicine & Rehabilitation

## 2016-10-23 VITALS — BP 152/68 | HR 70

## 2016-10-23 DIAGNOSIS — K219 Gastro-esophageal reflux disease without esophagitis: Secondary | ICD-10-CM | POA: Diagnosis not present

## 2016-10-23 DIAGNOSIS — F411 Generalized anxiety disorder: Secondary | ICD-10-CM | POA: Insufficient documentation

## 2016-10-23 DIAGNOSIS — I69818 Other symptoms and signs involving cognitive functions following other cerebrovascular disease: Secondary | ICD-10-CM | POA: Diagnosis not present

## 2016-10-23 DIAGNOSIS — Z87891 Personal history of nicotine dependence: Secondary | ICD-10-CM | POA: Insufficient documentation

## 2016-10-23 DIAGNOSIS — R5381 Other malaise: Secondary | ICD-10-CM | POA: Diagnosis not present

## 2016-10-23 DIAGNOSIS — R2689 Other abnormalities of gait and mobility: Secondary | ICD-10-CM | POA: Diagnosis not present

## 2016-10-23 DIAGNOSIS — M6282 Rhabdomyolysis: Secondary | ICD-10-CM | POA: Insufficient documentation

## 2016-10-23 DIAGNOSIS — I69898 Other sequelae of other cerebrovascular disease: Secondary | ICD-10-CM | POA: Diagnosis not present

## 2016-10-23 DIAGNOSIS — Z853 Personal history of malignant neoplasm of breast: Secondary | ICD-10-CM | POA: Insufficient documentation

## 2016-10-23 DIAGNOSIS — R4701 Aphasia: Secondary | ICD-10-CM | POA: Diagnosis not present

## 2016-10-23 DIAGNOSIS — I10 Essential (primary) hypertension: Secondary | ICD-10-CM

## 2016-10-23 DIAGNOSIS — E119 Type 2 diabetes mellitus without complications: Secondary | ICD-10-CM | POA: Insufficient documentation

## 2016-10-23 DIAGNOSIS — E118 Type 2 diabetes mellitus with unspecified complications: Secondary | ICD-10-CM

## 2016-10-23 DIAGNOSIS — M1990 Unspecified osteoarthritis, unspecified site: Secondary | ICD-10-CM | POA: Insufficient documentation

## 2016-10-23 DIAGNOSIS — R251 Tremor, unspecified: Secondary | ICD-10-CM | POA: Diagnosis not present

## 2016-10-23 DIAGNOSIS — E039 Hypothyroidism, unspecified: Secondary | ICD-10-CM | POA: Diagnosis not present

## 2016-10-23 DIAGNOSIS — M6281 Muscle weakness (generalized): Secondary | ICD-10-CM | POA: Diagnosis not present

## 2016-10-23 DIAGNOSIS — R110 Nausea: Secondary | ICD-10-CM | POA: Diagnosis not present

## 2016-10-23 DIAGNOSIS — F419 Anxiety disorder, unspecified: Secondary | ICD-10-CM | POA: Insufficient documentation

## 2016-10-23 DIAGNOSIS — R41841 Cognitive communication deficit: Secondary | ICD-10-CM | POA: Diagnosis not present

## 2016-10-23 NOTE — Therapy (Signed)
[facility] [address] [place], [place], [address] Phone: [phone]   Fax:  [phone]  Physical Therapy Evaluation  Patient Details  Name: [name] MRN: [id number] Date of Birth: August 15, 1934 Referring Provider: Dr. Posey Pronto  Encounter Date: 10/22/2016      PT End of Session - 10/23/16 1009    Visit Number 1   Number of Visits 9   Date for PT Re-Evaluation 11/21/16   Authorization Type [employer] MEDICARE: G-CODE AND PROGRESS NOTE EVERY 10TH VISIT.   PT Start Time 1016   PT Stop Time 1101   PT Time Calculation (min) 45 min   Equipment Utilized During Treatment Gait belt   Activity Tolerance Patient tolerated treatment well   Behavior During Therapy WFL for tasks assessed/performed      Past Medical History:  Diagnosis Date  . Arthritis   . Cancer [facility])    breast cancer on left - 10  years ago  . Diabetes mellitus without complication ([place])   . GERD (gastroesophageal reflux disease)   . Hypertension   . Hypothyroidism   . Stroke ([place])    2014  . Wears glasses     Past Surgical History:  Procedure Laterality Date  . ABDOMINAL HYSTERECTOMY    . APPENDECTOMY    . ARCUATE KERATECTOMY    . BUNIONECTOMY     right  . CARPAL TUNNEL RELEASE     right  . CARPAL TUNNEL RELEASE Left 05/24/2014   Procedure: LEFT CARPAL TUNNEL RELEASE LEFT THUMB TRIGGER FINGER RELEASE;  Surgeon: Cammie Sickle, MD;  Location: [place];  Service: Orthopedics;  Laterality: Left;  left thumb also site of incision  . DEBRIDEMENT OF ABDOMINAL WALL ABSCESS  1978  . OVARIAN CYST SURGERY     x2-  . TONSILLECTOMY    . TRIGGER FINGER RELEASE Left 01/25/2016   Procedure: RELEASE A-1 PULLEY LEFT RING FINGER, LEFT SMALL FINGER;  Surgeon: Daryll Brod, MD;  Location: [place];  Service: Orthopedics;  Laterality: Left;  ANESTHESIA: IV REGIONAL FAB    There were no vitals filed for this visit.        Subjective Assessment - 10/22/16 1030    Subjective Pt recently discharged from inpt rehab on 10/16/16 s/p fall at home, which pt is unable to recall why she fell. Pt's sister found her face down at home and called EMS. Pt's chart states pt with hx of CVA but imaging negative for acute infarct. Pt's current dx is rhabdomyolsis and debility. Pt feels like she is back to baseline functioning, but is still careful when amb. over curbs and steps.  However, pt reports her "butt is sore", since being in the hospital.  Pt also gets cramps in feet and calf area.   Patient is accompained by: Family member  Bob: brother-in-law (left after 5 minutes)   Pertinent History CVA 2014, DM, HTN, hx of L breast CA (10 years ago per chart), hypothyroidism, depression, kidney disease per chart, arthritis   Patient Stated Goals "I don't know, I feel much better".    Currently in Pain? Yes   Pain Score 6    Pain Location Buttocks   Pain Orientation Right;Left   Pain Descriptors / Indicators Aching;Sore   Pain Onset In the past 7 days   Pain Frequency Intermittent   Aggravating Factors  sitting down and during sit to stand   Pain Relieving Factors standing  [facility] PT Assessment - 10/22/16 1036      Assessment   Medical Diagnosis Falls, cognitive, rhabdomyolysis, debility   Referring Provider Dr. Posey Pronto   Onset Date/Surgical Date 10/06/16   Hand Dominance Right   Prior Therapy Inpt rehab     Precautions   Precautions Fall   Precaution Comments based on FGA score and hx     Restrictions   Weight Bearing Restrictions No     Balance Screen   Has the patient fallen in the past 6 months Yes   How many times? 1   Has the patient had a decrease in activity level because of a fear of falling?  No   Is the patient reluctant to leave their home because of a fear of falling?  No     Home Environment   Living Environment Private residence   Living Arrangements Alone  husband has dementia and lives  at a SNF   Available Help at Discharge Family;Neighbor   Type of Home Other(Comment)   Home Access Level entry   Home Layout One level  [place] seat;Cane - single point;Grab bars - tub/shower     Prior Function   Level of Independence Independent   Vocation Retired   Leisure Pt walks dog with her neighbor     Cognition   Overall Cognitive Status History of cognitive impairments - at baseline  per pt chart     Sensation   Light Touch Impaired by gross assessment   Additional Comments Pt reports she feels more of a 'Deep touch" on R LE.     Coordination   Gross Motor Movements are Fluid and Coordinated Yes   Fine Motor Movements are Fluid and Coordinated No  decr. speed B during fingers to thumb   Heel Shin Test [facility]     Posture/Postural Control   Posture/Postural Control No significant limitations     ROM / Strength   AROM / PROM / Strength AROM;Strength     AROM   Overall AROM  Within functional limits for tasks performed   Overall AROM Comments BLE AROM WFL     Strength   Overall Strength Deficits   Overall Strength Comments PT assessed B hip abd/add in seated position: grossly 4/5, hip ext. not formally assessed but weakness suspected 2/2 gait deviations.   Right Hip Flexion 4-/5   Left Hip Flexion 4-/5   Right Knee Flexion 4+/5   Right Knee Extension 4+/5   Left Knee Flexion 4+/5   Left Knee Extension 4+/5   Right Ankle Dorsiflexion 4/5   Left Ankle Dorsiflexion 4/5     Transfers   Transfers Sit to Stand;Stand to Sit   Sit to Stand 7: Independent;Without upper extremity assist;From chair/3-in-1   Stand to Sit 7: Independent;Without upper extremity assist;To chair/3-in-1     Ambulation/Gait   Ambulation/Gait Yes   Ambulation/Gait Assistance 5: Supervision   Ambulation/Gait Assistance Details S to ensure safety   Ambulation Distance (Feet) 200 Feet   Assistive device None   Gait Pattern Step-through pattern;Decreased stride  length;Decreased stance time - left   Ambulation Surface Level;Indoor   Gait velocity 3.36ft/sec.  no AD     Standardized Balance Assessment   Standardized Balance Assessment Timed Up and Go Test     Timed Up and Go Test   TUG Normal TUG   Normal TUG (seconds) 10.3  no AD     Functional Gait  Assessment   Gait assessed  Yes   Gait Level Surface Walks 20 ft in less than 5.5 sec, no assistive devices, good speed, no evidence for imbalance, normal gait pattern, deviates no more than 6 in outside of the 12 in walkway width.  5.3 sec.   Change in Gait Speed Able to smoothly change walking speed without loss of balance or gait deviation. Deviate no more than 6 in outside of the 12 in walkway width.   Gait with Horizontal Head Turns Performs head turns smoothly with slight change in gait velocity (eg, minor disruption to smooth gait path), deviates 6-10 in outside 12 in walkway width, or uses an assistive device.   Gait with Vertical Head Turns Performs head turns with no change in gait. Deviates no more than 6 in outside 12 in walkway width.   Gait and Pivot Turn Pivot turns safely within 3 sec and stops quickly with no loss of balance.   Step Over Obstacle Is able to step over 2 stacked shoe boxes taped together (9 in total height) without changing gait speed. No evidence of imbalance.   Gait with Narrow Base of Support Ambulates 4-7 steps.   Gait with Eyes Closed Cannot walk 20 ft without assistance, severe gait deviations or imbalance, deviates greater than 15 in outside 12 in walkway width or will not attempt task.   Ambulating Backwards Walks 20 ft, uses assistive device, slower speed, mild gait deviations, deviates 6-10 in outside 12 in walkway width.   Steps Alternating feet, must use rail.  rail to descend   Total Score 22                           PT Education - 10/23/16 1008    Education provided Yes   Education Details PT discussed outcome measures and  frequency/duration.   Person(s) Educated Patient   Methods Explanation   Comprehension Verbalized understanding          PT Short Term Goals - 10/23/16 1014      PT SHORT TERM GOAL #1   Title same as LTGs           PT Long Term Goals - 10/23/16 1014      PT LONG TERM GOAL #1   Title Pt will verbalize understanding of fall prevention strategies. TARGET DATE FOR ALL LTGS: 11/19/16   Status New     PT LONG TERM GOAL #2   Title Pt will be IND in HEP to improve strength and balance.   Status New     PT LONG TERM GOAL #3   Title Pt will amb. 1000' over even/uneven terrain, IND, to improve functional mobility.    Status New     PT LONG TERM GOAL #4   Title Pt will improve FGA score to >/=28/30 to decr. falls risk.    Status New               Plan - 10/23/16 1010    Clinical Impression Statement Pt is a pleasant 82 y/o female presenting to OPPT neuro s/p falls and hospitalization for rhabdomyolysis and debility. Pt's PMH significant for the following: CVA 2014, DM, HTN, hx of L breast CA (10 years ago per chart), hypothyroidism, depression, kidney disease per chart, arthritis. Pt presented with the following impairments: gait deviations, impaired coordinaiton, impaired balance, impaired strength, impaired cognition, and buttock pain. PT will not directly address pain but will monitor closely. Pt's gait speed and TUG time are  WNL. However, pt's FGA score indicates pt is at medium risk for falls. Pt would benefit from skilled PT to improve safety and IND during functional mobility.    Rehab Potential Good   Clinical Impairments Affecting Rehab Potential co-morbidities and cognition   PT Frequency 2x / week   PT Duration 4 weeks   PT Treatment/Interventions ADLs/Self Care Home Management;Biofeedback;Canalith Repostioning;Cognitive remediation;Neuromuscular re-education;Balance training;Therapeutic exercise;Therapeutic activities;Functional mobility training;Stair  training;Gait training;DME Instruction;Orthotic Fit/Training;Patient/family education;Manual techniques;Vestibular   PT Next Visit Plan Provide pt with falls education handout. Initiate balance and hip strengthing HEP.   Consulted and Agree with Plan of Care Patient      Patient will benefit from skilled therapeutic intervention in order to improve the following deficits and impairments:  Abnormal gait, Pain, Decreased cognition, Decreased balance, Decreased mobility, Decreased coordination, Decreased strength  Visit Diagnosis: Other abnormalities of gait and mobility - Plan: PT plan of care cert/re-cert  Muscle weakness (generalized) - Plan: PT plan of care cert/re-cert      G-Codes - 0000000 1017    Functional Assessment Tool Used FGA: 22/30   Functional Limitation Mobility: Walking and moving around   Mobility: Walking and Moving Around Current Status [phone]) At least 40 percent but less than 60 percent impaired, limited or restricted   Mobility: Walking and Moving Around Goal Status [phone]) At least 1 percent but less than 20 percent impaired, limited or restricted       Problem List Patient Active Problem List   Diagnosis Date Noted  . Severe episode of recurrent major depressive disorder, without psychotic features ([place])   . Reactive depression   . Debilitated 10/09/2016  . Transient cerebral ischemia   . Anxiety state   . Controlled type 2 diabetes mellitus with complication, without long-term current use of insulin ([place])   . History of breast cancer   . Acute blood loss anemia   . Stage 2 chronic kidney disease   . Syncope 10/07/2016  . Stroke ([place]) 10/06/2016  . Stroke (cerebrum) ([place]) 10/06/2016  . Aphasia 10/06/2016  . Leukocytosis 10/06/2016  . Essential hypertension 10/06/2016  . Hypothyroidism 10/06/2016  . GERD (gastroesophageal reflux disease) 10/06/2016  . Hyperlipemia 10/06/2016    Senaya Dicenso L 10/23/2016, 10:19 AM  [facility] [address] [place], [place], [address] Phone: [phone]   Fax:  [phone]  Name: [name] MRN: [id number] Date of Birth: Jan 03, 1934  Geoffry Paradise, PT,DPT 10/23/16 10:19 AM Phone: [phone] Fax: [phone]

## 2016-10-23 NOTE — Progress Notes (Signed)
Subjective:    Patient ID: [name], female    DOB: 07/02/1934, 82 y.o.   MRN: [id number]  HPI 82 year old right-handed female with history of diabetes mellitus, hypertension, breast cancer, CVA with residual tremor on the right side presents for transitional care management after receiving CIR for rhabdomyolysis and multi-medical issues.   DATE OF ADMISSION:  10/09/2016 DISCHARGE DATE: 10/16/2016. Overall, pt states she is doing well.  Therapy is also pleased with her progress.  Her anxiety has improved on xanax.  She continues to take BP meds.  CBGs running 123-134.  She has an appointment to See Neurology in January.  She sees PCP Friday.    Mobility: Independent Therapies: 2/week DME: None needed  Pain Inventory Average Pain 1 Pain Right Now 4 My pain is aching  In the last 24 hours, has pain interfered with the following? General activity 0 Relation with others 0 Enjoyment of life 0 What TIME of day is your pain at its worst? evening Sleep (in general) Good  Pain is worse with: sitting and standing Pain improves with: rest Relief from Meds: 0  Mobility walk without assistance ability to climb steps?  yes do you drive?  no  Function retired  Neuro/Psych No problems in this area  Prior Studies Any changes since last visit?  no  Physicians involved in your care Any changes since last visit?  no   No family history on file. Social History   Social History  . Marital status: Married    Spouse name: N/A  . Number of children: N/A  . Years of education: N/A   Social History Main Topics  . Smoking status: Former Smoker    Types: Cigarettes  . Smokeless tobacco: Never Used     Comment: Quit smoking 25 years agl  . Alcohol use 0.0 oz/week     Comment: Occassionally glass of wine  . Drug use: No  . Sexual activity: Not Asked   Other Topics Concern  . None   Social History Narrative  . None   Past Surgical History:  Procedure Laterality Date    . ABDOMINAL HYSTERECTOMY    . APPENDECTOMY    . ARCUATE KERATECTOMY    . BUNIONECTOMY     right  . CARPAL TUNNEL RELEASE     right  . CARPAL TUNNEL RELEASE Left 05/24/2014   Procedure: LEFT CARPAL TUNNEL RELEASE LEFT THUMB TRIGGER FINGER RELEASE;  Surgeon: Cammie Sickle, MD;  Location: [facility];  Service: Orthopedics;  Laterality: Left;  left thumb also site of incision  . DEBRIDEMENT OF ABDOMINAL WALL ABSCESS  1978  . OVARIAN CYST SURGERY     x2-  . TONSILLECTOMY    . TRIGGER FINGER RELEASE Left 01/25/2016   Procedure: RELEASE A-1 PULLEY LEFT RING FINGER, LEFT SMALL FINGER;  Surgeon: Daryll Brod, MD;  Location: [place];  Service: Orthopedics;  Laterality: Left;  ANESTHESIA: IV REGIONAL FAB   Past Medical History:  Diagnosis Date  . Arthritis   . Cancer [facility])    breast cancer on left - 10  years ago  . Diabetes mellitus without complication ([place])   . GERD (gastroesophageal reflux disease)   . Hypertension   . Hypothyroidism   . Stroke ([place])    2014  . Wears glasses    BP (!) 152/68 (BP Location: Left Arm, Patient Position: Sitting, Cuff Size: Normal)   Pulse 70   SpO2 97%   Opioid Risk Score:  Fall Risk Score:  `1  Depression screen PHQ 2/9  No flowsheet data found. Review of Systems  Constitutional: Negative.   HENT: Negative.   Eyes: Negative.   Respiratory: Negative.   Cardiovascular: Negative.   Gastrointestinal: Negative.   Endocrine: Negative.   Genitourinary: Positive for difficulty urinating.  Musculoskeletal: Positive for gait problem.  Skin: Negative.   Allergic/Immunologic: Negative.   Hematological: Negative.   Psychiatric/Behavioral: Negative.   All other systems reviewed and are negative.     Objective:   Physical Exam Gen: No acute distress. Vital signs reviewed. Well-developed.  HENT: [place]. Bruises healed Eyes: EOMI. No discharge. Cardiovascular: RRR.  No JVD. Respiratory: CTA B. Unlabored GI: Soft.  BS+, NT,ND.  Musculoskeletal: She exhibits no edema or tenderness.  Neurological: She is alert and oriented x3. Motor: 5/5 throughout bilateral upper and lower ext.  Skin: Intact. Warm and dry.  Psychiatric: Normal affect. Normal behavior. Positive.     Assessment & Plan:  82 year old right-handed female with history of diabetes mellitus, hypertension, breast cancer, CVA with residual tremor on the right side presents for transitional care management after receiving CIR for rhabdomyolysis and multi-medical issues.    1. Debilitation secondary to rhabdomyolysis/multi-medical  Cont therapy  Cont to follow up Neurology  Overall improving  2. Anxiety  Will wean wean freq of xanax BID x1 week, daily x 1 week, prn  Cont Fluoxetine after discussion with Neuropsych   3. Hypertension  Cont meds  Follow with PCP  4. Diabetes mellitus.   Cont meds  5. Nausea  Significantly improved  Pt to check CBG when episode begins

## 2016-10-25 DIAGNOSIS — Z7984 Long term (current) use of oral hypoglycemic drugs: Secondary | ICD-10-CM | POA: Diagnosis not present

## 2016-10-25 DIAGNOSIS — F321 Major depressive disorder, single episode, moderate: Secondary | ICD-10-CM | POA: Diagnosis not present

## 2016-10-25 DIAGNOSIS — I129 Hypertensive chronic kidney disease with stage 1 through stage 4 chronic kidney disease, or unspecified chronic kidney disease: Secondary | ICD-10-CM | POA: Diagnosis not present

## 2016-10-25 DIAGNOSIS — Z79899 Other long term (current) drug therapy: Secondary | ICD-10-CM | POA: Diagnosis not present

## 2016-10-25 DIAGNOSIS — N183 Chronic kidney disease, stage 3 (moderate): Secondary | ICD-10-CM | POA: Diagnosis not present

## 2016-10-25 DIAGNOSIS — E1121 Type 2 diabetes mellitus with diabetic nephropathy: Secondary | ICD-10-CM | POA: Diagnosis not present

## 2016-10-25 DIAGNOSIS — E11319 Type 2 diabetes mellitus with unspecified diabetic retinopathy without macular edema: Secondary | ICD-10-CM | POA: Diagnosis not present

## 2016-10-25 DIAGNOSIS — E7800 Pure hypercholesterolemia, unspecified: Secondary | ICD-10-CM | POA: Diagnosis not present

## 2016-10-31 ENCOUNTER — Ambulatory Visit: Payer: Medicare Other | Attending: Physical Medicine & Rehabilitation | Admitting: Occupational Therapy

## 2016-10-31 DIAGNOSIS — M6281 Muscle weakness (generalized): Secondary | ICD-10-CM | POA: Insufficient documentation

## 2016-10-31 DIAGNOSIS — I69818 Other symptoms and signs involving cognitive functions following other cerebrovascular disease: Secondary | ICD-10-CM | POA: Diagnosis not present

## 2016-10-31 NOTE — Patient Instructions (Signed)

## 2016-10-31 NOTE — Therapy (Signed)
[facility] [address] [place], [place], [address] Phone: [phone]   Fax:  [phone]  Occupational Therapy Treatment  Patient Details  Name: [name] MRN: [id number] Date of Birth: Sep 25, 1934 Referring Provider: Dr. Delice Lesch  Encounter Date: 10/31/2016      OT End of Session - 10/31/16 1544    Visit Number 2   Number of Visits 8   Authorization Type MCR - G code needed   Authorization - Visit Number 2   Authorization - Number of Visits 10   OT Start Time [id number]   OT Stop Time 1535   OT Time Calculation (min) 47 min   Activity Tolerance Patient tolerated treatment well      Past Medical History:  Diagnosis Date  . Arthritis   . Cancer [facility])    breast cancer on left - 10  years ago  . Diabetes mellitus without complication ([place])   . GERD (gastroesophageal reflux disease)   . Hypertension   . Hypothyroidism   . Stroke ([place])    2014  . Wears glasses     Past Surgical History:  Procedure Laterality Date  . ABDOMINAL HYSTERECTOMY    . APPENDECTOMY    . ARCUATE KERATECTOMY    . BUNIONECTOMY     right  . CARPAL TUNNEL RELEASE     right  . CARPAL TUNNEL RELEASE Left 05/24/2014   Procedure: LEFT CARPAL TUNNEL RELEASE LEFT THUMB TRIGGER FINGER RELEASE;  Surgeon: Cammie Sickle, MD;  Location: [place];  Service: Orthopedics;  Laterality: Left;  left thumb also site of incision  . DEBRIDEMENT OF ABDOMINAL WALL ABSCESS  1978  . OVARIAN CYST SURGERY     x2-  . TONSILLECTOMY    . TRIGGER FINGER RELEASE Left 01/25/2016   Procedure: RELEASE A-1 PULLEY LEFT RING FINGER, LEFT SMALL FINGER;  Surgeon: Daryll Brod, MD;  Location: [facility];  Service: Orthopedics;  Laterality: Left;  ANESTHESIA: IV REGIONAL FAB    There were no vitals filed for this visit.      Subjective Assessment - 10/31/16 1457    Pertinent History h/o CVA, microvascular dz, ICA aneurysm,  rhabdomyolysis   Currently in Pain? No/denies                      OT Treatments/Exercises (OP) - 10/31/16 0001      ADLs   Bathing Discussed using shower seat when showering for safety/fall prevention prn   ADL Comments Pt issued memory compensatory strategies and discussed how to implement. Pt reports she is already using a calendar I'ly and managing medications with supervision.  Pt also had questions re: return to driving (family not present today for education) however discussed concerns for return to driving secondary to vascular changes in brain, decreased processing speed and reaction time and general safety concerns. If pt/family wish to pursue further, would recommend discussing with MD and having driving evaluation                OT Education - 10/31/16 1500    Education provided Yes   Education Details memory strategies   Person(s) Educated Patient   Methods Explanation   Comprehension Verbalized understanding             OT Long Term Goals - 10/22/16 1430      OT LONG TERM GOAL #1   Title Pt/family to verbalize understanding with safety considerations if pt  continues to live alone (including possible need for [employer], need for supervision with cooking and/or providing meals, etc)    Time 4   Period Weeks   Status New     OT LONG TERM GOAL #2   Title Pt to verbalize understanding with memory compensatory strategies and how to implement at home   Time 4   Period Weeks   Status New     OT LONG TERM GOAL #3   Title Pt to demo simple familiar cooking task at supervision level safely and/or simple cold prep at mod I level   Time 4   Period Weeks   Status New     OT LONG TERM GOAL #4   Title Assess and address prn DME needs for safety in shower   Time 4   Period Weeks   Status New               Plan - 10/31/16 1545    Clinical Impression Statement Pt demo good understanding of memory compensatory strategies and already  implements several of these strategies.    Rehab Potential Good   OT Frequency 2x / week   OT Duration 4 weeks   OT Treatment/Interventions Self-care/ADL training;DME and/or AE instruction;Patient/family education;Therapeutic exercise;Functional Mobility Training;Cognitive remediation/compensation;Therapeutic activities   Plan If family present - review recommendations and see if cooking is a goal for pt and family, if not consider d/c from O.T.  If family not present - assess safety with cooking to see if pt can be independent with this (anticipate pt will need sup secondary to fluctuating mild confusion at times)   Consulted and Agree with Plan of Care Patient      Patient will benefit from skilled therapeutic intervention in order to improve the following deficits and impairments:  Decreased endurance, Decreased safety awareness, Decreased knowledge of precautions, Decreased activity tolerance, Decreased knowledge of use of DME, Decreased cognition, Decreased mobility, Impaired perceived functional ability  Visit Diagnosis: Other symptoms and signs involving cognitive functions following other cerebrovascular disease  Muscle weakness (generalized)    Problem List Patient Active Problem List   Diagnosis Date Noted  . Generalized anxiety disorder 10/23/2016  . Severe episode of recurrent major depressive disorder, without psychotic features ([place])   . Reactive depression   . Debilitated 10/09/2016  . Transient cerebral ischemia   . Anxiety state   . Controlled type 2 diabetes mellitus with complication, without long-term current use of insulin ([employer])   . History of breast cancer   . Acute blood loss anemia   . Stage 2 chronic kidney disease   . Syncope 10/07/2016  . Stroke ([place]) 10/06/2016  . Stroke (cerebrum) ([place]) 10/06/2016  . Aphasia 10/06/2016  . Leukocytosis 10/06/2016  . Essential hypertension 10/06/2016  . Hypothyroidism 10/06/2016  . GERD (gastroesophageal reflux  disease) 10/06/2016  . Hyperlipemia 10/06/2016    Carey Bullocks, OTR/L 10/31/2016, 3:49 PM  [facility] [address] [place], [place], [address] Phone: [phone]   Fax:  [phone]  Name: [name] MRN: [id number] Date of Birth: July 31, 1934

## 2016-11-04 ENCOUNTER — Ambulatory Visit: Payer: Medicare Other | Admitting: Physical Therapy

## 2016-11-04 ENCOUNTER — Ambulatory Visit: Payer: Medicare Other | Admitting: Occupational Therapy

## 2016-11-07 ENCOUNTER — Ambulatory Visit: Payer: Medicare Other | Admitting: Physical Therapy

## 2016-11-07 ENCOUNTER — Encounter: Payer: Medicare Other | Admitting: Occupational Therapy

## 2016-11-21 ENCOUNTER — Encounter (HOSPITAL_COMMUNITY): Payer: Self-pay | Admitting: Emergency Medicine

## 2016-11-21 ENCOUNTER — Inpatient Hospital Stay (HOSPITAL_COMMUNITY)
Admission: EM | Admit: 2016-11-21 | Discharge: 2016-11-24 | DRG: 103 | Disposition: A | Discharge status: Home Health | Payer: Medicare Other | Attending: Internal Medicine | Admitting: Internal Medicine

## 2016-11-21 ENCOUNTER — Ambulatory Visit: Payer: Medicare Other

## 2016-11-21 ENCOUNTER — Encounter: Payer: Medicare Other | Admitting: Occupational Therapy

## 2016-11-21 ENCOUNTER — Emergency Department (HOSPITAL_COMMUNITY): Payer: Medicare Other

## 2016-11-21 DIAGNOSIS — I10 Essential (primary) hypertension: Secondary | ICD-10-CM | POA: Diagnosis present

## 2016-11-21 DIAGNOSIS — R51 Headache: Secondary | ICD-10-CM | POA: Diagnosis not present

## 2016-11-21 DIAGNOSIS — Z8249 Family history of ischemic heart disease and other diseases of the circulatory system: Secondary | ICD-10-CM

## 2016-11-21 DIAGNOSIS — S0003XA Contusion of scalp, initial encounter: Secondary | ICD-10-CM | POA: Diagnosis not present

## 2016-11-21 DIAGNOSIS — Z7982 Long term (current) use of aspirin: Secondary | ICD-10-CM

## 2016-11-21 DIAGNOSIS — R001 Bradycardia, unspecified: Secondary | ICD-10-CM | POA: Diagnosis not present

## 2016-11-21 DIAGNOSIS — Z880 Allergy status to penicillin: Secondary | ICD-10-CM

## 2016-11-21 DIAGNOSIS — N182 Chronic kidney disease, stage 2 (mild): Secondary | ICD-10-CM | POA: Diagnosis present

## 2016-11-21 DIAGNOSIS — S199XXA Unspecified injury of neck, initial encounter: Secondary | ICD-10-CM | POA: Diagnosis not present

## 2016-11-21 DIAGNOSIS — S0990XA Unspecified injury of head, initial encounter: Secondary | ICD-10-CM | POA: Diagnosis not present

## 2016-11-21 DIAGNOSIS — E118 Type 2 diabetes mellitus with unspecified complications: Secondary | ICD-10-CM | POA: Diagnosis not present

## 2016-11-21 DIAGNOSIS — E1122 Type 2 diabetes mellitus with diabetic chronic kidney disease: Secondary | ICD-10-CM | POA: Diagnosis not present

## 2016-11-21 DIAGNOSIS — Z8673 Personal history of transient ischemic attack (TIA), and cerebral infarction without residual deficits: Secondary | ICD-10-CM

## 2016-11-21 DIAGNOSIS — R112 Nausea with vomiting, unspecified: Secondary | ICD-10-CM | POA: Diagnosis not present

## 2016-11-21 DIAGNOSIS — S0181XA Laceration without foreign body of other part of head, initial encounter: Secondary | ICD-10-CM | POA: Diagnosis not present

## 2016-11-21 DIAGNOSIS — F0781 Postconcussional syndrome: Secondary | ICD-10-CM | POA: Diagnosis not present

## 2016-11-21 DIAGNOSIS — Z886 Allergy status to analgesic agent status: Secondary | ICD-10-CM

## 2016-11-21 DIAGNOSIS — K219 Gastro-esophageal reflux disease without esophagitis: Secondary | ICD-10-CM | POA: Diagnosis present

## 2016-11-21 DIAGNOSIS — Z87891 Personal history of nicotine dependence: Secondary | ICD-10-CM

## 2016-11-21 DIAGNOSIS — Z79899 Other long term (current) drug therapy: Secondary | ICD-10-CM

## 2016-11-21 DIAGNOSIS — S098XXA Other specified injuries of head, initial encounter: Secondary | ICD-10-CM | POA: Diagnosis not present

## 2016-11-21 DIAGNOSIS — I129 Hypertensive chronic kidney disease with stage 1 through stage 4 chronic kidney disease, or unspecified chronic kidney disease: Secondary | ICD-10-CM | POA: Diagnosis present

## 2016-11-21 DIAGNOSIS — R42 Dizziness and giddiness: Secondary | ICD-10-CM | POA: Diagnosis present

## 2016-11-21 DIAGNOSIS — W0110XA Fall on same level from slipping, tripping and stumbling with subsequent striking against unspecified object, initial encounter: Secondary | ICD-10-CM | POA: Diagnosis present

## 2016-11-21 DIAGNOSIS — E785 Hyperlipidemia, unspecified: Secondary | ICD-10-CM | POA: Diagnosis present

## 2016-11-21 DIAGNOSIS — M542 Cervicalgia: Secondary | ICD-10-CM | POA: Diagnosis not present

## 2016-11-21 DIAGNOSIS — Z888 Allergy status to other drugs, medicaments and biological substances status: Secondary | ICD-10-CM

## 2016-11-21 DIAGNOSIS — W19XXXA Unspecified fall, initial encounter: Secondary | ICD-10-CM

## 2016-11-21 DIAGNOSIS — Z853 Personal history of malignant neoplasm of breast: Secondary | ICD-10-CM

## 2016-11-21 DIAGNOSIS — D649 Anemia, unspecified: Secondary | ICD-10-CM | POA: Diagnosis present

## 2016-11-21 DIAGNOSIS — Z885 Allergy status to narcotic agent status: Secondary | ICD-10-CM

## 2016-11-21 DIAGNOSIS — Z7984 Long term (current) use of oral hypoglycemic drugs: Secondary | ICD-10-CM

## 2016-11-21 DIAGNOSIS — E039 Hypothyroidism, unspecified: Secondary | ICD-10-CM | POA: Diagnosis present

## 2016-11-21 DIAGNOSIS — S060XAA Concussion with loss of consciousness status unknown, initial encounter: Secondary | ICD-10-CM | POA: Diagnosis present

## 2016-11-21 DIAGNOSIS — S060X9A Concussion with loss of consciousness of unspecified duration, initial encounter: Secondary | ICD-10-CM | POA: Diagnosis present

## 2016-11-21 DIAGNOSIS — Z90710 Acquired absence of both cervix and uterus: Secondary | ICD-10-CM

## 2016-11-21 LAB — CBC WITH DIFFERENTIAL/PLATELET
BASOS ABS: 0 10*3/uL (ref 0.0–0.1)
BASOS PCT: 0 %
Eosinophils Absolute: 0 10*3/uL (ref 0.0–0.7)
Eosinophils Relative: 0 %
HEMATOCRIT: 32.6 % — AB (ref 36.0–46.0)
HEMOGLOBIN: 11.3 g/dL — AB (ref 12.0–15.0)
Lymphocytes Relative: 13 %
Lymphs Abs: 0.8 10*3/uL (ref 0.7–4.0)
MCH: 32.8 pg (ref 26.0–34.0)
MCHC: 34.7 g/dL (ref 30.0–36.0)
MCV: 94.8 fL (ref 78.0–100.0)
Monocytes Absolute: 0.8 10*3/uL (ref 0.1–1.0)
Monocytes Relative: 13 %
NEUTROS ABS: 4.4 10*3/uL (ref 1.7–7.7)
Neutrophils Relative %: 74 %
Platelets: 191 10*3/uL (ref 150–400)
RBC: 3.44 MIL/uL — AB (ref 3.87–5.11)
RDW: 12.8 % (ref 11.5–15.5)
WBC: 6 10*3/uL (ref 4.0–10.5)

## 2016-11-21 LAB — HEPATIC FUNCTION PANEL
ALBUMIN: 3.6 g/dL (ref 3.5–5.0)
ALK PHOS: 59 U/L (ref 38–126)
ALT: 22 U/L (ref 14–54)
AST: 28 U/L (ref 15–41)
BILIRUBIN TOTAL: 0.7 mg/dL (ref 0.3–1.2)
Bilirubin, Direct: 0.2 mg/dL (ref 0.1–0.5)
Indirect Bilirubin: 0.5 mg/dL (ref 0.3–0.9)
Total Protein: 6.7 g/dL (ref 6.5–8.1)

## 2016-11-21 LAB — BASIC METABOLIC PANEL
ANION GAP: 8 (ref 5–15)
BUN: 13 mg/dL (ref 6–20)
CHLORIDE: 101 mmol/L (ref 101–111)
CO2: 24 mmol/L (ref 22–32)
Calcium: 8.9 mg/dL (ref 8.9–10.3)
Creatinine, Ser: 0.94 mg/dL (ref 0.44–1.00)
GFR calc non Af Amer: 56 mL/min — ABNORMAL LOW (ref >60)
Glucose, Bld: 135 mg/dL — ABNORMAL HIGH (ref 65–99)
Potassium: 4.1 mmol/L (ref 3.5–5.1)
Sodium: 133 mmol/L — ABNORMAL LOW (ref 135–145)

## 2016-11-21 MED ORDER — ONDANSETRON HCL 4 MG PO TABS: 4.0000 mg | ORAL_TABLET | Freq: Four times a day (QID) | ORAL | Status: DC | PRN

## 2016-11-21 MED ORDER — ONDANSETRON HCL 4 MG/2ML IJ SOLN
4.0000 mg | Freq: Four times a day (QID) | INTRAMUSCULAR | Status: DC | PRN
  Administered 2016-11-22 – 2016-11-23 (×2): 4 mg via INTRAVENOUS
  Filled 2016-11-21 (×3): qty 2

## 2016-11-21 MED ORDER — FAMOTIDINE 20 MG PO TABS
20.0000 mg | ORAL_TABLET | Freq: Every day | ORAL | Status: DC
  Administered 2016-11-22 – 2016-11-24 (×3): 20 mg via ORAL
  Filled 2016-11-21 (×3): qty 1

## 2016-11-21 MED ORDER — ALPRAZOLAM 0.25 MG PO TABS
0.2500 mg | ORAL_TABLET | Freq: Three times a day (TID) | ORAL | Status: DC
  Administered 2016-11-22 – 2016-11-24 (×8): 0.25 mg via ORAL
  Filled 2016-11-21 (×8): qty 1

## 2016-11-21 MED ORDER — ATORVASTATIN CALCIUM 40 MG PO TABS
40.0000 mg | ORAL_TABLET | Freq: Every day | ORAL | Status: DC
  Administered 2016-11-22 – 2016-11-23 (×3): 40 mg via ORAL
  Filled 2016-11-21 (×3): qty 1

## 2016-11-21 MED ORDER — INSULIN ASPART 100 UNIT/ML ~~LOC~~ SOLN
0.0000 [IU] | Freq: Three times a day (TID) | SUBCUTANEOUS | Status: DC
  Administered 2016-11-24: 1 [IU] via SUBCUTANEOUS

## 2016-11-21 MED ORDER — ACETAMINOPHEN 325 MG PO TABS
650.0000 mg | ORAL_TABLET | Freq: Four times a day (QID) | ORAL | Status: DC | PRN
  Administered 2016-11-22 – 2016-11-24 (×2): 650 mg via ORAL
  Filled 2016-11-21 (×2): qty 2

## 2016-11-21 MED ORDER — FELODIPINE ER 5 MG PO TB24
5.0000 mg | ORAL_TABLET | Freq: Every day | ORAL | Status: DC
Start: 2016-11-22 — End: 2016-11-24
  Administered 2016-11-22 – 2016-11-24 (×3): 5 mg via ORAL
  Filled 2016-11-21 (×3): qty 1

## 2016-11-21 MED ORDER — LEVOTHYROXINE SODIUM 50 MCG PO TABS
50.0000 ug | ORAL_TABLET | Freq: Every day | ORAL | Status: DC
Start: 2016-11-22 — End: 2016-11-24
  Administered 2016-11-22 – 2016-11-24 (×3): 50 ug via ORAL
  Filled 2016-11-21 (×3): qty 1

## 2016-11-21 MED ORDER — ACETAMINOPHEN 650 MG RE SUPP: 650.0000 mg | Freq: Four times a day (QID) | RECTAL | Status: DC | PRN

## 2016-11-21 MED ORDER — ONDANSETRON HCL 4 MG/2ML IJ SOLN
4.0000 mg | Freq: Once | INTRAMUSCULAR | Status: AC
  Administered 2016-11-21: 4 mg via INTRAVENOUS
  Filled 2016-11-21: qty 2

## 2016-11-21 MED ORDER — LOSARTAN POTASSIUM 50 MG PO TABS
100.0000 mg | ORAL_TABLET | Freq: Every day | ORAL | Status: DC
  Administered 2016-11-22 – 2016-11-24 (×3): 100 mg via ORAL
  Filled 2016-11-21 (×3): qty 2

## 2016-11-21 MED ORDER — SODIUM CHLORIDE 0.9 % IV SOLN
INTRAVENOUS | Status: AC
  Administered 2016-11-22 (×3): via INTRAVENOUS

## 2016-11-21 MED ORDER — FLUOXETINE HCL 10 MG PO CAPS
10.0000 mg | ORAL_CAPSULE | Freq: Every day | ORAL | Status: DC
  Administered 2016-11-22 – 2016-11-24 (×3): 10 mg via ORAL
  Filled 2016-11-21 (×3): qty 1

## 2016-11-21 MED ORDER — SODIUM CHLORIDE 0.9 % IV BOLUS (SEPSIS)
1000.0000 mL | Freq: Once | INTRAVENOUS | Status: AC
  Administered 2016-11-21: 1000 mL via INTRAVENOUS

## 2016-11-21 NOTE — ED Notes (Signed)
Pt given saltine crackers. 

## 2016-11-21 NOTE — ED Notes (Signed)
Pt now stating she cannot remember falling.  Will place syncope orders

## 2016-11-21 NOTE — ED Notes (Signed)
Patient transported to CT 

## 2016-11-21 NOTE — H&P (Addendum)
History and Physical    [name] [id number] DOB: [birth year] DOA: 11/21/2016  PCP: Mathews Argyle, MD  Patient coming from: Home.  Chief Complaint: Fall.  HPI: [name] is a 82 y.o. female with history of diabetes mellitus type 2, hypertension, hyperlipidemia and hypothyroidism with history of TIA diagnosed in October this year presents to the ER because of fall. Patient states she missed her step while getting water from the refrigerator and hit her head on the floor. Did not lose consciousness but started bleeding from the scalp. EMS was called and patient was brought to the ER. On exam patient has large scalp hematoma on the posterior aspect. CT head did not show any intracranial bleed. Patient had at least 2 episodes of vomiting since episode and also has difficult gait and is admitted for postconcussive syndrome. Patient has mild headache also. On exam patient appears nonfocal.   ED Course: CT head did not show any intracranial bleed. CT C-spine did not show any fractures. EKG shows sinus bradycardia.  Review of Systems: As per HPI, rest all negative.   Past Medical History:  Diagnosis Date  . Arthritis   . Cancer [facility])    breast cancer on left - 10  years ago  . Diabetes mellitus without complication ([place])   . GERD (gastroesophageal reflux disease)   . Hypertension   . Hypothyroidism   . Stroke ([place])    2014  . Wears glasses     Past Surgical History:  Procedure Laterality Date  . ABDOMINAL HYSTERECTOMY    . APPENDECTOMY    . ARCUATE KERATECTOMY    . BUNIONECTOMY     right  . CARPAL TUNNEL RELEASE     right  . CARPAL TUNNEL RELEASE Left 05/24/2014   Procedure: LEFT CARPAL TUNNEL RELEASE LEFT THUMB TRIGGER FINGER RELEASE;  Surgeon: Cammie Sickle, MD;  Location: [place];  Service: Orthopedics;  Laterality: Left;  left thumb also site of incision  . DEBRIDEMENT OF ABDOMINAL WALL ABSCESS  1978  . OVARIAN CYST SURGERY     x2-    . TONSILLECTOMY    . TRIGGER FINGER RELEASE Left 01/25/2016   Procedure: RELEASE A-1 PULLEY LEFT RING FINGER, LEFT SMALL FINGER;  Surgeon: Daryll Brod, MD;  Location: [place];  Service: Orthopedics;  Laterality: Left;  ANESTHESIA: IV REGIONAL FAB     reports that she has quit smoking. Her smoking use included Cigarettes. She has never used smokeless tobacco. She reports that she drinks alcohol. She reports that she does not use drugs.  Allergies  Allergen Reactions  . Tramadol Anaphylaxis  . Morphine And Related Nausea And Vomiting  . Penicillins Swelling  . Tetracyclines & Related Swelling    Face and eyes swells  . Hydrocodone-Acetaminophen Other (See Comments)  . Sitagliptin Other (See Comments)    Family History  Problem Relation Age of Onset  . Hypertension Other     Prior to Admission medications   Medication Sig Start Date End Date Taking? Authorizing Provider  acetaminophen (TYLENOL) 325 MG tablet Take 1 tablet (325 mg total) by mouth every 6 (six) hours as needed for mild pain, fever or headache. 10/16/16  Yes Ivan Anchors Love, PA-C  ALPRAZolam (XANAX) 0.25 MG tablet Take 0.25 mg by mouth 3 (three) times daily.   Yes Historical Provider, MD  aspirin 325 MG tablet Take 1 tablet (325 mg total) by mouth daily. 10/09/16  Yes Geradine Girt, DO  atorvastatin (LIPITOR) 40 MG tablet Take 1 tablet (40 mg total) by mouth daily at 6 PM. 10/09/16  Yes Geradine Girt, DO  betamethasone valerate lotion (VALISONE) 0.1 % Apply 1 application topically daily as needed for irritation. 10/24/16  Yes Historical Provider, MD  famotidine (PEPCID) 20 MG tablet Take 20 mg by mouth daily.   Yes Historical Provider, MD  felodipine (PLENDIL) 5 MG 24 hr tablet Take 5 mg by mouth daily.   Yes Historical Provider, MD  FLUoxetine (PROZAC) 10 MG capsule Take 1 capsule (10 mg total) by mouth daily. 10/17/16  Yes Ivan Anchors Love, PA-C  levothyroxine (SYNTHROID, LEVOTHROID) 50 MCG tablet Take 50  mcg by mouth daily before breakfast.   Yes Historical Provider, MD  losartan (COZAAR) 100 MG tablet Take 1 tablet (100 mg total) by mouth daily. 10/16/16  Yes Ivan Anchors Love, PA-C  metFORMIN (GLUCOPHAGE-XR) 750 MG 24 hr tablet Take 750 mg by mouth at bedtime. 11/19/16  Yes Historical Provider, MD    Physical Exam: Vitals:   11/21/16 2030 11/21/16 2041 11/21/16 2100 11/21/16 2143  BP: 105/66 90/59    Pulse: 74 69 60   Resp:      Temp:    98 F (36.7 C)  SpO2: 95% 95% 99%       Constitutional: Moderately built and nourished. Vitals:   11/21/16 2030 11/21/16 2041 11/21/16 2100 11/21/16 2143  BP: 105/66 90/59    Pulse: 74 69 60   Resp:      Temp:    98 F (36.7 C)  SpO2: 95% 95% 99%    Eyes: Anicteric no pallor. ENMT: No discharge from the ears eyes nose or mouth. Neck: No mass felt. No neck rigidity. Respiratory: No rhonchi or crepitations. Cardiovascular: S1-S2 heard. No murmurs appreciated. Abdomen: Soft nontender bowel sounds present. Musculoskeletal: No edema. No joint effusion. Skin: Scalp hematoma. Neurologic: Alert awake oriented to time place and person. Moves all extremities 5 x 5. No facial asymmetry. Tongue is midline. Pupils equal and reacting. Psychiatric: Appears normal. Normal affect.   Labs on Admission: I have personally reviewed following labs and imaging studies  CBC:  Recent Labs Lab 11/21/16 2229  WBC 6.0  NEUTROABS 4.4  HGB 11.3*  HCT 32.6*  MCV 94.8  PLT 99991111   Basic Metabolic Panel:  Recent Labs Lab 11/21/16 2229  NA 133*  K 4.1  CL 101  CO2 24  GLUCOSE 135*  BUN 13  CREATININE 0.94  CALCIUM 8.9   GFR: CrCl cannot be calculated (Unknown ideal weight.). Liver Function Tests:  Recent Labs Lab 11/21/16 2229  AST 28  ALT 22  ALKPHOS 59  BILITOT 0.7  PROT 6.7  ALBUMIN 3.6   No results for input(s): LIPASE, AMYLASE in the last 168 hours. No results for input(s): AMMONIA in the last 168 hours. Coagulation Profile: No  results for input(s): INR, PROTIME in the last 168 hours. Cardiac Enzymes: No results for input(s): CKTOTAL, CKMB, CKMBINDEX, TROPONINI in the last 168 hours. BNP (last 3 results) No results for input(s): PROBNP in the last 8760 hours. HbA1C: No results for input(s): HGBA1C in the last 72 hours. CBG: No results for input(s): GLUCAP in the last 168 hours. Lipid Profile: No results for input(s): CHOL, HDL, LDLCALC, TRIG, CHOLHDL, LDLDIRECT in the last 72 hours. Thyroid Function Tests: No results for input(s): TSH, T4TOTAL, FREET4, T3FREE, THYROIDAB in the last 72 hours. Anemia Panel: No results for input(s): VITAMINB12, FOLATE, FERRITIN, TIBC, IRON, RETICCTPCT in  the last 72 hours. Urine analysis:    Component Value Date/Time   COLORURINE YELLOW 10/06/2016 [place] 10/06/2016 1535   LABSPEC 1.015 10/06/2016 1535   PHURINE 6.0 10/06/2016 1535   GLUCOSEU >1000 (A) 10/06/2016 1535   HGBUR TRACE (A) 10/06/2016 1535   BILIRUBINUR NEGATIVE 10/06/2016 1535   KETONESUR 15 (A) 10/06/2016 1535   PROTEINUR 100 (A) 10/06/2016 1535   NITRITE NEGATIVE 10/06/2016 1535   LEUKOCYTESUR NEGATIVE 10/06/2016 1535   Sepsis Labs: @LABRCNTIP (procalcitonin:4,lacticidven:4) )No results found for this or any previous visit (from the past 240 hour(s)).   Radiological Exams on Admission: Ct Head Wo Contrast  Result Date: 11/21/2016 CLINICAL DATA:  Pt fell while in kitchen and hit head on corner of refrigerator. Pt states hit side and back of head. Pt has bruising right frontal. Pt denies headache. Pt has some dizziness. Pt takes blood thinner. Pt denies neck pain. EXAM: CT HEAD WITHOUT CONTRAST CT CERVICAL SPINE WITHOUT CONTRAST TECHNIQUE: Multidetector CT imaging of the head and cervical spine was performed following the standard protocol without intravenous contrast. Multiplanar CT image reconstructions of the cervical spine were also generated. COMPARISON:  Brain MRI, 10/07/2016.  CT,  10/06/2016. FINDINGS: CT HEAD FINDINGS Brain: No evidence of acute infarction, hemorrhage, hydrocephalus, extra-axial collection or mass lesion/mass effect. The ventricles and sulci are enlarged reflecting mild to moderate generalized atrophy. Patchy white matter hypoattenuation is noted consistent with mild chronic microvascular ischemic change. Vascular: No hyperdense vessel or unexpected calcification. Skull: Normal. Negative for fracture or focal lesion. Sinuses/Orbits: No acute finding. Other: Right parietal scalp hematoma. CT CERVICAL SPINE FINDINGS Alignment: Normal. Skull base and vertebrae: No acute fracture. No primary bone lesion or focal pathologic process. Soft tissues and spinal canal: No prevertebral fluid or swelling. No visible canal hematoma. Disc levels: There are disc degenerative changes most evident at C3-C4, C5-C6 and C6-C7, most severe C6-C7. Diffuse disc bulging and uncovertebral spurring causes neural foraminal narrowing, moderate bilaterally at C5-C6 and C6-C7. There are facet degenerative changes most apparent at C3-C4 and C4-C5. No convincing disc herniation. Upper chest: No acute or significant abnormality. Other: None IMPRESSION: HEAD CT: No acute intracranial abnormalities. No skull fracture. Right parietal scalp hematoma. CERVICAL CT:  No fracture or acute finding. Electronically Signed   By: Lajean Manes M.D.   On: 11/21/2016 17:42   Ct Cervical Spine Wo Contrast  Result Date: 11/21/2016 CLINICAL DATA:  Pt fell while in kitchen and hit head on corner of refrigerator. Pt states hit side and back of head. Pt has bruising right frontal. Pt denies headache. Pt has some dizziness. Pt takes blood thinner. Pt denies neck pain. EXAM: CT HEAD WITHOUT CONTRAST CT CERVICAL SPINE WITHOUT CONTRAST TECHNIQUE: Multidetector CT imaging of the head and cervical spine was performed following the standard protocol without intravenous contrast. Multiplanar CT image reconstructions of the cervical  spine were also generated. COMPARISON:  Brain MRI, 10/07/2016.  CT, 10/06/2016. FINDINGS: CT HEAD FINDINGS Brain: No evidence of acute infarction, hemorrhage, hydrocephalus, extra-axial collection or mass lesion/mass effect. The ventricles and sulci are enlarged reflecting mild to moderate generalized atrophy. Patchy white matter hypoattenuation is noted consistent with mild chronic microvascular ischemic change. Vascular: No hyperdense vessel or unexpected calcification. Skull: Normal. Negative for fracture or focal lesion. Sinuses/Orbits: No acute finding. Other: Right parietal scalp hematoma. CT CERVICAL SPINE FINDINGS Alignment: Normal. Skull base and vertebrae: No acute fracture. No primary bone lesion or focal pathologic process. Soft tissues and spinal canal: No prevertebral  fluid or swelling. No visible canal hematoma. Disc levels: There are disc degenerative changes most evident at C3-C4, C5-C6 and C6-C7, most severe C6-C7. Diffuse disc bulging and uncovertebral spurring causes neural foraminal narrowing, moderate bilaterally at C5-C6 and C6-C7. There are facet degenerative changes most apparent at C3-C4 and C4-C5. No convincing disc herniation. Upper chest: No acute or significant abnormality. Other: None IMPRESSION: HEAD CT: No acute intracranial abnormalities. No skull fracture. Right parietal scalp hematoma. CERVICAL CT:  No fracture or acute finding. Electronically Signed   By: Lajean Manes M.D.   On: 11/21/2016 17:42    EKG: Independently reviewed. Sinus bradycardia with rate around 57 bpm. Nonspecific ST-T changes in inferior leads.  Assessment/Plan Principal Problem:   Post concussive syndrome Active Problems:   Essential hypertension   Hypothyroidism   Controlled type 2 diabetes mellitus with complication, without long-term current use of insulin (HCC)   Post concussion syndrome    1. Postconcussive syndrome - closely monitor in telemetry. Get  physical therapy consult. When  necessary Tylenol for headache. Hold aspirin due to scalp hematoma. 2. Diabetes mellitus type 2 - hold metformin while inpatient. I have placed patient on sliding scale coverage. 3. Hypothyroidism on Synthroid. 4. Hypertension - on Plendil and Cozaar which may need to be held if patient remains low normal blood pressure. Continue hydration. 5. Hyperlipidemia on statins. 6. History of TIA presently holding off aspirin due to scalp hematoma. 7. Anemia - follow CBC.  X-ray pelvis is pending.   DVT prophylaxis: SCDs. Code Status: Full code.  Family Communication: Discussed with patient.  Disposition Plan: Home.  Consults called: Physical therapy.  Admission status: Observation.    Rise Patience MD [facility] Pager [phone].  If 7PM-7AM, please contact night-coverage [email or web address] Password [facility]  11/21/2016, 11:53 PM

## 2016-11-21 NOTE — Discharge Instructions (Addendum)
Post-Concussion Syndrome Introduction Post-concussion syndrome is the symptoms that can occur after a head injury. These symptoms can last from weeks to months. Follow these instructions at home:  Take medicines only as told by your doctor.  Do not take aspirin.  Sleep with your head raised to help with headaches.  Avoid activities that can cause another head injury.  Do not play contact sports like football, hockey, soccer, or basketball.  Do not do other risky activities like downhill skiing, martial arts, or horseback riding until your doctor says it is okay.  Keep all follow-up visits as told by your doctor. This is important. Contact a doctor if:  You have a harder time:  Paying attention.  Focusing.  Remembering.  Learning new information.  Dealing with stress.  You need more time to complete tasks.  You are easily bothered (irritable).  You have more symptoms. Get help if you have any of these symptoms for more than two weeks after your injury:  Long-lasting (chronic) headaches.  Dizziness.  Trouble balancing.  Feeling sick to your stomach (nauseous).  Trouble with your vision.  Noise or light bothers you more.  Depression.  Mood swings.  Feeling worried (anxious).  Easily bothered.  Memory problems.  Trouble concentrating or paying attention.  Sleep problems.  Feeling tired all of the time. Get help right away if:  You feel confused.  You feel very sleepy.  You are hard to wake up.  You feel sick to your stomach.  You keep throwing up (vomiting).  You feel like you are moving when you are not (vertigo).  Your eyes move back and forth very quickly.  You start shaking (convulsing) or pass out (faint).  You have very bad headaches that do not get better with medicine.  You cannot use your arms or legs like normal.  One of the black centers of your eyes (pupils) is bigger than the other.  You have clear or bloody fluid coming  from your nose or ears.  Your problems get worse, not better. This information is not intended to replace advice given to you by your health care provider. Make sure you discuss any questions you have with your health care provider. Document Released: 12/19/2004 Document Revised: 04/18/2016 Document Reviewed: 02/16/2014  2017 Elsevier Concussion, Adult A concussion is a brain injury. It is caused by:  A hit to the head.  A quick and sudden movement (jolt) of the head or neck. A concussion is usually not life threatening. Even so, it can cause serious problems. If you had a concussion before, you may have concussion-like problems after a hit to your head. Follow these instructions at home: General instructions  Follow your doctor's directions carefully.  Take medicines only as told by your doctor.  Only take medicines your doctor says are safe.  Do not drink alcohol until your doctor says it is okay. Alcohol and some drugs can slow down healing. They can also put you at risk for further injury.  If you are having trouble remembering things, write them down.  Try to do one thing at a time if you get distracted easily. For example, do not watch TV while making dinner.  Talk to your family members or close friends when making important decisions.  Follow up with your doctor as told.  Watch your symptoms. Tell others to do the same. Serious problems can sometimes happen after a concussion. Older adults are more likely to have these problems.  Tell your teachers,  school nurse, school counselor, coach, Product/process development scientist, or work Freight forwarder about your concussion. Tell them about what you can or cannot do. They should watch to see if:  It gets even harder for you to pay attention or concentrate.  It gets even harder for you to remember things or learn new things.  You need more time than normal to finish things.  You become annoyed (irritable) more than before.  You are not able to  deal with stress as well.  You have more problems than before.  Rest. Make sure you:  Get plenty of sleep at night.  Go to sleep early.  Go to bed at the same time every day. Try to wake up at the same time.  Rest during the day.  Take naps when you feel tired.  Limit activities where you have to think a lot or concentrate. These include:  Doing homework.  Doing work related to a job.  Watching TV.  Using the computer. Returning To Your Regular Activities  Return to your normal activities slowly, not all at once. You must give your body and brain enough time to heal.  Do not play sports or do other athletic activities until your doctor says it is okay.  Ask your doctor when you can drive, ride a bicycle, or work other vehicles or machines. Never do these things if you feel dizzy.  Ask your doctor about when you can return to work or school. Preventing Another Concussion  It is very important to avoid another brain injury, especially before you have healed. In rare cases, another injury can lead to permanent brain damage, brain swelling, or death. The risk of this is greatest during the first 7-10 days after your injury. Avoid injuries by:  Wearing a seat belt when riding in a car.  Not drinking too much alcohol.  Avoiding activities that could lead to a second concussion (such as contact sports).  Wearing a helmet when doing activities like:  Biking.  Skiing.  Skateboarding.  Skating.  Making your home safer by:  Removing things from the floor or stairways that could make you trip.  Using grab bars in bathrooms and handrails by stairs.  Placing non-slip mats on floors and in bathtubs.  Improve lighting in dark areas. Contact a doctor if:  It gets even harder for you to pay attention or concentrate.  It gets even harder for you to remember things or learn new things.  You need more time than normal to finish things.  You become annoyed (irritable)  more than before.  You are not able to deal with stress as well.  You have more problems than before.  You have problems keeping your balance.  You are not able to react quickly when you should. Get help if you have any of these problems for more than 2 weeks:  Lasting (chronic) headaches.  Dizziness or trouble balancing.  Feeling sick to your stomach (nausea).  Seeing (vision) problems.  Being affected by noises or light more than normal.  Feeling sad, low, down in the dumps, blue, gloomy, or empty (depressed).  Mood changes (mood swings).  Feeling of fear or nervousness about what may happen (anxiety).  Feeling annoyed.  Memory problems.  Problems concentrating or paying attention.  Sleep problems.  Feeling tired all the time. Get help right away if:  You have bad headaches or your headaches get worse.  You have weakness (even if it is in one hand, leg, or part of  the face).  You have loss of feeling (numbness).  You feel off balance.  You keep throwing up (vomiting).  You feel tired.  One black center of your eye (pupil) is larger than the other.  You twitch or shake violently (convulse).  Your speech is not clear (slurred).  You are more confused, easily angered (agitated), or annoyed than before.  You have more trouble resting than before.  You are unable to recognize people or places.  You have neck pain.  It is difficult to wake you up.  You have unusual behavior changes.  You pass out (lose consciousness). This information is not intended to replace advice given to you by your health care provider. Make sure you discuss any questions you have with your health care provider. Document Released: 10/30/2009 Document Revised: 04/18/2016 Document Reviewed: 06/03/2013 Elsevier Interactive Patient Education  2017 [employer]. Return immediately if any vomiting, confusion, difficulty walking, or vision problems.

## 2016-11-21 NOTE — ED Notes (Signed)
Pt ambulated to bathroom and once there became unsteady on her feet and began to vomit.  Wheelchair back to bed.  EDP notified.  Will continue to monitor.

## 2016-11-21 NOTE — ED Notes (Signed)
Pt able to ambulate without assistance.  

## 2016-11-21 NOTE — ED Provider Notes (Signed)
[facility] DEPT Provider Note   CSN: [id number] Arrival date & time: 11/21/16 1539     History    Chief Complaint  Patient presents with  . Fall  . Head Injury     HPI [name] is a 82 y.o. female.  82yo F w/ PMH including T2DM, HTN, CVA who p/w fall and head injury. Just prior to arrival, the patient was at home and put something into her fridge then lost her balance and fell. On the way down she struck the back of her head. She had a brief loss of consciousness and immediately upon waking had one episode of vomiting. Patient was awake upon EMS arrival and she complains of pain in the back of her head but no other sources of pain. She denies any extremity injury or weakness. She denies any further nausea. She states that she lost her balance and fell, did not syncopize and fall. She is UTD on tetanus.    Past Medical History:  Diagnosis Date  . Arthritis   . Cancer [facility]    breast cancer on left - 10  years ago  . Diabetes mellitus without complication ([place])   . GERD (gastroesophageal reflux disease)   . Hypertension   . Hypothyroidism   . Stroke ([place])    2014  . Wears glasses      Patient Active Problem List   Diagnosis Date Noted  . Generalized anxiety disorder 10/23/2016  . Severe episode of recurrent major depressive disorder, without psychotic features ([place])   . Reactive depression   . Debilitated 10/09/2016  . Transient cerebral ischemia   . Anxiety state   . Controlled type 2 diabetes mellitus with complication, without long-term current use of insulin ([place])   . History of breast cancer   . Acute blood loss anemia   . Stage 2 chronic kidney disease   . Syncope 10/07/2016  . Stroke ([place]) 10/06/2016  . Stroke (cerebrum) ([place]) 10/06/2016  . Aphasia 10/06/2016  . Leukocytosis 10/06/2016  . Essential hypertension 10/06/2016  . Hypothyroidism 10/06/2016  . GERD (gastroesophageal reflux disease) 10/06/2016  . Hyperlipemia 10/06/2016    Past  Surgical History:  Procedure Laterality Date  . ABDOMINAL HYSTERECTOMY    . APPENDECTOMY    . ARCUATE KERATECTOMY    . BUNIONECTOMY     right  . CARPAL TUNNEL RELEASE     right  . CARPAL TUNNEL RELEASE Left 05/24/2014   Procedure: LEFT CARPAL TUNNEL RELEASE LEFT THUMB TRIGGER FINGER RELEASE;  Surgeon: Cammie Sickle, MD;  Location: [facility];  Service: Orthopedics;  Laterality: Left;  left thumb also site of incision  . DEBRIDEMENT OF ABDOMINAL WALL ABSCESS  1978  . OVARIAN CYST SURGERY     x2-  . TONSILLECTOMY    . TRIGGER FINGER RELEASE Left 01/25/2016   Procedure: RELEASE A-1 PULLEY LEFT RING FINGER, LEFT SMALL FINGER;  Surgeon: Daryll Brod, MD;  Location: [facility];  Service: Orthopedics;  Laterality: Left;  ANESTHESIA: IV REGIONAL FAB    OB History    No data available        Home Medications    Prior to Admission medications   Medication Sig Start Date End Date Taking? Authorizing Provider  acetaminophen (TYLENOL) 325 MG tablet Take 1 tablet (325 mg total) by mouth every 6 (six) hours as needed for mild pain, fever or headache. 10/16/16  Yes Ivan Anchors Love, PA-C  ALPRAZolam (XANAX) 0.25 MG tablet Take 0.25  mg by mouth 3 (three) times daily.   Yes Historical Provider, MD  aspirin 325 MG tablet Take 1 tablet (325 mg total) by mouth daily. 10/09/16  Yes Geradine Girt, DO  atorvastatin (LIPITOR) 40 MG tablet Take 1 tablet (40 mg total) by mouth daily at 6 PM. 10/09/16  Yes Geradine Girt, DO  betamethasone valerate lotion (VALISONE) 0.1 % Apply 1 application topically daily as needed for irritation. 10/24/16  Yes Historical Provider, MD  famotidine (PEPCID) 20 MG tablet Take 20 mg by mouth daily.   Yes Historical Provider, MD  felodipine (PLENDIL) 5 MG 24 hr tablet Take 5 mg by mouth daily.   Yes Historical Provider, MD  FLUoxetine (PROZAC) 10 MG capsule Take 1 capsule (10 mg total) by mouth daily. 10/17/16  Yes Ivan Anchors Love, PA-C    levothyroxine (SYNTHROID, LEVOTHROID) 50 MCG tablet Take 50 mcg by mouth daily before breakfast.   Yes Historical Provider, MD  losartan (COZAAR) 100 MG tablet Take 1 tablet (100 mg total) by mouth daily. 10/16/16  Yes Ivan Anchors Love, PA-C  metFORMIN (GLUCOPHAGE-XR) 750 MG 24 hr tablet Take 750 mg by mouth at bedtime. 11/19/16  Yes Historical Provider, MD      History reviewed. No pertinent family history.   Social History  Substance Use Topics  . Smoking status: Former Smoker    Types: Cigarettes  . Smokeless tobacco: Never Used     Comment: Quit smoking 25 years agl  . Alcohol use 0.0 oz/week     Comment: Occassionally glass of wine     Allergies     Tramadol; Morphine and related; Penicillins; Tetracyclines & related; Hydrocodone-acetaminophen; and Sitagliptin    Review of Systems  10 Systems reviewed and are negative for acute change except as noted in the HPI.   Physical Exam Updated Vital Signs BP 90/59   Pulse 60   Temp 98 F (36.7 C)   Resp 16   SpO2 99%   Physical Exam  Constitutional: She is oriented to person, place, and time. She appears well-developed and well-nourished. No distress.  Awake, alert  HENT:  Head: Normocephalic and atraumatic.  Mouth/Throat: Oropharynx is clear and moist.  Posterior scalp matted with dried blood; large hematoma left posterior scalp with no active bleeding,  No lacerations  Eyes: Conjunctivae and EOM are normal. Pupils are equal, round, and reactive to light.  Neck:  In soft c-collar  Cardiovascular: Normal rate, regular rhythm and normal heart sounds.   No murmur heard. Pulmonary/Chest: Effort normal and breath sounds normal. No respiratory distress.  Abdominal: Soft. Bowel sounds are normal. She exhibits no distension. There is no tenderness.  Musculoskeletal: She exhibits no edema.  Neurological: She is alert and oriented to person, place, and time. She has normal reflexes. No cranial nerve deficit. She exhibits  normal muscle tone.  Fluent speech,  no clonus 5/5 strength and normal sensation x all 4 extremities  Skin: Skin is warm and dry.  Psychiatric: She has a normal mood and affect. Judgment normal.  Nursing note and vitals reviewed.     ED Treatments / Results  Labs (all labs ordered are listed, but only abnormal results are displayed) Labs Reviewed  CBC WITH DIFFERENTIAL/PLATELET - Abnormal; Notable for the following:       Result Value   RBC 3.44 (*)    Hemoglobin 11.3 (*)    HCT 32.6 (*)    All other components within normal limits  BASIC METABOLIC PANEL  HEPATIC  FUNCTION PANEL  URINALYSIS, ROUTINE W REFLEX MICROSCOPIC     EKG  EKG Interpretation  Date/Time:  Thursday November 21 2016 16:01:04 EST Ventricular Rate:  57 PR Interval:    QRS Duration: 98 QT Interval:  401 QTC Calculation: 391 R Axis:   89 Text Interpretation:  Sinus rhythm Short PR interval Borderline right axis deviation Minimal ST elevation, inferior leads No significant change since last tracing Confirmed by LITTLE MD, RACHEL [phone]) on 11/21/2016 4:16:12 PM         Radiology Ct Head Wo Contrast  Result Date: 11/21/2016 CLINICAL DATA:  Pt fell while in kitchen and hit head on corner of refrigerator. Pt states hit side and back of head. Pt has bruising right frontal. Pt denies headache. Pt has some dizziness. Pt takes blood thinner. Pt denies neck pain. EXAM: CT HEAD WITHOUT CONTRAST CT CERVICAL SPINE WITHOUT CONTRAST TECHNIQUE: Multidetector CT imaging of the head and cervical spine was performed following the standard protocol without intravenous contrast. Multiplanar CT image reconstructions of the cervical spine were also generated. COMPARISON:  Brain MRI, 10/07/2016.  CT, 10/06/2016. FINDINGS: CT HEAD FINDINGS Brain: No evidence of acute infarction, hemorrhage, hydrocephalus, extra-axial collection or mass lesion/mass effect. The ventricles and sulci are enlarged reflecting mild to moderate  generalized atrophy. Patchy white matter hypoattenuation is noted consistent with mild chronic microvascular ischemic change. Vascular: No hyperdense vessel or unexpected calcification. Skull: Normal. Negative for fracture or focal lesion. Sinuses/Orbits: No acute finding. Other: Right parietal scalp hematoma. CT CERVICAL SPINE FINDINGS Alignment: Normal. Skull base and vertebrae: No acute fracture. No primary bone lesion or focal pathologic process. Soft tissues and spinal canal: No prevertebral fluid or swelling. No visible canal hematoma. Disc levels: There are disc degenerative changes most evident at C3-C4, C5-C6 and C6-C7, most severe C6-C7. Diffuse disc bulging and uncovertebral spurring causes neural foraminal narrowing, moderate bilaterally at C5-C6 and C6-C7. There are facet degenerative changes most apparent at C3-C4 and C4-C5. No convincing disc herniation. Upper chest: No acute or significant abnormality. Other: None IMPRESSION: HEAD CT: No acute intracranial abnormalities. No skull fracture. Right parietal scalp hematoma. CERVICAL CT:  No fracture or acute finding. Electronically Signed   By: Lajean Manes M.D.   On: 11/21/2016 17:42   Ct Cervical Spine Wo Contrast  Result Date: 11/21/2016 CLINICAL DATA:  Pt fell while in kitchen and hit head on corner of refrigerator. Pt states hit side and back of head. Pt has bruising right frontal. Pt denies headache. Pt has some dizziness. Pt takes blood thinner. Pt denies neck pain. EXAM: CT HEAD WITHOUT CONTRAST CT CERVICAL SPINE WITHOUT CONTRAST TECHNIQUE: Multidetector CT imaging of the head and cervical spine was performed following the standard protocol without intravenous contrast. Multiplanar CT image reconstructions of the cervical spine were also generated. COMPARISON:  Brain MRI, 10/07/2016.  CT, 10/06/2016. FINDINGS: CT HEAD FINDINGS Brain: No evidence of acute infarction, hemorrhage, hydrocephalus, extra-axial collection or mass lesion/mass  effect. The ventricles and sulci are enlarged reflecting mild to moderate generalized atrophy. Patchy white matter hypoattenuation is noted consistent with mild chronic microvascular ischemic change. Vascular: No hyperdense vessel or unexpected calcification. Skull: Normal. Negative for fracture or focal lesion. Sinuses/Orbits: No acute finding. Other: Right parietal scalp hematoma. CT CERVICAL SPINE FINDINGS Alignment: Normal. Skull base and vertebrae: No acute fracture. No primary bone lesion or focal pathologic process. Soft tissues and spinal canal: No prevertebral fluid or swelling. No visible canal hematoma. Disc levels: There are disc degenerative changes most evident  at C3-C4, C5-C6 and C6-C7, most severe C6-C7. Diffuse disc bulging and uncovertebral spurring causes neural foraminal narrowing, moderate bilaterally at C5-C6 and C6-C7. There are facet degenerative changes most apparent at C3-C4 and C4-C5. No convincing disc herniation. Upper chest: No acute or significant abnormality. Other: None IMPRESSION: HEAD CT: No acute intracranial abnormalities. No skull fracture. Right parietal scalp hematoma. CERVICAL CT:  No fracture or acute finding. Electronically Signed   By: Lajean Manes M.D.   On: 11/21/2016 17:42    Procedures Procedures (including critical care time) Procedures  Medications Ordered in ED  Medications  ondansetron (ZOFRAN) injection 4 mg (4 mg Intravenous Given 11/21/16 1924)  sodium chloride 0.9 % bolus 1,000 mL (1,000 mLs Intravenous New Bag/Given 11/21/16 2116)     Initial Impression / Assessment and Plan / ED Course  I have reviewed the triage vital signs and the nursing notes.  Pertinent imaging results that were available during my care of the patient were reviewed by me and considered in my medical decision making (see chart for details).  Clinical Course     Pt w/ fall from standing at home with head injury and brief LOC. She was awake and alert, GCS 15 on  arrival w/ normal VS. Normal neurologic exam. Large hematoma posterior scalp but no lacerations. Wounds were cleaned and irrigated. Obtained CT H and C-spine which were negative for acute injury aside from hematoma. Pt ambulated with nursing staff and had great difficulty with balance. She immediately went to restroom and vomited. Gave Zofran. Her nausea did improve but on several other attempts to ambulate she continued to fall to the side and had difficulty with balance, stating that her head felt very dizzy. I suspect postconcussive syndrome given her symptoms. She was alone and is not currently able to ambulate due to the severity of her symptoms therefore I feel she needs admission for observation and supportive treatment. Discussed with [facility], Dr. Hal Hope, and pt admitted for further care.  Final Clinical Impressions(s) / ED Diagnoses   Final diagnoses:  Hematoma of scalp, initial encounter  Closed head injury, initial encounter  Post concussive syndrome  Non-intractable vomiting with nausea, unspecified vomiting type     New Prescriptions   No medications on file       Sharlett Iles, MD 11/21/16 2313

## 2016-11-21 NOTE — ED Notes (Signed)
Pt ambulated with 2 person assist.  Slightly unsteady gait and dizziness present.  Pt does not feel comfortable or sure of her gait at this time. Upon returning to bed blood pressure had decreased to 90 systolic.  Complaints of new onset headache at this time.

## 2016-11-21 NOTE — ED Triage Notes (Signed)
Per EMS: Pt presents to ED for assessment after having a fall at home.  Pt initially had some memory impairment after the fall, but has since recalled she slipped and fell, hitting her head and then going unconscious.  Blood was found all over the floor on scene by EMS, and pt has caked blood on the back of her head.  EMS unable to find laceration on head, but hematoma noted.  Pt placed in towels for c-spine control.  Patient denies any complaints except pain with palpation to head.  1 episode of emesis after awaking.  CBG - 175.  Pt axo.

## 2016-11-21 NOTE — ED Notes (Signed)
Pt given sprite for po challenge. 

## 2016-11-22 ENCOUNTER — Observation Stay (HOSPITAL_COMMUNITY): Payer: Medicare Other

## 2016-11-22 ENCOUNTER — Ambulatory Visit: Payer: Medicare Other

## 2016-11-22 DIAGNOSIS — Z853 Personal history of malignant neoplasm of breast: Secondary | ICD-10-CM | POA: Diagnosis not present

## 2016-11-22 DIAGNOSIS — S060XAA Concussion with loss of consciousness status unknown, initial encounter: Secondary | ICD-10-CM | POA: Diagnosis present

## 2016-11-22 DIAGNOSIS — R42 Dizziness and giddiness: Secondary | ICD-10-CM | POA: Diagnosis present

## 2016-11-22 DIAGNOSIS — I10 Essential (primary) hypertension: Secondary | ICD-10-CM | POA: Diagnosis not present

## 2016-11-22 DIAGNOSIS — Z888 Allergy status to other drugs, medicaments and biological substances status: Secondary | ICD-10-CM | POA: Diagnosis not present

## 2016-11-22 DIAGNOSIS — Z880 Allergy status to penicillin: Secondary | ICD-10-CM | POA: Diagnosis not present

## 2016-11-22 DIAGNOSIS — I129 Hypertensive chronic kidney disease with stage 1 through stage 4 chronic kidney disease, or unspecified chronic kidney disease: Secondary | ICD-10-CM | POA: Diagnosis present

## 2016-11-22 DIAGNOSIS — Z87891 Personal history of nicotine dependence: Secondary | ICD-10-CM | POA: Diagnosis not present

## 2016-11-22 DIAGNOSIS — D649 Anemia, unspecified: Secondary | ICD-10-CM | POA: Diagnosis present

## 2016-11-22 DIAGNOSIS — N182 Chronic kidney disease, stage 2 (mild): Secondary | ICD-10-CM | POA: Diagnosis present

## 2016-11-22 DIAGNOSIS — E785 Hyperlipidemia, unspecified: Secondary | ICD-10-CM | POA: Diagnosis present

## 2016-11-22 DIAGNOSIS — Z90710 Acquired absence of both cervix and uterus: Secondary | ICD-10-CM | POA: Diagnosis not present

## 2016-11-22 DIAGNOSIS — E118 Type 2 diabetes mellitus with unspecified complications: Secondary | ICD-10-CM | POA: Diagnosis not present

## 2016-11-22 DIAGNOSIS — S3993XA Unspecified injury of pelvis, initial encounter: Secondary | ICD-10-CM | POA: Diagnosis not present

## 2016-11-22 DIAGNOSIS — Z885 Allergy status to narcotic agent status: Secondary | ICD-10-CM | POA: Diagnosis not present

## 2016-11-22 DIAGNOSIS — Z7984 Long term (current) use of oral hypoglycemic drugs: Secondary | ICD-10-CM | POA: Diagnosis not present

## 2016-11-22 DIAGNOSIS — E1122 Type 2 diabetes mellitus with diabetic chronic kidney disease: Secondary | ICD-10-CM | POA: Diagnosis present

## 2016-11-22 DIAGNOSIS — Z886 Allergy status to analgesic agent status: Secondary | ICD-10-CM | POA: Diagnosis not present

## 2016-11-22 DIAGNOSIS — Z7982 Long term (current) use of aspirin: Secondary | ICD-10-CM | POA: Diagnosis not present

## 2016-11-22 DIAGNOSIS — S0003XA Contusion of scalp, initial encounter: Secondary | ICD-10-CM | POA: Diagnosis not present

## 2016-11-22 DIAGNOSIS — Z79899 Other long term (current) drug therapy: Secondary | ICD-10-CM | POA: Diagnosis not present

## 2016-11-22 DIAGNOSIS — W0110XA Fall on same level from slipping, tripping and stumbling with subsequent striking against unspecified object, initial encounter: Secondary | ICD-10-CM | POA: Diagnosis present

## 2016-11-22 DIAGNOSIS — E039 Hypothyroidism, unspecified: Secondary | ICD-10-CM | POA: Diagnosis not present

## 2016-11-22 DIAGNOSIS — R001 Bradycardia, unspecified: Secondary | ICD-10-CM | POA: Diagnosis present

## 2016-11-22 DIAGNOSIS — F0781 Postconcussional syndrome: Secondary | ICD-10-CM | POA: Diagnosis not present

## 2016-11-22 DIAGNOSIS — K219 Gastro-esophageal reflux disease without esophagitis: Secondary | ICD-10-CM | POA: Diagnosis present

## 2016-11-22 DIAGNOSIS — S060X9A Concussion with loss of consciousness of unspecified duration, initial encounter: Secondary | ICD-10-CM | POA: Diagnosis present

## 2016-11-22 DIAGNOSIS — Z8249 Family history of ischemic heart disease and other diseases of the circulatory system: Secondary | ICD-10-CM | POA: Diagnosis not present

## 2016-11-22 DIAGNOSIS — Z8673 Personal history of transient ischemic attack (TIA), and cerebral infarction without residual deficits: Secondary | ICD-10-CM | POA: Diagnosis not present

## 2016-11-22 DIAGNOSIS — R112 Nausea with vomiting, unspecified: Secondary | ICD-10-CM | POA: Diagnosis present

## 2016-11-22 LAB — URINALYSIS, ROUTINE W REFLEX MICROSCOPIC
BILIRUBIN URINE: NEGATIVE
Glucose, UA: NEGATIVE mg/dL
Hgb urine dipstick: NEGATIVE
Ketones, ur: 5 mg/dL — AB
Leukocytes, UA: NEGATIVE
NITRITE: NEGATIVE
PH: 5 (ref 5.0–8.0)
Protein, ur: NEGATIVE mg/dL
SPECIFIC GRAVITY, URINE: 1.013 (ref 1.005–1.030)

## 2016-11-22 LAB — BASIC METABOLIC PANEL
Anion gap: 7 (ref 5–15)
BUN: 12 mg/dL (ref 6–20)
CALCIUM: 8.3 mg/dL — AB (ref 8.9–10.3)
CO2: 24 mmol/L (ref 22–32)
CREATININE: 0.85 mg/dL (ref 0.44–1.00)
Chloride: 104 mmol/L (ref 101–111)
Glucose, Bld: 100 mg/dL — ABNORMAL HIGH (ref 65–99)
Potassium: 4 mmol/L (ref 3.5–5.1)
SODIUM: 135 mmol/L (ref 135–145)

## 2016-11-22 LAB — GLUCOSE, CAPILLARY
GLUCOSE-CAPILLARY: 101 mg/dL — AB (ref 65–99)
GLUCOSE-CAPILLARY: 109 mg/dL — AB (ref 65–99)
Glucose-Capillary: 96 mg/dL (ref 65–99)
Glucose-Capillary: 104 mg/dL — ABNORMAL HIGH (ref 65–99)

## 2016-11-22 LAB — CBC
HCT: 29.2 % — ABNORMAL LOW (ref 36.0–46.0)
Hemoglobin: 9.9 g/dL — ABNORMAL LOW (ref 12.0–15.0)
MCH: 32.2 pg (ref 26.0–34.0)
MCHC: 33.9 g/dL (ref 30.0–36.0)
MCV: 95.1 fL (ref 78.0–100.0)
PLATELETS: 171 10*3/uL (ref 150–400)
RBC: 3.07 MIL/uL — ABNORMAL LOW (ref 3.87–5.11)
RDW: 12.8 % (ref 11.5–15.5)
WBC: 4.4 10*3/uL (ref 4.0–10.5)

## 2016-11-22 LAB — TROPONIN I

## 2016-11-22 MED ORDER — MECLIZINE HCL 12.5 MG PO TABS
12.5000 mg | ORAL_TABLET | Freq: Three times a day (TID) | ORAL | Status: DC | PRN
  Administered 2016-11-22: 12.5 mg via ORAL
  Filled 2016-11-22: qty 1

## 2016-11-22 NOTE — Progress Notes (Addendum)
Rehab Admissions Coordinator Note:  Patient was screened by Cleatrice Burke for appropriateness for an Inpatient Acute Rehab Consult per PT recommendation.  At this time, we are recommending await further progresss with therapy over the next 24- 48 hrs before deterning rehab venue needs.. I have placed an order for OT eval.  Cleatrice Burke 11/22/2016, 4:00 PM  I can be reached at [phone].

## 2016-11-22 NOTE — Progress Notes (Signed)
Physical Therapy Cancellation    11/22/16 1209  PT Visit Information  Last PT Received On 11/22/16  Reason Eval/Treat Not Completed  Eval initiated and prior to [employer] testing pt's lunch arrived. She wanted to eat her lunch before the testing/maneuvers performed. Encouraged patient to participate prior to eating as testing will likely make her nauseated. She chose to have nausea medicine, eat, and have PT return later today.    11/22/2016 Barry Brunner, PT Pager: [phone]

## 2016-11-22 NOTE — Evaluation (Signed)
Physical Therapy/Vestibular Evaluation   Patient Details Name: [name] MRN: [id number] DOB: 20-Jun-1934 Today's Date: 11/22/2016   History of Present Illness  82 y.o.femalewith history of diabetes mellitus type 2, hypertension, hyperlipidemia and hypothyroidism with history of TIA diagnosed in October this year presents tothe ER because of fall. Patient states she missed her step while getting water from the refrigerator and hit her head on the floor. Did not lose consciousness but started bleeding from the scalp. CT head did not show any intracranial bleed. Patient had at least 2 episodes of vomiting since episode and also has difficult gait and is admitted for postconcussive syndrome. Xray of pelvis and neck negative    Clinical Impression  Pt admitted with above diagnosis. Patient with increasing nausea (up to 9/10) with mobility. No vertigo or nystagmus elicited with all BPPV testing (utilized hospital bed to maximize position of inner ear due to pt's limitations--cervical ROM and scalp laceration). Experienced ~2 seconds of vertigo (floor moving back and forth) with sit to stand. Attempted to ambulate with imbalance increasing (posterior and right lean, wide base of support). Pt with photophobia. Appears vertigo may be related to mild TBI/concussion. Pt currently with functional limitations due to the deficits listed below (see PT Problem List).  Pt will benefit from skilled PT to increase their independence and safety with mobility to allow discharge to the venue listed below.       Follow Up Recommendations CIR    Equipment Recommendations  Rolling walker with 5" wheels    Recommendations for Other Services Rehab consult;OT consult     Precautions / Restrictions Precautions Precautions: Fall Precaution Comments: admitted 09/2016 due to fall; d/c'd to CIR      Mobility  Bed Mobility Overal bed mobility: Needs Assistance Bed Mobility: Rolling;Sidelying to Sit;Sit to  Sidelying Rolling: Min assist Sidelying to sit: Min assist;HOB elevated     Sit to sidelying: Mod assist General bed mobility comments: assist to roll due to pain & nausea; return to bed more difficult due to imbalance and posterior lean  Transfers Overall transfer level: Needs assistance Equipment used: 1 person hand held assist Transfers: Sit to/from Stand Sit to Stand: Mod assist         General transfer comment: as approaches upright, posterior lean with assist to prevent fall  Ambulation/Gait Ambulation/Gait assistance: Mod assist Ambulation Distance (Feet): 2 Feet Assistive device: 1 person hand held assist Gait Pattern/deviations: Decreased stride length;Wide base of support;Shuffle     General Gait Details: staggering steps to right and reaching for RUE support (PT holding left hand and supporting through torso to prevent fall). Stepped backwards two feet to return to bed.  Stairs            Wheelchair Mobility    Modified Rankin (Stroke Patients Only)       Balance Overall balance assessment: Needs assistance Sitting-balance support: Bilateral upper extremity supported;Feet supported Sitting balance-Leahy Scale: Poor Sitting balance - Comments: on return to sit from stand, posterior lean  Postural control: Posterior lean Standing balance support: Single extremity supported Standing balance-Leahy Scale: Zero Standing balance comment: posterior and right lean                             Pertinent Vitals/Pain Pain Assessment: Faces Faces Pain Scale: Hurts whole lot Pain Location: back of head, neck Pain Descriptors / Indicators: Grimacing;Guarding;Sharp Pain Intervention(s): Limited activity within patient's tolerance;Monitored during session;Repositioned  Home Living Family/patient expects to be discharged to:: Private residence Living Arrangements: Alone Available Help at Discharge: Family;Friend(s);Available  PRN/intermittently Type of Home: House Home Access: Level entry     Home Layout: One level Home Equipment: Shower seat - built in      Prior Function Level of Independence: Independent               Hand Dominance   Dominant Hand: Right    Extremity/Trunk Assessment   Upper Extremity Assessment Upper Extremity Assessment: Overall WFL for tasks assessed    Lower Extremity Assessment Lower Extremity Assessment: Overall WFL for tasks assessed       Communication   Communication: No difficulties  Cognition Arousal/Alertness: Awake/alert Behavior During Therapy: WFL for tasks assessed/performed Overall Cognitive Status: No family/caregiver present to determine baseline cognitive functioning       Memory: Decreased short-term memory         General Comments: I can't remember what I said before...see what I told the doctor (re: fall)    General Comments General comments (skin integrity, edema, etc.): Called Dr. Eliseo Squires to discuss negative BPPV test results and observation status. Patient not safe to return home alone    Exercises     Assessment/Plan    PT Assessment Patient needs continued PT services  PT Problem List Decreased activity tolerance;Decreased balance;Decreased mobility;Decreased cognition;Decreased knowledge of use of DME;Pain (vertigo)          PT Treatment Interventions DME instruction;Gait training;Stair training;Functional mobility training;Therapeutic activities;Therapeutic exercise;Balance training;Neuromuscular re-education;Cognitive remediation;Patient/family education    PT Goals (Current goals can be found in the Care Plan section)  Acute Rehab PT Goals Patient Stated Goal: be independent again PT Goal Formulation: With patient Time For Goal Achievement: 11/29/16 Potential to Achieve Goals: Good    Frequency Min 3X/week   Barriers to discharge Decreased caregiver support      Co-evaluation               End of Session  Equipment Utilized During Treatment: Gait belt Activity Tolerance: Treatment limited secondary to medical complications (Comment) (nausea; imbalance) Patient left: in bed;with call bell/phone within reach;with bed alarm set Nurse Communication: Mobility status    Functional Assessment Tool Used: clinical judgment Functional Limitation: Mobility: Walking and moving around Mobility: Walking and Moving Around Current Status [id number]): At least 60 percent but less than 80 percent impaired, limited or restricted Mobility: Walking and Moving Around Goal Status [phone]): At least 1 percent but less than 20 percent impaired, limited or restricted    Time: [phone] (time adjusted to include a.m. eval time) PT Time Calculation (min) (ACUTE ONLY): 68 min   Charges:   PT Evaluation $PT Eval High Complexity: 1 Procedure PT Treatments $Therapeutic Activity: 38-52 mins $Self Care/Home Management: 8-22   PT G Codes:   PT G-Codes **NOT FOR INPATIENT CLASS** Functional Assessment Tool Used: clinical judgment Functional Limitation: Mobility: Walking and moving around Mobility: Walking and Moving Around Current Status [id number]): At least 60 percent but less than 80 percent impaired, limited or restricted Mobility: Walking and Moving Around Goal Status [phone]): At least 1 percent but less than 20 percent impaired, limited or restricted    Rexanne Mano 11/22/2016, 3:43 PM Pager [phone]

## 2016-11-22 NOTE — Progress Notes (Signed)
Pt admitted from ED with c/o of post fall with head trauma, alert and oriented, c/o of slight pain on her tail bone, pt settled in bed with call light at bedside, tele monitor put and verified on pt, v/s stable, will however continue to monitor. [place]-Asidi, Lavoris Sparling Efe

## 2016-11-22 NOTE — Progress Notes (Signed)
PROGRESS NOTE    [name]  [id number] DOB: [birth year] DOA: 11/21/2016 PCP: Mathews Argyle, MD   Outpatient Specialists:    Brief Narrative:  [name] is a 82 y.o. female with history of diabetes mellitus type 2, hypertension, hyperlipidemia and hypothyroidism with history of TIA diagnosed in October this year presents to the ER because of fall. [name] states she missed her step while getting water from the refrigerator and hit her head on the floor. Did not lose consciousness but started bleeding from the scalp. EMS was called and [name] was brought to the ER. On exam [name] has large scalp hematoma on the posterior aspect. CT head did not show any intracranial bleed. [name] had at least 2 episodes of vomiting since episode and also has difficult gait and is admitted for postconcussive syndrome. [name] has mild headache also. On exam [name] appears nonfocal.  Of note, [name] was in hospital with fall/rhabdomylysis about 2 months ago--- went to CIR  Assessment & Plan:   Principal Problem:   Post concussive syndrome Active Problems:   Essential hypertension   Hypothyroidism   Controlled type 2 diabetes mellitus with complication, without long-term current use of insulin ([place])   Post concussion syndrome   Post concussive symptoms -rest, avoid TV, smart phones -neuro checks -[name] had fall, did not lose consciousness- no seizure like activity (was supposed to have event monitor after last d/c-- do not see records)  N/V/dizziness -ongoing issue since January of this year -PT-vestibular -meclizine PRN  HTN -continue home meds   DVT prophylaxis:  SCD's  Code Status: Full Code   Family Communication: [name]/called sister-- sent to VM  Disposition Plan:  PT/OT eval   Consultants:        Subjective: C/o dizziness when she moves  Objective: Vitals:   11/21/16 2245 11/21/16 2315 11/22/16 0148 11/22/16 0622  BP: 100/85 (!) 141/53  (!) 172/57 (!) 109/47  Pulse: 64 (!) 56 67 (!) 54  Resp:   16 16  Temp:   97.7 F (36.5 C) 98.5 F (36.9 C)  TempSrc:   Oral Oral  SpO2: 97% 96% 99% 96%  Weight:   55.1 kg (121 lb 8 oz)   Height:   5' (1.524 m)     Intake/Output Summary (Last 24 hours) at 11/22/16 0753 Last data filed at 11/22/16 0314  Gross per 24 hour  Intake           208.33 ml  Output                0 ml  Net           208.33 ml   Filed Weights   11/22/16 0148  Weight: 55.1 kg (121 lb 8 oz)    Examination:  General exam: Appears calm and comfortable  Respiratory system: Clear to auscultation. Respiratory effort normal. Cardiovascular system: S1 & S2 heard, RRR. No JVD, murmurs, rubs, gallops or clicks. No pedal edema. Gastrointestinal system: Abdomen is nondistended, soft and nontender. No organomegaly or masses felt. Normal bowel sounds heard. Central nervous system: Alert and oriented. No focal neurological deficits.    Data Reviewed: I have personally reviewed following labs and imaging studies  CBC:  Recent Labs Lab 11/21/16 2229 11/22/16 0600  WBC 6.0 4.4  NEUTROABS 4.4  --   HGB 11.3* 9.9*  HCT 32.6* 29.2*  MCV 94.8 95.1  PLT 191 [id number]   Basic Metabolic Panel:  Recent Labs Lab 11/21/16 2229 11/22/16 0600  NA  133* 135  K 4.1 4.0  CL 101 104  CO2 24 24  GLUCOSE 135* 100*  BUN 13 12  CREATININE 0.94 0.85  CALCIUM 8.9 8.3*   GFR: Estimated Creatinine Clearance: 41.1 mL/min (by C-G formula based on SCr of 0.85 mg/dL). Liver Function Tests:  Recent Labs Lab 11/21/16 2229  AST 28  ALT 22  ALKPHOS 59  BILITOT 0.7  PROT 6.7  ALBUMIN 3.6   No results for input(s): LIPASE, AMYLASE in the last 168 hours. No results for input(s): AMMONIA in the last 168 hours. Coagulation Profile: No results for input(s): INR, PROTIME in the last 168 hours. Cardiac Enzymes:  Recent Labs Lab 11/22/16 0600  TROPONINI <0.03   BNP (last 3 results) No results for input(s): PROBNP in the  last 8760 hours. HbA1C: No results for input(s): HGBA1C in the last 72 hours. CBG:  Recent Labs Lab 11/22/16 0702  GLUCAP 101*   Lipid Profile: No results for input(s): CHOL, HDL, LDLCALC, TRIG, CHOLHDL, LDLDIRECT in the last 72 hours. Thyroid Function Tests: No results for input(s): TSH, T4TOTAL, FREET4, T3FREE, THYROIDAB in the last 72 hours. Anemia Panel: No results for input(s): VITAMINB12, FOLATE, FERRITIN, TIBC, IRON, RETICCTPCT in the last 72 hours. Urine analysis:    Component Value Date/Time   COLORURINE YELLOW 11/22/2016 0029   APPEARANCEUR HAZY (A) 11/22/2016 0029   LABSPEC 1.013 11/22/2016 0029   PHURINE 5.0 11/22/2016 0029   GLUCOSEU NEGATIVE 11/22/2016 0029   HGBUR NEGATIVE 11/22/2016 0029   BILIRUBINUR NEGATIVE 11/22/2016 0029   KETONESUR 5 (A) 11/22/2016 0029   PROTEINUR NEGATIVE 11/22/2016 0029   NITRITE NEGATIVE 11/22/2016 0029   LEUKOCYTESUR NEGATIVE 11/22/2016 0029     )No results found for this or any previous visit (from the past 240 hour(s)).    Anti-infectives    None       Radiology Studies: Ct Head Wo Contrast  Result Date: 11/21/2016 CLINICAL DATA:  Pt fell while in kitchen and hit head on corner of refrigerator. Pt states hit side and back of head. Pt has bruising right frontal. Pt denies headache. Pt has some dizziness. Pt takes blood thinner. Pt denies neck pain. EXAM: CT HEAD WITHOUT CONTRAST CT CERVICAL SPINE WITHOUT CONTRAST TECHNIQUE: Multidetector CT imaging of the head and cervical spine was performed following the standard protocol without intravenous contrast. Multiplanar CT image reconstructions of the cervical spine were also generated. COMPARISON:  Brain MRI, 10/07/2016.  CT, 10/06/2016. FINDINGS: CT HEAD FINDINGS Brain: No evidence of acute infarction, hemorrhage, hydrocephalus, extra-axial collection or mass lesion/mass effect. The ventricles and sulci are enlarged reflecting mild to moderate generalized atrophy. Patchy white  matter hypoattenuation is noted consistent with mild chronic microvascular ischemic change. Vascular: No hyperdense vessel or unexpected calcification. Skull: Normal. Negative for fracture or focal lesion. Sinuses/Orbits: No acute finding. Other: Right parietal scalp hematoma. CT CERVICAL SPINE FINDINGS Alignment: Normal. Skull base and vertebrae: No acute fracture. No primary bone lesion or focal pathologic process. Soft tissues and spinal canal: No prevertebral fluid or swelling. No visible canal hematoma. Disc levels: There are disc degenerative changes most evident at C3-C4, C5-C6 and C6-C7, most severe C6-C7. Diffuse disc bulging and uncovertebral spurring causes neural foraminal narrowing, moderate bilaterally at C5-C6 and C6-C7. There are facet degenerative changes most apparent at C3-C4 and C4-C5. No convincing disc herniation. Upper chest: No acute or significant abnormality. Other: None IMPRESSION: HEAD CT: No acute intracranial abnormalities. No skull fracture. Right parietal scalp hematoma. CERVICAL CT:  No fracture  or acute finding. Electronically Signed   By: Lajean Manes M.D.   On: 11/21/2016 17:42   Ct Cervical Spine Wo Contrast  Result Date: 11/21/2016 CLINICAL DATA:  Pt fell while in kitchen and hit head on corner of refrigerator. Pt states hit side and back of head. Pt has bruising right frontal. Pt denies headache. Pt has some dizziness. Pt takes blood thinner. Pt denies neck pain. EXAM: CT HEAD WITHOUT CONTRAST CT CERVICAL SPINE WITHOUT CONTRAST TECHNIQUE: Multidetector CT imaging of the head and cervical spine was performed following the standard protocol without intravenous contrast. Multiplanar CT image reconstructions of the cervical spine were also generated. COMPARISON:  Brain MRI, 10/07/2016.  CT, 10/06/2016. FINDINGS: CT HEAD FINDINGS Brain: No evidence of acute infarction, hemorrhage, hydrocephalus, extra-axial collection or mass lesion/mass effect. The ventricles and sulci are  enlarged reflecting mild to moderate generalized atrophy. Patchy white matter hypoattenuation is noted consistent with mild chronic microvascular ischemic change. Vascular: No hyperdense vessel or unexpected calcification. Skull: Normal. Negative for fracture or focal lesion. Sinuses/Orbits: No acute finding. Other: Right parietal scalp hematoma. CT CERVICAL SPINE FINDINGS Alignment: Normal. Skull base and vertebrae: No acute fracture. No primary bone lesion or focal pathologic process. Soft tissues and spinal canal: No prevertebral fluid or swelling. No visible canal hematoma. Disc levels: There are disc degenerative changes most evident at C3-C4, C5-C6 and C6-C7, most severe C6-C7. Diffuse disc bulging and uncovertebral spurring causes neural foraminal narrowing, moderate bilaterally at C5-C6 and C6-C7. There are facet degenerative changes most apparent at C3-C4 and C4-C5. No convincing disc herniation. Upper chest: No acute or significant abnormality. Other: None IMPRESSION: HEAD CT: No acute intracranial abnormalities. No skull fracture. Right parietal scalp hematoma. CERVICAL CT:  No fracture or acute finding. Electronically Signed   By: Lajean Manes M.D.   On: 11/21/2016 17:42        Scheduled Meds: . ALPRAZolam  0.25 mg Oral TID  . atorvastatin  40 mg Oral q1800  . famotidine  20 mg Oral Daily  . felodipine  5 mg Oral Daily  . FLUoxetine  10 mg Oral Daily  . insulin aspart  0-9 Units Subcutaneous TID WC  . levothyroxine  50 mcg Oral QAC breakfast  . losartan  100 mg Oral Daily   Continuous Infusions: . sodium chloride 125 mL/hr at 11/22/16 0134     LOS: 0 days    Time spent: 28 min    [place], DO [facility] Pager [phone]  If 7PM-7AM, please contact night-coverage [email or web address] Password [facility] 11/22/2016, 7:53 AM

## 2016-11-23 DIAGNOSIS — I10 Essential (primary) hypertension: Secondary | ICD-10-CM

## 2016-11-23 DIAGNOSIS — E118 Type 2 diabetes mellitus with unspecified complications: Secondary | ICD-10-CM

## 2016-11-23 DIAGNOSIS — F0781 Postconcussional syndrome: Principal | ICD-10-CM

## 2016-11-23 DIAGNOSIS — E039 Hypothyroidism, unspecified: Secondary | ICD-10-CM

## 2016-11-23 DIAGNOSIS — R112 Nausea with vomiting, unspecified: Secondary | ICD-10-CM

## 2016-11-23 LAB — GLUCOSE, CAPILLARY
GLUCOSE-CAPILLARY: 111 mg/dL — AB (ref 65–99)
GLUCOSE-CAPILLARY: 102 mg/dL — AB (ref 65–99)
Glucose-Capillary: 85 mg/dL (ref 65–99)
Glucose-Capillary: 99 mg/dL (ref 65–99)

## 2016-11-23 LAB — CBC
HEMATOCRIT: 29.8 % — AB (ref 36.0–46.0)
HEMOGLOBIN: 10.1 g/dL — AB (ref 12.0–15.0)
MCH: 32.6 pg (ref 26.0–34.0)
MCHC: 33.9 g/dL (ref 30.0–36.0)
MCV: 96.1 fL (ref 78.0–100.0)
Platelets: 188 10*3/uL (ref 150–400)
RBC: 3.1 MIL/uL — ABNORMAL LOW (ref 3.87–5.11)
RDW: 12.9 % (ref 11.5–15.5)
WBC: 3.6 10*3/uL — ABNORMAL LOW (ref 4.0–10.5)

## 2016-11-23 LAB — BASIC METABOLIC PANEL
Anion gap: 8 (ref 5–15)
BUN: 9 mg/dL (ref 6–20)
CALCIUM: 8.5 mg/dL — AB (ref 8.9–10.3)
CHLORIDE: 107 mmol/L (ref 101–111)
CO2: 22 mmol/L (ref 22–32)
CREATININE: 0.81 mg/dL (ref 0.44–1.00)
GFR calc Af Amer: >60 mL/min (ref >60)
GFR calc non Af Amer: >60 mL/min (ref >60)
Glucose, Bld: 87 mg/dL (ref 65–99)
Potassium: 3.8 mmol/L (ref 3.5–5.1)
Sodium: 137 mmol/L (ref 135–145)

## 2016-11-23 NOTE — Progress Notes (Signed)
Physical Therapy Treatment Patient Details Name: [name] MRN: [id number] DOB: 1933-12-10 Today's Date: 11/23/2016    History of Present Illness 82 y.o.femalewith history of diabetes mellitus type 2, hypertension, hyperlipidemia and hypothyroidism with history of TIA diagnosed in October this year presents tothe ER because of fall. Patient states she missed her step while getting water from the refrigerator and hit her head on the floor. Did not lose consciousness but started bleeding from the scalp. CT head did not show any intracranial bleed. Patient had at least 2 episodes of vomiting since episode and also has difficult gait and is admitted for postconcussive syndrome. Xray of pelvis and neck negative    PT Comments    Pt denies dizziness and c/o nausea 3-4/10. Pt reports to be much better than yesterday. Pt able to tolerate ambulation this date but con't to require min/modA for stability due to staggering and nausea. Pt remains unsafe to return home alone. Pt to benefit from CIR to achieve safe mod I level of function for safe transition home.   Follow Up Recommendations  CIR     Equipment Recommendations  Rolling walker with 5" wheels    Recommendations for Other Services       Precautions / Restrictions Precautions Precautions: Fall Precaution Comments: admitted 09/2016 due to fall; d/c'd to CIR Restrictions Weight Bearing Restrictions: No    Mobility  Bed Mobility Overal bed mobility: Needs Assistance Bed Mobility: Supine to Sit     Supine to sit: Min guard     General bed mobility comments: increased time, pt in sidelying, pushed self up and scooted to EOB, no physical assist needed  Transfers Overall transfer level: Needs assistance Equipment used: 1 person hand held assist Transfers: Sit to/from Stand Sit to Stand: Min assist;Mod assist         General transfer comment: slow and gaurded/cautious, reached for PTs  hand  Ambulation/Gait Ambulation/Gait assistance: Min assist;Mod assist Ambulation Distance (Feet): 100 Feet Assistive device: 1 person hand held assist Gait Pattern/deviations: Step-through pattern;Decreased stride length Gait velocity: slow Gait velocity interpretation: Below normal speed for age/gender General Gait Details: pt occasionally reaching for hallway rail due to nausea and "i'm beginning to sweat" pt whoozy however denies dizziness just reports "nausea"   Stairs            Wheelchair Mobility    Modified Rankin (Stroke Patients Only)       Balance Overall balance assessment: Needs assistance Sitting-balance support: No upper extremity supported;Feet supported Sitting balance-Leahy Scale: Fair     Standing balance support: Single extremity supported Standing balance-Leahy Scale: Poor Standing balance comment: just unsteady due to nausea                    Cognition Arousal/Alertness: Awake/alert Behavior During Therapy: WFL for tasks assessed/performed Overall Cognitive Status: Within Functional Limits for tasks assessed                      Exercises      General Comments General comments (skin integrity, edema, etc.): pt given handout on concussions and explained nausea symptom and how time will heal      Pertinent Vitals/Pain Pain Assessment: No/denies pain (but reports 3-4/10 on nausea scale)    Home Living                      Prior Function            PT  Goals (current goals can now be found in the care plan section) Acute Rehab PT Goals Patient Stated Goal: stop the nausea Progress towards PT goals: Progressing toward goals    Frequency    Min 3X/week      PT Plan Current plan remains appropriate    Co-evaluation             End of Session Equipment Utilized During Treatment: Gait belt Activity Tolerance: Patient tolerated treatment well Patient left: in chair;with call bell/phone within  reach;with chair alarm set;with family/visitor present     Time: [id number] PT Time Calculation (min) (ACUTE ONLY): 40 min  Charges:  $Gait Training: 8-22 mins $Therapeutic Activity: 23-37 mins                    G CodesKoleen Distance Hasaan Radde 2016/12/05, 8:26 AM   [name], PT, DPT Pager #: [phone] Office #: [phone]

## 2016-11-23 NOTE — Consult Note (Signed)
Physical Medicine and Rehabilitation Consult Reason for Consult:decreased balance Referring Physician: Grandville Silos   HPI: [name] is a 82 y.o. female who is known to rehab from previous admission, fell at home in kitchen on 11/21/16. She mis-stepped and fell on the floor. She denies loss of consciousness. Pt was admitted to hospital the same day with CT of head negative for hemorrhage. She did sustain substantial external injury to the cranium. Cervical and pelvic images were negative for fracture. Pt has had difficulties with dizziness and nausea/vomiting since the accident. At this point, she pretty much is only experiencing nausea. The patient was up today with physical therapy and felt that she was much improved over her last few attempts to mobilize. She denies persistent headache or active cognitive deficits. PM&R was asked to assess her for inpatient rehab needs.    Review of Systems  Constitutional: Negative for chills and fever.  HENT: Negative for hearing loss.   Eyes: Negative for blurred vision, double vision and photophobia.  Respiratory: Negative for cough.   Cardiovascular: Negative for chest pain and palpitations.  Gastrointestinal: Positive for nausea and vomiting. Negative for heartburn.  Genitourinary: Negative for dysuria and urgency.  Musculoskeletal: Positive for falls. Negative for myalgias and neck pain.  Skin: Negative for rash.  Neurological: Positive for dizziness. Negative for headaches.  Endo/Heme/Allergies: Does not bruise/bleed easily.  Psychiatric/Behavioral: Negative for depression.   Past Medical History:  Diagnosis Date  . Arthritis   . Cancer [facility])    breast cancer on left - 10  years ago  . Diabetes mellitus without complication ([place])   . GERD (gastroesophageal reflux disease)   . Hypertension   . Hypothyroidism   . Stroke ([place])    2014  . Wears glasses    Past Surgical History:  Procedure Laterality Date  . ABDOMINAL  HYSTERECTOMY    . APPENDECTOMY    . ARCUATE KERATECTOMY    . BUNIONECTOMY     right  . CARPAL TUNNEL RELEASE     right  . CARPAL TUNNEL RELEASE Left 05/24/2014   Procedure: LEFT CARPAL TUNNEL RELEASE LEFT THUMB TRIGGER FINGER RELEASE;  Surgeon: Cammie Sickle, MD;  Location: [facility];  Service: Orthopedics;  Laterality: Left;  left thumb also site of incision  . DEBRIDEMENT OF ABDOMINAL WALL ABSCESS  1978  . OVARIAN CYST SURGERY     x2-  . TONSILLECTOMY    . TRIGGER FINGER RELEASE Left 01/25/2016   Procedure: RELEASE A-1 PULLEY LEFT RING FINGER, LEFT SMALL FINGER;  Surgeon: Daryll Brod, MD;  Location: [facility];  Service: Orthopedics;  Laterality: Left;  ANESTHESIA: IV REGIONAL FAB   Family History  Problem Relation Age of Onset  . Hypertension Other    Social History:  reports that she has quit smoking. Her smoking use included Cigarettes. She has never used smokeless tobacco. She reports that she drinks alcohol. She reports that she does not use drugs. Allergies:  Allergies  Allergen Reactions  . Tramadol Anaphylaxis  . Morphine And Related Nausea And Vomiting  . Penicillins Swelling  . Tetracyclines & Related Swelling    Face and eyes swells  . Hydrocodone-Acetaminophen Other (See Comments)  . Sitagliptin Other (See Comments)   Medications Prior to Admission  Medication Sig Dispense Refill  . acetaminophen (TYLENOL) 325 MG tablet Take 1 tablet (325 mg total) by mouth every 6 (six) hours as needed for mild pain, fever or headache.    Marland Kitchen  ALPRAZolam (XANAX) 0.25 MG tablet Take 0.25 mg by mouth 3 (three) times daily.    Marland Kitchen aspirin 325 MG tablet Take 1 tablet (325 mg total) by mouth daily.    Marland Kitchen atorvastatin (LIPITOR) 40 MG tablet Take 1 tablet (40 mg total) by mouth daily at 6 PM.    . betamethasone valerate lotion (VALISONE) 0.1 % Apply 1 application topically daily as needed for irritation.    . famotidine (PEPCID) 20 MG tablet Take 20 mg by mouth  daily.    . felodipine (PLENDIL) 5 MG 24 hr tablet Take 5 mg by mouth daily.    Marland Kitchen FLUoxetine (PROZAC) 10 MG capsule Take 1 capsule (10 mg total) by mouth daily. 30 capsule 0  . levothyroxine (SYNTHROID, LEVOTHROID) 50 MCG tablet Take 50 mcg by mouth daily before breakfast.    . losartan (COZAAR) 100 MG tablet Take 1 tablet (100 mg total) by mouth daily. 30 tablet 0  . metFORMIN (GLUCOPHAGE-XR) 750 MG 24 hr tablet Take 750 mg by mouth at bedtime.      Home: Home Living Family/patient expects to be discharged to:: Private residence Living Arrangements: Alone Available Help at Discharge: Family, Friend(s), Available PRN/intermittently Type of Home: House Home Access: Level entry [place]: One level Bathroom Shower/Tub: Gaffer, Charity fundraiser: Handicapped height Bathroom Accessibility: Yes Home Equipment: Civil engineer, contracting - built in, [employer] - toilet, Grab bars - tub/shower  Functional History: Prior Function Level of Independence: Independent Functional Status:  Mobility: Bed Mobility Overal bed mobility: Needs Assistance Bed Mobility: Supine to Sit, Sit to Supine Rolling: Min assist Sidelying to sit: Min guard Supine to sit: Min guard Sit to sidelying: Mod assist General bed mobility comments: increased time but pt able to manage bed mobility without physical assist Transfers Overall transfer level: Needs assistance Equipment used: 1 person hand held assist Transfers: Sit to/from Stand Sit to Stand: Min assist General transfer comment: Min assist for standing balance. Hand held assist once in standing Ambulation/Gait Ambulation/Gait assistance: Min assist, Mod assist Ambulation Distance (Feet): 100 Feet Assistive device: 1 person hand held assist Gait Pattern/deviations: Step-through pattern, Decreased stride length General Gait Details: pt occasionally reaching for hallway rail due to nausea and "i'm beginning to sweat" pt whoozy however denies dizziness just  reports "nausea" Gait velocity: slow Gait velocity interpretation: Below normal speed for age/gender    ADL: ADL Overall ADL's : Needs assistance/impaired Grooming: Minimal assistance, Wash/dry hands, Standing Grooming Details (indicate cue type and reason): Min assist for standing balance Upper Body Bathing: Set up, Supervision/ safety, Sitting Lower Body Bathing: Minimal assistance, Sit to/from stand Upper Body Dressing : Set up, Supervision/safety, Sitting Lower Body Dressing: Minimal assistance, Sit to/from stand Toilet Transfer: Moderate assistance, Ambulation, Regular Toilet Toileting- Clothing Manipulation and Hygiene: Minimal assistance, Sit to/from stand Functional mobility during ADLs: Moderate assistance (hand held assist)  Cognition: Cognition Overall Cognitive Status: Within Functional Limits for tasks assessed Orientation Level: Oriented X4 Cognition Arousal/Alertness: Awake/alert Behavior During Therapy: WFL for tasks assessed/performed Overall Cognitive Status: Within Functional Limits for tasks assessed Memory: Decreased short-term memory General Comments: I can't remember what I said before...see what I told the doctor (re: fall)  Blood pressure 121/77, pulse 60, temperature 98.4 F (36.9 C), temperature source Oral, resp. rate 18, height 5' (1.524 m), weight 55.1 kg (121 lb 8 oz), SpO2 94 %. Physical Exam  Constitutional: She is oriented to person, place, and time. She appears well-developed.  HENT:  Large hematoma, wound on right  parietal skull  Eyes: Pupils are equal, round, and reactive to light.  Neck: Normal range of motion.  Cardiovascular: Normal rate and regular rhythm.   Respiratory: Effort normal.  GI: She exhibits no distension.  Musculoskeletal: Normal range of motion.  Neurological: She is alert and oriented to person, place, and time. No cranial nerve deficit. Coordination normal.  No obvious nystagmus on exam. Moves all 4 limbs without  limitation. No sensory changes.   Psychiatric: She has a normal mood and affect. Her behavior is normal. Judgment and thought content normal.    Results for orders placed or performed during the hospital encounter of 11/21/16 (from the past 24 hour(s))  Glucose, capillary     Status: Abnormal   Collection Time: 11/22/16  5:13 PM  Result Value Ref Range   Glucose-Capillary 109 (H) 65 - 99 mg/dL   Comment 1 Notify RN    Comment 2 Document in Chart   Glucose, capillary     Status: Abnormal   Collection Time: 11/22/16  9:45 PM  Result Value Ref Range   Glucose-Capillary 104 (H) 65 - 99 mg/dL   Comment 1 Notify RN    Comment 2 Document in Chart   CBC     Status: Abnormal   Collection Time: 11/23/16  2:12 AM  Result Value Ref Range   WBC 3.6 (L) 4.0 - 10.5 K/uL   RBC 3.10 (L) 3.87 - 5.11 MIL/uL   Hemoglobin 10.1 (L) 12.0 - 15.0 g/dL   HCT 29.8 (L) 36.0 - 46.0 %   MCV 96.1 78.0 - 100.0 fL   MCH 32.6 26.0 - 34.0 pg   MCHC 33.9 30.0 - 36.0 g/dL   RDW 12.9 11.5 - 15.5 %   Platelets 188 150 - 400 K/uL  Basic metabolic panel     Status: Abnormal   Collection Time: 11/23/16  2:12 AM  Result Value Ref Range   Sodium 137 135 - 145 mmol/L   Potassium 3.8 3.5 - 5.1 mmol/L   Chloride 107 101 - 111 mmol/L   CO2 22 22 - 32 mmol/L   Glucose, Bld 87 65 - 99 mg/dL   BUN 9 6 - 20 mg/dL   Creatinine, Ser 0.81 0.44 - 1.00 mg/dL   Calcium 8.5 (L) 8.9 - 10.3 mg/dL   GFR calc non Af Amer >60 >60 mL/min   GFR calc Af Amer >60 >60 mL/min   Anion gap 8 5 - 15  Glucose, capillary     Status: None   Collection Time: 11/23/16  6:46 AM  Result Value Ref Range   Glucose-Capillary 85 65 - 99 mg/dL   Comment 1 Notify RN    Comment 2 Document in Chart   Glucose, capillary     Status: None   Collection Time: 11/23/16 11:29 AM  Result Value Ref Range   Glucose-Capillary 99 65 - 99 mg/dL   Ct Head Wo Contrast  Result Date: 11/21/2016 CLINICAL DATA:  Pt fell while in kitchen and hit head on corner of  refrigerator. Pt states hit side and back of head. Pt has bruising right frontal. Pt denies headache. Pt has some dizziness. Pt takes blood thinner. Pt denies neck pain. EXAM: CT HEAD WITHOUT CONTRAST CT CERVICAL SPINE WITHOUT CONTRAST TECHNIQUE: Multidetector CT imaging of the head and cervical spine was performed following the standard protocol without intravenous contrast. Multiplanar CT image reconstructions of the cervical spine were also generated. COMPARISON:  Brain MRI, 10/07/2016.  CT, 10/06/2016. FINDINGS: CT HEAD  FINDINGS Brain: No evidence of acute infarction, hemorrhage, hydrocephalus, extra-axial collection or mass lesion/mass effect. The ventricles and sulci are enlarged reflecting mild to moderate generalized atrophy. Patchy white matter hypoattenuation is noted consistent with mild chronic microvascular ischemic change. Vascular: No hyperdense vessel or unexpected calcification. Skull: Normal. Negative for fracture or focal lesion. Sinuses/Orbits: No acute finding. Other: Right parietal scalp hematoma. CT CERVICAL SPINE FINDINGS Alignment: Normal. Skull base and vertebrae: No acute fracture. No primary bone lesion or focal pathologic process. Soft tissues and spinal canal: No prevertebral fluid or swelling. No visible canal hematoma. Disc levels: There are disc degenerative changes most evident at C3-C4, C5-C6 and C6-C7, most severe C6-C7. Diffuse disc bulging and uncovertebral spurring causes neural foraminal narrowing, moderate bilaterally at C5-C6 and C6-C7. There are facet degenerative changes most apparent at C3-C4 and C4-C5. No convincing disc herniation. Upper chest: No acute or significant abnormality. Other: None IMPRESSION: HEAD CT: No acute intracranial abnormalities. No skull fracture. Right parietal scalp hematoma. CERVICAL CT:  No fracture or acute finding. Electronically Signed   By: Lajean Manes M.D.   On: 11/21/2016 17:42   Ct Cervical Spine Wo Contrast  Result Date:  11/21/2016 CLINICAL DATA:  Pt fell while in kitchen and hit head on corner of refrigerator. Pt states hit side and back of head. Pt has bruising right frontal. Pt denies headache. Pt has some dizziness. Pt takes blood thinner. Pt denies neck pain. EXAM: CT HEAD WITHOUT CONTRAST CT CERVICAL SPINE WITHOUT CONTRAST TECHNIQUE: Multidetector CT imaging of the head and cervical spine was performed following the standard protocol without intravenous contrast. Multiplanar CT image reconstructions of the cervical spine were also generated. COMPARISON:  Brain MRI, 10/07/2016.  CT, 10/06/2016. FINDINGS: CT HEAD FINDINGS Brain: No evidence of acute infarction, hemorrhage, hydrocephalus, extra-axial collection or mass lesion/mass effect. The ventricles and sulci are enlarged reflecting mild to moderate generalized atrophy. Patchy white matter hypoattenuation is noted consistent with mild chronic microvascular ischemic change. Vascular: No hyperdense vessel or unexpected calcification. Skull: Normal. Negative for fracture or focal lesion. Sinuses/Orbits: No acute finding. Other: Right parietal scalp hematoma. CT CERVICAL SPINE FINDINGS Alignment: Normal. Skull base and vertebrae: No acute fracture. No primary bone lesion or focal pathologic process. Soft tissues and spinal canal: No prevertebral fluid or swelling. No visible canal hematoma. Disc levels: There are disc degenerative changes most evident at C3-C4, C5-C6 and C6-C7, most severe C6-C7. Diffuse disc bulging and uncovertebral spurring causes neural foraminal narrowing, moderate bilaterally at C5-C6 and C6-C7. There are facet degenerative changes most apparent at C3-C4 and C4-C5. No convincing disc herniation. Upper chest: No acute or significant abnormality. Other: None IMPRESSION: HEAD CT: No acute intracranial abnormalities. No skull fracture. Right parietal scalp hematoma. CERVICAL CT:  No fracture or acute finding. Electronically Signed   By: Lajean Manes M.D.    On: 11/21/2016 17:42   Dg Pelvis Portable  Result Date: 11/22/2016 CLINICAL DATA:  Recent fall EXAM: PORTABLE PELVIS 1-2 VIEWS COMPARISON:  None. FINDINGS: There is no evidence of pelvic fracture or diastasis. No pelvic bone lesions are seen. IMPRESSION: Negative. Electronically Signed   By: Franchot Gallo M.D.   On: 11/22/2016 08:23    Assessment/Plan: Diagnosis: Fall with concussion, post-concussive syndrome 1. Does the need for close, 24 hr/day medical supervision in concert with the patient's rehab needs make it unreasonable for this patient to be served in a less intensive setting? No 2. Co-Morbidities requiring supervision/potential complications: dm, htn 3. Due to bladder management, bowel  management, skin/wound care, disease management, medication administration and pain management, does the patient require 24 hr/day rehab nursing? No 4. Does the patient require coordinated care of a physician, rehab nurse, PT (1- hrs/day, 5 days/week) and OT (1-2 hrs/day, 5 days/week) to address physical and functional deficits in the context of the above medical diagnosis(es)? Yes Addressing deficits in the following areas: balance, endurance, locomotion, strength, transferring, bowel/bladder control, bathing, dressing, feeding, grooming and toileting 5. Can the patient actively participate in an intensive therapy program of at least 3 hrs of therapy per day at least 5 days per week? No 6. The potential for patient to make measurable gains while on inpatient rehab is excellent 7. Anticipated functional outcomes upon discharge from inpatient rehab are n/a  with PT, n/a with OT, n/a with SLP. 8. Estimated rehab length of stay to reach the above functional goals is: n/a 9. Does the patient have adequate social supports and living environment to accommodate these discharge functional goals? Yes 10. Anticipated D/C setting: Home 11. Anticipated post D/C treatments: [place] therapy 12. Overall Rehab/Functional  Prognosis: excellent  RECOMMENDATIONS: This patient's condition is appropriate for continued rehabilitative care in the following setting: [facility] Therapy Patient has agreed to participate in recommended program. Yes Note that insurance prior authorization may be required for reimbursement for recommended care.  Comment: Pt is progressing. Likely can return home with home health PT once medically clear for discharge.    [name], MD 11/23/2016

## 2016-11-23 NOTE — Evaluation (Signed)
Occupational Therapy Evaluation Patient Details Name: [name] MRN: [id number] DOB: 02-14-1934 Today's Date: 11/23/2016    History of Present Illness 82 y.o.femalewith history of diabetes mellitus type 2, hypertension, hyperlipidemia and hypothyroidism with history of TIA diagnosed in October this year presents tothe ER because of fall. Patient states she missed her step while getting water from the refrigerator and hit her head on the floor. Did not lose consciousness but started bleeding from the scalp. CT head did not show any intracranial bleed. Patient had at least 2 episodes of vomiting since episode and also has difficult gait and is admitted for postconcussive syndrome. Xray of pelvis and neck negative   Clinical Impression   Pt reports she was independent with ADL PTA. Currently pt mod assist for functional mobility and min assist for ADL. Pt presenting with poor balance in sitting and standing, decreased safety awareness and insight into deficits impacting her independence and safety with ADL and functional mobility. Recommending CIR level therapies to maximize independence and safety with ADL and functional mobility prior to return home. Pt would benefit from continued skilled OT to address established goals.    Follow Up Recommendations  CIR;Supervision/Assistance - 24 hour    Equipment Recommendations  None recommended by OT    Recommendations for Other Services Rehab consult     Precautions / Restrictions Precautions Precautions: Fall Precaution Comments: admitted 09/2016 due to fall; d/c'd to CIR Restrictions Weight Bearing Restrictions: No      Mobility Bed Mobility Overal bed mobility: Needs Assistance Bed Mobility: Supine to Sit;Sit to Supine   Sidelying to sit: Min guard Supine to sit: Min guard     General bed mobility comments: increased time but pt able to manage bed mobility without physical assist  Transfers Overall transfer level: Needs  assistance Equipment used: 1 person hand held assist Transfers: Sit to/from Stand Sit to Stand: Min assist         General transfer comment: Min assist for standing balance. Hand held assist once in standing    Balance Overall balance assessment: Needs assistance Sitting-balance support: Feet supported;No upper extremity supported Sitting balance-Leahy Scale: Fair     Standing balance support: No upper extremity supported;During functional activity Standing balance-Leahy Scale: Poor Standing balance comment: just unsteady due to nausea                            ADL Overall ADL's : Needs assistance/impaired     Grooming: Minimal assistance;Wash/dry hands;Standing Grooming Details (indicate cue type and reason): Min assist for standing balance Upper Body Bathing: Set up;Supervision/ safety;Sitting   Lower Body Bathing: Minimal assistance;Sit to/from stand   Upper Body Dressing : Set up;Supervision/safety;Sitting   Lower Body Dressing: Minimal assistance;Sit to/from stand   Toilet Transfer: Moderate assistance;Ambulation;Regular Toilet   Toileting- Clothing Manipulation and Hygiene: Minimal assistance;Sit to/from stand       Functional mobility during ADLs: Moderate assistance (hand held assist)       Vision     Perception     Praxis      Pertinent Vitals/Pain Pain Assessment: Faces Faces Pain Scale: Hurts little more Pain Location: headache Pain Descriptors / Indicators: Headache Pain Intervention(s): Monitored during session     Hand Dominance Right   Extremity/Trunk Assessment Upper Extremity Assessment Upper Extremity Assessment: Overall WFL for tasks assessed   Lower Extremity Assessment Lower Extremity Assessment: Defer to PT evaluation   Cervical / Trunk Assessment Cervical /  Trunk Assessment: Kyphotic   Communication Communication Communication: No difficulties   Cognition Arousal/Alertness: Awake/alert Behavior During  Therapy: WFL for tasks assessed/performed Overall Cognitive Status: Within Functional Limits for tasks assessed                     General Comments       Exercises       Shoulder Instructions      Home Living Family/patient expects to be discharged to:: Private residence Living Arrangements: Alone Available Help at Discharge: Family;Friend(s);Available PRN/intermittently Type of Home: House Home Access: Level entry     Home Layout: One level     Bathroom Shower/Tub: Walk-in shower;Door   [employer] Toilet: Handicapped height Bathroom Accessibility: Yes How Accessible: Accessible via walker Home Equipment: Shower seat - built in;Grab bars - toilet;Grab bars - tub/shower          Prior Functioning/Environment Level of Independence: Independent                 OT Problem List: Decreased strength;Impaired balance (sitting and/or standing);Impaired vision/perception;Decreased cognition;Decreased safety awareness;Decreased knowledge of use of DME or AE;Decreased knowledge of precautions;Pain   OT Treatment/Interventions: Self-care/ADL training;Therapeutic exercise;Energy conservation;DME and/or AE instruction;Therapeutic activities;Cognitive remediation/compensation;Patient/family education;Balance training;Visual/perceptual remediation/compensation    OT Goals(Current goals can be found in the care plan section) Acute Rehab OT Goals Patient Stated Goal: be independent OT Goal Formulation: With patient Time For Goal Achievement: 12/07/16 Potential to Achieve Goals: Good ADL Goals Pt Will Perform Grooming: with supervision;standing Pt Will Perform Upper Body Bathing: with set-up;sitting Pt Will Perform Lower Body Bathing: with supervision;sit to/from stand Pt Will Transfer to Toilet: with supervision;ambulating;regular height toilet Pt Will Perform Toileting - Clothing Manipulation and hygiene: with supervision;sit to/from stand  OT Frequency: Min 2X/week    Barriers to D/C:            Co-evaluation              End of Session Equipment Utilized During Treatment: Gait belt Nurse Communication: Mobility status  Activity Tolerance: Patient tolerated treatment well Patient left: in bed;with call bell/phone within reach;with bed alarm set   Time: [id number] OT Time Calculation (min): 18 min Charges:  OT General Charges $OT Visit: 1 Procedure OT Evaluation $OT Eval Moderate Complexity: 1 Procedure G-Codes:     Binnie Kand M.S., OTR/L Pager: [phone]  11/23/2016, 11:21 AM

## 2016-11-23 NOTE — Progress Notes (Signed)
PROGRESS NOTE    [name]  [id number] DOB: 31-Aug-1934 DOA: 11/21/2016 PCP: Mathews Argyle, MD    Brief Narrative:  [name] a 83 y.o.femalewith history of diabetes mellitus type 2, hypertension, hyperlipidemia and hypothyroidism with history of TIA diagnosed in October this year presents tothe ER because of fall. Patient states she missed her step while getting water from the refrigerator and hit her head on the floor. Did not lose consciousness but started bleeding from the scalp. EMS was called and patient was brought to the ER. On exam patient has large scalp hematoma on the posterior aspect. CT head did not show any intracranial bleed. Patient had at least 2 episodes of vomiting since episode and also has difficult gait and is admitted for postconcussive syndrome. Patient has mild headache also. On exam patient appears nonfocal. Of note, patient was in hospital with fall/rhabdomylysis about 2 months ago--- went to CIR    Assessment & Plan:   Principal Problem:   Post concussive syndrome Active Problems:   Essential hypertension   Hypothyroidism   Controlled type 2 diabetes mellitus with complication, without long-term current use of insulin (HCC)   Post concussion syndrome   Concussion  #1 postconcussive syndrome Patient noted to have some gait abnormalities as well as nausea and emesis. Nausea and emesis resolved. Gait improving per nursing and nurse tech and physical therapy note. CT head negative for any acute abnormalities. CIR evaluation pending to see patient is an appropriate candidate.  #2 nausea vomiting dizziness May partly be secondary to problem #1. Improving. Continue meclizine as needed.  #3 hypertension Stable. Continue felodipine and Cozaar.  #4 hypothyroidism Continue Synthroid.  #5 hyperlipidemia Continue Lipitor.  #6 well-controlled type 2 diabetes Hemoglobin A1c was 6.2 on 10/07/2016. CBGs have ranged from 85 - 111.  Sliding scale insulin.    DVT prophylaxis: SCDs Code Status: Full Family Communication: Updated patient and brother-in-law at bedside. Disposition Plan: CIR versus home with home health versus SNF pending CIR evaluation.   Consultants:   CIR    Procedures:   CT Head and C spine 11/21/2016  Xray pelvis 11/22/2016  Antimicrobials:   None   Subjective: Patient states feeling better. No CP. No SOB. Per Nursing patient with improving gait.  Objective: Vitals:   11/22/16 2113 11/23/16 0108 11/23/16 0533 11/23/16 0913  BP: 125/63 (!) 152/54 (!) 152/55 (!) 172/61  Pulse: (!) 56 (!) 54 (!) 52 (!) 55  Resp: 16 16 16 16   Temp: 97.9 F (36.6 C) 98.2 F (36.8 C) 98.5 F (36.9 C) 98.6 F (37 C)  TempSrc: Oral Oral Oral Oral  SpO2: 95% 96% 94% 96%  Weight:      Height:       No intake or output data in the 24 hours ending 11/23/16 1305 Filed Weights   11/22/16 0148  Weight: 55.1 kg (121 lb 8 oz)    Examination:  General exam: Appears calm and comfortable  Respiratory system: Clear to auscultation. Respiratory effort normal. Cardiovascular system: S1 & S2 heard, RRR. No JVD, murmurs, rubs, gallops or clicks. No pedal edema. Gastrointestinal system: Abdomen is nondistended, soft and nontender. No organomegaly or masses felt. Normal bowel sounds heard. Central nervous system: Alert and oriented. No focal neurological deficits. Extremities: Symmetric 5 x 5 power. Skin: No rashes, lesions or ulcers Psychiatry: Judgement and insight appear normal. Mood & affect appropriate.     Data Reviewed: I have personally reviewed following labs and imaging studies  CBC:  Recent Labs Lab 11/21/16 2229 11/22/16 0600 11/23/16 0212  WBC 6.0 4.4 3.6*  NEUTROABS 4.4  --   --   HGB 11.3* 9.9* 10.1*  HCT 32.6* 29.2* 29.8*  MCV 94.8 95.1 96.1  PLT 191 171 0000000   Basic Metabolic Panel:  Recent Labs Lab 11/21/16 2229 11/22/16 0600 11/23/16 0212  NA 133* 135 137  K 4.1 4.0  3.8  CL 101 104 107  CO2 24 24 22   GLUCOSE 135* 100* 87  BUN 13 12 9   CREATININE 0.94 0.85 0.81  CALCIUM 8.9 8.3* 8.5*   GFR: Estimated Creatinine Clearance: 43.1 mL/min (by C-G formula based on SCr of 0.81 mg/dL). Liver Function Tests:  Recent Labs Lab 11/21/16 2229  AST 28  ALT 22  ALKPHOS 59  BILITOT 0.7  PROT 6.7  ALBUMIN 3.6   No results for input(s): LIPASE, AMYLASE in the last 168 hours. No results for input(s): AMMONIA in the last 168 hours. Coagulation Profile: No results for input(s): INR, PROTIME in the last 168 hours. Cardiac Enzymes:  Recent Labs Lab 11/22/16 0600  TROPONINI <0.03   BNP (last 3 results) No results for input(s): PROBNP in the last 8760 hours. HbA1C: No results for input(s): HGBA1C in the last 72 hours. CBG:  Recent Labs Lab 11/22/16 1146 11/22/16 1713 11/22/16 2145 11/23/16 0646 11/23/16 1129  GLUCAP 96 109* 104* 85 99   Lipid Profile: No results for input(s): CHOL, HDL, LDLCALC, TRIG, CHOLHDL, LDLDIRECT in the last 72 hours. Thyroid Function Tests: No results for input(s): TSH, T4TOTAL, FREET4, T3FREE, THYROIDAB in the last 72 hours. Anemia Panel: No results for input(s): VITAMINB12, FOLATE, FERRITIN, TIBC, IRON, RETICCTPCT in the last 72 hours. Sepsis Labs: No results for input(s): PROCALCITON, LATICACIDVEN in the last 168 hours.  No results found for this or any previous visit (from the past 240 hour(s)).       Radiology Studies: Ct Head Wo Contrast  Result Date: 11/21/2016 CLINICAL DATA:  Pt fell while in kitchen and hit head on corner of refrigerator. Pt states hit side and back of head. Pt has bruising right frontal. Pt denies headache. Pt has some dizziness. Pt takes blood thinner. Pt denies neck pain. EXAM: CT HEAD WITHOUT CONTRAST CT CERVICAL SPINE WITHOUT CONTRAST TECHNIQUE: Multidetector CT imaging of the head and cervical spine was performed following the standard protocol without intravenous contrast.  Multiplanar CT image reconstructions of the cervical spine were also generated. COMPARISON:  Brain MRI, 10/07/2016.  CT, 10/06/2016. FINDINGS: CT HEAD FINDINGS Brain: No evidence of acute infarction, hemorrhage, hydrocephalus, extra-axial collection or mass lesion/mass effect. The ventricles and sulci are enlarged reflecting mild to moderate generalized atrophy. Patchy white matter hypoattenuation is noted consistent with mild chronic microvascular ischemic change. Vascular: No hyperdense vessel or unexpected calcification. Skull: Normal. Negative for fracture or focal lesion. Sinuses/Orbits: No acute finding. Other: Right parietal scalp hematoma. CT CERVICAL SPINE FINDINGS Alignment: Normal. Skull base and vertebrae: No acute fracture. No primary bone lesion or focal pathologic process. Soft tissues and spinal canal: No prevertebral fluid or swelling. No visible canal hematoma. Disc levels: There are disc degenerative changes most evident at C3-C4, C5-C6 and C6-C7, most severe C6-C7. Diffuse disc bulging and uncovertebral spurring causes neural foraminal narrowing, moderate bilaterally at C5-C6 and C6-C7. There are facet degenerative changes most apparent at C3-C4 and C4-C5. No convincing disc herniation. Upper chest: No acute or significant abnormality. Other: None IMPRESSION: HEAD CT: No acute intracranial abnormalities. No skull fracture. Right parietal scalp  hematoma. CERVICAL CT:  No fracture or acute finding. Electronically Signed   By: Lajean Manes M.D.   On: 11/21/2016 17:42   Ct Cervical Spine Wo Contrast  Result Date: 11/21/2016 CLINICAL DATA:  Pt fell while in kitchen and hit head on corner of refrigerator. Pt states hit side and back of head. Pt has bruising right frontal. Pt denies headache. Pt has some dizziness. Pt takes blood thinner. Pt denies neck pain. EXAM: CT HEAD WITHOUT CONTRAST CT CERVICAL SPINE WITHOUT CONTRAST TECHNIQUE: Multidetector CT imaging of the head and cervical spine was  performed following the standard protocol without intravenous contrast. Multiplanar CT image reconstructions of the cervical spine were also generated. COMPARISON:  Brain MRI, 10/07/2016.  CT, 10/06/2016. FINDINGS: CT HEAD FINDINGS Brain: No evidence of acute infarction, hemorrhage, hydrocephalus, extra-axial collection or mass lesion/mass effect. The ventricles and sulci are enlarged reflecting mild to moderate generalized atrophy. Patchy white matter hypoattenuation is noted consistent with mild chronic microvascular ischemic change. Vascular: No hyperdense vessel or unexpected calcification. Skull: Normal. Negative for fracture or focal lesion. Sinuses/Orbits: No acute finding. Other: Right parietal scalp hematoma. CT CERVICAL SPINE FINDINGS Alignment: Normal. Skull base and vertebrae: No acute fracture. No primary bone lesion or focal pathologic process. Soft tissues and spinal canal: No prevertebral fluid or swelling. No visible canal hematoma. Disc levels: There are disc degenerative changes most evident at C3-C4, C5-C6 and C6-C7, most severe C6-C7. Diffuse disc bulging and uncovertebral spurring causes neural foraminal narrowing, moderate bilaterally at C5-C6 and C6-C7. There are facet degenerative changes most apparent at C3-C4 and C4-C5. No convincing disc herniation. Upper chest: No acute or significant abnormality. Other: None IMPRESSION: HEAD CT: No acute intracranial abnormalities. No skull fracture. Right parietal scalp hematoma. CERVICAL CT:  No fracture or acute finding. Electronically Signed   By: Lajean Manes M.D.   On: 11/21/2016 17:42   Dg Pelvis Portable  Result Date: 11/22/2016 CLINICAL DATA:  Recent fall EXAM: PORTABLE PELVIS 1-2 VIEWS COMPARISON:  None. FINDINGS: There is no evidence of pelvic fracture or diastasis. No pelvic bone lesions are seen. IMPRESSION: Negative. Electronically Signed   By: Franchot Gallo M.D.   On: 11/22/2016 08:23        Scheduled Meds: . ALPRAZolam   0.25 mg Oral TID  . atorvastatin  40 mg Oral q1800  . famotidine  20 mg Oral Daily  . felodipine  5 mg Oral Daily  . FLUoxetine  10 mg Oral Daily  . insulin aspart  0-9 Units Subcutaneous TID WC  . levothyroxine  50 mcg Oral QAC breakfast  . losartan  100 mg Oral Daily   Continuous Infusions:   LOS: 1 day    Time spent: 40 mins    THOMPSON,DANIEL, MD [facility] Pager [phone]  If 7PM-7AM, please contact night-coverage [email or web address] Password [email or web address] 11/23/2016, 1:05 PM

## 2016-11-24 DIAGNOSIS — S0003XA Contusion of scalp, initial encounter: Secondary | ICD-10-CM

## 2016-11-24 LAB — GLUCOSE, CAPILLARY
GLUCOSE-CAPILLARY: 131 mg/dL — AB (ref 65–99)
Glucose-Capillary: 95 mg/dL (ref 65–99)

## 2016-11-24 MED ORDER — MECLIZINE HCL 12.5 MG PO TABS: 12.5000 mg | ORAL_TABLET | Freq: Three times a day (TID) | ORAL | 0 refills | Status: DC | PRN

## 2016-11-24 NOTE — Progress Notes (Signed)
Pt d/c to home by car with family. Assessment stable. Prescription given. All questions answered 

## 2016-11-24 NOTE — Care Management Note (Addendum)
Case Management Note  Patient Details  Name: [name] MRN: [id number] Date of Birth: 05-May-1934  Subjective/Objective:    82 y.o. F to be discharged today.Had been followed for CIR admission which was declined.  CM received call from RN that pt would need HHPT and OT. Made referral To Jermaine with  [facility] after attempting to reach pt by phone without success. Also ordered RW. No further needs of which I am aware. Pt had expressed desire for Private duty Care Agency list.                 Action/Plan: Tubed list of Private duty agencies to [facility]. No further CM needs    Expected Discharge Date:                  Expected Discharge Plan:  [place]  In-House Referral:     Discharge planning Services  CM Consult  Post Acute Care Choice:  Durable Medical Equipment, Home Health Choice offered to:   (Unable to reach pt by phone after Bedside RN called this CM)  DME Arranged:  [facility] DME Agency:  [place] Arranged:  PT, OT [facility] Agency:  [place]  Status of Service:  Completed, signed off  If discussed at [place] of Stay Meetings, dates discussed:    Additional Comments:  Delrae Sawyers, RN 11/24/2016, 12:59 PM

## 2016-11-24 NOTE — Progress Notes (Signed)
Physical Therapy Treatment Patient Details Name: [name] MRN: [id number] DOB: 09-06-1934 Today's Date: 11/24/2016    History of Present Illness 82 y.o.femalewith history of diabetes mellitus type 2, hypertension, hyperlipidemia and hypothyroidism with history of TIA diagnosed in October this year presents tothe ER because of fall. Patient states she missed her step while getting water from the refrigerator and hit her head on the floor. Did not lose consciousness but started bleeding from the scalp. CT head did not show any intracranial bleed. Patient had at least 2 episodes of vomiting since episode and also has difficult gait and is admitted for postconcussive syndrome. Xray of pelvis and neck negative    PT Comments    Pt with minimal nausea this date and denies dizziness. Pt remains to have noted memory deficits, impaired safety awareness, and increased falls risk. Spoke with [name], pt's sister per patient request and she asked for guidance on how to personally hire 24/7 assist to stay with patient at her house. Pt receptive to that option. RN notified.   Follow Up Recommendations  Home health PT;Supervision/Assistance - 24 hour (if 24/7 assist can not be provided will needs SNF)     Equipment Recommendations  Rolling walker with 5" wheels    Recommendations for Other Services       Precautions / Restrictions Precautions Precautions: Fall Precaution Comments: per RN pt sundowning Restrictions Weight Bearing Restrictions: No    Mobility  Bed Mobility Overal bed mobility: Needs Assistance Bed Mobility: Supine to Sit     Supine to sit: Min guard     General bed mobility comments: increased time, no nausea  Transfers Overall transfer level: Needs assistance Equipment used: 1 person hand held assist Transfers: Sit to/from Stand Sit to Stand: Min assist         General transfer comment: min assist to steady pt, pt reaching for something to hold  onto  Ambulation/Gait Ambulation/Gait assistance: Min assist Ambulation Solis (Feet): 175 Feet Assistive device: Rolling walker (2 wheeled);None Gait Pattern/deviations: Step-through pattern;Staggering right;Wide base of support (occasional cross over gait pattern) Gait velocity: slow Gait velocity interpretation: Below normal speed for age/gender General Gait Details: pt unsteady without RW, pt with steady drift to the right requiring minA to maintain balance, pt given RW, pt able to ambulate down middle of hall with minimal drift. Pt however reports "i don't feel any different"   Stairs Stairs: Yes   Stair Management: One rail Right Number of Stairs: 5 General stair comments: step to, increased time  Wheelchair Mobility    Modified Rankin (Stroke Patients Only)       Balance Overall balance assessment: Needs assistance Sitting-balance support: Feet supported Sitting balance-Leahy Scale: Fair     Standing balance support: No upper extremity supported Standing balance-Leahy Scale: Poor                      Cognition Arousal/Alertness: Awake/alert Behavior During Therapy: WFL for tasks assessed/performed Overall Cognitive Status: Impaired/Different from baseline       Memory: Decreased short-term memory   Safety/Judgement: Decreased awareness of deficits;Decreased awareness of safety (believes she can amb without RW)     General Comments: pt appropriate with me during session however unable to recall date or name of memory care unit her husband is at. Per RN pt "sundowned" last night and became restless, trying to get up frequently, hallucinations    Exercises      General Comments General comments (skin integrity, edema,  etc.): discussed pt safety at home and she would need 24/7 assist or go to SNF, pt receptive. Attempted to call [name], patients sister per pt request however no response. Sister called back and spoke with PT.      Pertinent  Vitals/Pain Pain Assessment: No/denies pain    Home Living                      Prior Function            PT Goals (current goals can now be found in the care plan section) Acute Rehab PT Goals Patient Stated Goal: go home Progress towards PT goals: Progressing toward goals    Frequency    Min 3X/week      PT Plan Current plan remains appropriate    Co-evaluation             End of Session Equipment Utilized During Treatment: Gait belt Activity Tolerance: Patient tolerated treatment well Patient left: in chair;with call bell/phone within reach;with chair alarm set;with family/visitor present     Time: 0800-0823 PT Time Calculation (min) (ACUTE ONLY): 23 min  Charges:  $Gait Training: 8-22 mins $Therapeutic Activity: 8-22 mins                    G CodesKoleen Solis [name] Dec 19, 2016, 8:36 AM   [name], PT, DPT Pager #: [phone] Office #: [phone]

## 2016-11-24 NOTE — Discharge Summary (Signed)
Physician Discharge Summary  [name] [id number] DOB: June 25, 1934 DOA: 11/21/2016  PCP: Mathews Argyle, MD  Admit date: 11/21/2016 Discharge date: 11/24/2016  Time spent: 65 minutes  Recommendations for Outpatient Follow-up:  1. Follow-up with Mathews Argyle, MD in 2 weeks. Patient's scalp hematoma will need to be followed up upon.   Discharge Diagnoses:  Principal Problem:   Post concussive syndrome Active Problems:   Essential hypertension   Hypothyroidism   Controlled type 2 diabetes mellitus with complication, without long-term current use of insulin (HCC)   Post concussion syndrome   Concussion   Non-intractable vomiting with nausea   Discharge Condition: Stable and improved  Diet recommendation: carb modified diet.  Filed Weights   11/22/16 0148 11/24/16 0524  Weight: 55.1 kg (121 lb 8 oz) 56 kg (123 lb 6.4 oz)    History of present illness:  Per Dr Shirline Frees is a 82 y.o. female with history of diabetes mellitus type 2, hypertension, hyperlipidemia and hypothyroidism with history of TIA diagnosed in October this year presented to the ER because of fall. Patient stated she missed her step while getting water from the refrigerator and hit her head on the floor. Did not lose consciousness but started bleeding from the scalp. EMS was called and patient was brought to the ER. On exam patient has large scalp hematoma on the posterior aspect. CT head did not show any intracranial bleed. Patient had at least 2 episodes of vomiting since episode and also has difficult gait and was admitted for postconcussive syndrome. Patient has mild headache also. On exam patient appearred nonfocal.   ED Course: CT head did not show any intracranial bleed. CT C-spine did not show any fractures. EKG shows sinus bradycardia.   Hospital Course:  #1 postconcussive syndrome Patient noted to have some gait abnormalities as well as nausea and emesis. Nausea and  emesis resolved. Patient was seen by physical therapy during the hospitalization and improved on a daily basis such that physical therapy felt patient may benefit from inpatient rehabilitation. Patient's headache also improved. Patient had no further nausea or vomiting. Patient was evaluated by inpatient rehabilitation M.D. who felt that patient was stable and appropriate to go home with home health therapies. Patient will subsequently be discharged home with home health therapies in stable and improved condition with close outpatient follow-up.   #2 nausea vomiting dizziness May partly be secondary to problem #1. Improved during the hospitalization. Patient was placed on meclizine as needed.  #3 hypertension Stable. Continued on home regimen of felodipine and Cozaar.  #4 hypothyroidism Continued on home regimen of Synthroid.  #5 hyperlipidemia Continued on  Lipitor.  #6 well-controlled type 2 diabetes Hemoglobin A1c was 6.2 on 10/07/2016. Patient was maintained on sliding scale insulin throughout the hospitalization.  #7 scalp hematoma Outpatient follow-up.  Procedures:  CT Head and C spine 11/21/2016  Xray pelvis 11/22/2016   Consultations:  CIR: Dr Naaman Plummer 11/23/2016    Discharge Exam: Vitals:   11/24/16 0524 11/24/16 0931  BP: (!) 142/63 137/69  Pulse: (!) 58 63  Resp: 18 18  Temp: 98.1 F (36.7 C) 98.4 F (36.9 C)    General: NAD Cardiovascular: RRR Respiratory: CTAB  Discharge Instructions   Discharge Instructions    Diet Carb Modified    Complete by:  As directed    Increase activity slowly    Complete by:  As directed      Current Discharge Medication List    START taking these medications  Details  meclizine (ANTIVERT) 12.5 MG tablet Take 1 tablet (12.5 mg total) by mouth 3 (three) times daily as needed for dizziness or nausea. Qty: 20 tablet, Refills: 0      CONTINUE these medications which have NOT CHANGED   Details  acetaminophen  (TYLENOL) 325 MG tablet Take 1 tablet (325 mg total) by mouth every 6 (six) hours as needed for mild pain, fever or headache.    ALPRAZolam (XANAX) 0.25 MG tablet Take 0.25 mg by mouth 3 (three) times daily.    aspirin 325 MG tablet Take 1 tablet (325 mg total) by mouth daily.    atorvastatin (LIPITOR) 40 MG tablet Take 1 tablet (40 mg total) by mouth daily at 6 PM.    betamethasone valerate lotion (VALISONE) 0.1 % Apply 1 application topically daily as needed for irritation.    famotidine (PEPCID) 20 MG tablet Take 20 mg by mouth daily.    felodipine (PLENDIL) 5 MG 24 hr tablet Take 5 mg by mouth daily.    FLUoxetine (PROZAC) 10 MG capsule Take 1 capsule (10 mg total) by mouth daily. Qty: 30 capsule, Refills: 0    levothyroxine (SYNTHROID, LEVOTHROID) 50 MCG tablet Take 50 mcg by mouth daily before breakfast.    losartan (COZAAR) 100 MG tablet Take 1 tablet (100 mg total) by mouth daily. Qty: 30 tablet, Refills: 0    metFORMIN (GLUCOPHAGE-XR) 750 MG 24 hr tablet Take 750 mg by mouth at bedtime.       Allergies  Allergen Reactions  . Tramadol Anaphylaxis  . Morphine And Related Nausea And Vomiting  . Penicillins Swelling  . Tetracyclines & Related Swelling    Face and eyes swells  . Hydrocodone-Acetaminophen Other (See Comments)  . Sitagliptin Other (See Comments)   Follow-up Information    Mathews Argyle, MD. Schedule an appointment as soon as possible for a visit in 2 week(s).   Specialty:  Internal Medicine Contact information: [address]  [place] [address] [phone]            The results of significant diagnostics from this hospitalization (including imaging, microbiology, ancillary and laboratory) are listed below for reference.    Significant Diagnostic Studies: Ct Head Wo Contrast  Result Date: 11/21/2016 CLINICAL DATA:  Pt fell while in kitchen and hit head on corner of refrigerator. Pt states hit side and back of head.  Pt has bruising right frontal. Pt denies headache. Pt has some dizziness. Pt takes blood thinner. Pt denies neck pain. EXAM: CT HEAD WITHOUT CONTRAST CT CERVICAL SPINE WITHOUT CONTRAST TECHNIQUE: Multidetector CT imaging of the head and cervical spine was performed following the standard protocol without intravenous contrast. Multiplanar CT image reconstructions of the cervical spine were also generated. COMPARISON:  Brain MRI, 10/07/2016.  CT, 10/06/2016. FINDINGS: CT HEAD FINDINGS Brain: No evidence of acute infarction, hemorrhage, hydrocephalus, extra-axial collection or mass lesion/mass effect. The ventricles and sulci are enlarged reflecting mild to moderate generalized atrophy. Patchy white matter hypoattenuation is noted consistent with mild chronic microvascular ischemic change. Vascular: No hyperdense vessel or unexpected calcification. Skull: Normal. Negative for fracture or focal lesion. Sinuses/Orbits: No acute finding. Other: Right parietal scalp hematoma. CT CERVICAL SPINE FINDINGS Alignment: Normal. Skull base and vertebrae: No acute fracture. No primary bone lesion or focal pathologic process. Soft tissues and spinal canal: No prevertebral fluid or swelling. No visible canal hematoma. Disc levels: There are disc degenerative changes most evident at C3-C4, C5-C6 and C6-C7, most severe C6-C7. Diffuse disc  bulging and uncovertebral spurring causes neural foraminal narrowing, moderate bilaterally at C5-C6 and C6-C7. There are facet degenerative changes most apparent at C3-C4 and C4-C5. No convincing disc herniation. Upper chest: No acute or significant abnormality. Other: None IMPRESSION: HEAD CT: No acute intracranial abnormalities. No skull fracture. Right parietal scalp hematoma. CERVICAL CT:  No fracture or acute finding. Electronically Signed   By: Lajean Manes M.D.   On: 11/21/2016 17:42   Ct Cervical Spine Wo Contrast  Result Date: 11/21/2016 CLINICAL DATA:  Pt fell while in kitchen and hit  head on corner of refrigerator. Pt states hit side and back of head. Pt has bruising right frontal. Pt denies headache. Pt has some dizziness. Pt takes blood thinner. Pt denies neck pain. EXAM: CT HEAD WITHOUT CONTRAST CT CERVICAL SPINE WITHOUT CONTRAST TECHNIQUE: Multidetector CT imaging of the head and cervical spine was performed following the standard protocol without intravenous contrast. Multiplanar CT image reconstructions of the cervical spine were also generated. COMPARISON:  Brain MRI, 10/07/2016.  CT, 10/06/2016. FINDINGS: CT HEAD FINDINGS Brain: No evidence of acute infarction, hemorrhage, hydrocephalus, extra-axial collection or mass lesion/mass effect. The ventricles and sulci are enlarged reflecting mild to moderate generalized atrophy. Patchy white matter hypoattenuation is noted consistent with mild chronic microvascular ischemic change. Vascular: No hyperdense vessel or unexpected calcification. Skull: Normal. Negative for fracture or focal lesion. Sinuses/Orbits: No acute finding. Other: Right parietal scalp hematoma. CT CERVICAL SPINE FINDINGS Alignment: Normal. Skull base and vertebrae: No acute fracture. No primary bone lesion or focal pathologic process. Soft tissues and spinal canal: No prevertebral fluid or swelling. No visible canal hematoma. Disc levels: There are disc degenerative changes most evident at C3-C4, C5-C6 and C6-C7, most severe C6-C7. Diffuse disc bulging and uncovertebral spurring causes neural foraminal narrowing, moderate bilaterally at C5-C6 and C6-C7. There are facet degenerative changes most apparent at C3-C4 and C4-C5. No convincing disc herniation. Upper chest: No acute or significant abnormality. Other: None IMPRESSION: HEAD CT: No acute intracranial abnormalities. No skull fracture. Right parietal scalp hematoma. CERVICAL CT:  No fracture or acute finding. Electronically Signed   By: Lajean Manes M.D.   On: 11/21/2016 17:42   Dg Pelvis Portable  Result Date:  11/22/2016 CLINICAL DATA:  Recent fall EXAM: PORTABLE PELVIS 1-2 VIEWS COMPARISON:  None. FINDINGS: There is no evidence of pelvic fracture or diastasis. No pelvic bone lesions are seen. IMPRESSION: Negative. Electronically Signed   By: Franchot Gallo M.D.   On: 11/22/2016 08:23    Microbiology: No results found for this or any previous visit (from the past 240 hour(s)).   Labs: Basic Metabolic Panel:  Recent Labs Lab 11/21/16 2229 11/22/16 0600 11/23/16 0212  NA 133* 135 137  K 4.1 4.0 3.8  CL 101 104 107  CO2 24 24 22   GLUCOSE 135* 100* 87  BUN 13 12 9   CREATININE 0.94 0.85 0.81  CALCIUM 8.9 8.3* 8.5*   Liver Function Tests:  Recent Labs Lab 11/21/16 2229  AST 28  ALT 22  ALKPHOS 59  BILITOT 0.7  PROT 6.7  ALBUMIN 3.6   No results for input(s): LIPASE, AMYLASE in the last 168 hours. No results for input(s): AMMONIA in the last 168 hours. CBC:  Recent Labs Lab 11/21/16 2229 11/22/16 0600 11/23/16 0212  WBC 6.0 4.4 3.6*  NEUTROABS 4.4  --   --   HGB 11.3* 9.9* 10.1*  HCT 32.6* 29.2* 29.8*  MCV 94.8 95.1 96.1  PLT 191 171 188  Cardiac Enzymes:  Recent Labs Lab 11/22/16 0600  TROPONINI <0.03   BNP: BNP (last 3 results) No results for input(s): BNP in the last 8760 hours.  ProBNP (last 3 results) No results for input(s): PROBNP in the last 8760 hours.  CBG:  Recent Labs Lab 11/23/16 1129 11/23/16 1610 11/23/16 2122 11/24/16 0656 11/24/16 1120  GLUCAP 99 111* 102* 95 131*       Signed:  Xan Ingraham MD.  [facility] 11/24/2016, 12:40 PM

## 2016-11-26 NOTE — Therapy (Signed)
[facility] [address] [place], [place], [address] Phone: [phone]   Fax:  [phone]  Patient Details  Name: [name] MRN: [id number] Date of Birth: Jun 02, 1934 Referring Provider:  No ref. provider found  Encounter Date: 11/26/2016  PHYSICAL THERAPY DISCHARGE SUMMARY  Visits from Start of Care: 1  Current functional level related to goals / functional outcomes:     PT Long Term Goals - 10/23/16 1014      PT LONG TERM GOAL #1   Title Pt will verbalize understanding of fall prevention strategies. TARGET DATE FOR ALL LTGS: 11/19/16   Status New     PT LONG TERM GOAL #2   Title Pt will be IND in HEP to improve strength and balance.   Status New     PT LONG TERM GOAL #3   Title Pt will amb. 1000' over even/uneven terrain, IND, to improve functional mobility.    Status New     PT LONG TERM GOAL #4   Title Pt will improve FGA score to >/=28/30 to decr. falls risk.    Status New        Remaining deficits: Unknown, as pt neve returned after eval 2/2 hospitalization and now receiving HHPT.   Education / Equipment: PT POC  Plan: Patient agrees to discharge.  Patient goals were not met. Patient is being discharged due to a change in medical status.  ?????       Kolbe Delmonaco L 11/26/2016, 4:04 PM  [facility] [address] [place], [place], [address] Phone: [phone]   Fax:  [phone]   Geoffry Paradise, PT,DPT 11/26/16 4:05 PM Phone: [phone] Fax: [phone]

## 2016-11-26 NOTE — Therapy (Signed)
[facility] [address] [place], [place], [address] Phone: [phone]   Fax:  [phone]  Patient Details  Name: DIANY FORMOSA MRN: [id number] Date of Birth: [birth year] Referring Provider:  No ref. provider found  Encounter Date: 11/26/2016  SPEECH THERAPY DISCHARGE SUMMARY  Visits from Start of Care: one (eval)   Current functional level related to goals / functional outcomes: Pt called to cancel all ST appointments due to receiving HHST.   Remaining deficits: Assumed all deficits remain.     Plan: Patient agrees to discharge.  Patient goals were not met. Patient is being discharged due to not returning since the last visit.  ?????       [place] ,[place], CCC-SLP  11/26/2016, 2:03 PM  [facility] [address] [place], [place], [address] Phone: [phone]   Fax:  [phone]

## 2016-11-27 ENCOUNTER — Ambulatory Visit: Payer: Medicare Other | Admitting: Occupational Therapy

## 2016-11-27 ENCOUNTER — Ambulatory Visit: Payer: Medicare Other | Admitting: Physical Therapy

## 2016-11-27 ENCOUNTER — Ambulatory Visit: Payer: Medicare Other

## 2016-11-27 ENCOUNTER — Encounter: Payer: Self-pay | Admitting: Occupational Therapy

## 2016-11-27 NOTE — Therapy (Signed)
[facility] [address]  [place], [place], [address] Phone: [phone]   Fax:  [phone]  Patient Details  Name: JERLYN PAIN MRN: [id number] Date of Birth: [birth year] Referring Provider:  Dr. Delice Lesch  Encounter Date: 11/27/2016  OCCUPATIONAL THERAPY DISCHARGE SUMMARY  Visits from Start of Care: 2  Current functional level related to goals / functional outcomes: Pt did not meet any goals secondary to not returning to O.T. After 2nd visit. Pt was hospitalized 11/21/16 and now receiving home health services   Remaining deficits: unknown   Education / Equipment: Memory strategies  Plan: Patient agrees to discharge.  Patient goals were not met. Patient is being discharged due to a change in medical status.  ?????       Carey Bullocks, OTR/L 11/27/2016, 9:50 AM  [facility] [address]  [place], [place], [address] Phone: [phone]   Fax:  [phone]

## 2016-11-28 ENCOUNTER — Encounter: Payer: Medicare Other | Admitting: Occupational Therapy

## 2016-11-28 ENCOUNTER — Ambulatory Visit: Payer: Medicare Other

## 2016-11-28 DIAGNOSIS — Z8673 Personal history of transient ischemic attack (TIA), and cerebral infarction without residual deficits: Secondary | ICD-10-CM | POA: Diagnosis not present

## 2016-11-28 DIAGNOSIS — E119 Type 2 diabetes mellitus without complications: Secondary | ICD-10-CM | POA: Diagnosis not present

## 2016-11-28 DIAGNOSIS — E039 Hypothyroidism, unspecified: Secondary | ICD-10-CM | POA: Diagnosis not present

## 2016-11-28 DIAGNOSIS — F0781 Postconcussional syndrome: Secondary | ICD-10-CM | POA: Diagnosis not present

## 2016-11-28 DIAGNOSIS — I10 Essential (primary) hypertension: Secondary | ICD-10-CM | POA: Diagnosis not present

## 2016-11-28 DIAGNOSIS — C50912 Malignant neoplasm of unspecified site of left female breast: Secondary | ICD-10-CM | POA: Diagnosis not present

## 2016-11-28 DIAGNOSIS — S0003XD Contusion of scalp, subsequent encounter: Secondary | ICD-10-CM | POA: Diagnosis not present

## 2016-11-28 DIAGNOSIS — Z7984 Long term (current) use of oral hypoglycemic drugs: Secondary | ICD-10-CM | POA: Diagnosis not present

## 2016-11-28 DIAGNOSIS — Z7982 Long term (current) use of aspirin: Secondary | ICD-10-CM | POA: Diagnosis not present

## 2016-11-28 DIAGNOSIS — E785 Hyperlipidemia, unspecified: Secondary | ICD-10-CM | POA: Diagnosis not present

## 2016-11-28 DIAGNOSIS — Z9181 History of falling: Secondary | ICD-10-CM | POA: Diagnosis not present

## 2016-11-28 DIAGNOSIS — K219 Gastro-esophageal reflux disease without esophagitis: Secondary | ICD-10-CM | POA: Diagnosis not present

## 2016-11-29 DIAGNOSIS — E119 Type 2 diabetes mellitus without complications: Secondary | ICD-10-CM | POA: Diagnosis not present

## 2016-11-29 DIAGNOSIS — C50912 Malignant neoplasm of unspecified site of left female breast: Secondary | ICD-10-CM | POA: Diagnosis not present

## 2016-11-29 DIAGNOSIS — S0003XD Contusion of scalp, subsequent encounter: Secondary | ICD-10-CM | POA: Diagnosis not present

## 2016-11-29 DIAGNOSIS — E039 Hypothyroidism, unspecified: Secondary | ICD-10-CM | POA: Diagnosis not present

## 2016-11-29 DIAGNOSIS — F0781 Postconcussional syndrome: Secondary | ICD-10-CM | POA: Diagnosis not present

## 2016-11-29 DIAGNOSIS — I10 Essential (primary) hypertension: Secondary | ICD-10-CM | POA: Diagnosis not present

## 2016-11-30 DIAGNOSIS — E039 Hypothyroidism, unspecified: Secondary | ICD-10-CM | POA: Diagnosis not present

## 2016-11-30 DIAGNOSIS — E119 Type 2 diabetes mellitus without complications: Secondary | ICD-10-CM | POA: Diagnosis not present

## 2016-11-30 DIAGNOSIS — F0781 Postconcussional syndrome: Secondary | ICD-10-CM | POA: Diagnosis not present

## 2016-11-30 DIAGNOSIS — C50912 Malignant neoplasm of unspecified site of left female breast: Secondary | ICD-10-CM | POA: Diagnosis not present

## 2016-11-30 DIAGNOSIS — S0003XD Contusion of scalp, subsequent encounter: Secondary | ICD-10-CM | POA: Diagnosis not present

## 2016-11-30 DIAGNOSIS — I10 Essential (primary) hypertension: Secondary | ICD-10-CM | POA: Diagnosis not present

## 2016-12-02 DIAGNOSIS — E039 Hypothyroidism, unspecified: Secondary | ICD-10-CM | POA: Diagnosis not present

## 2016-12-02 DIAGNOSIS — C50912 Malignant neoplasm of unspecified site of left female breast: Secondary | ICD-10-CM | POA: Diagnosis not present

## 2016-12-02 DIAGNOSIS — E119 Type 2 diabetes mellitus without complications: Secondary | ICD-10-CM | POA: Diagnosis not present

## 2016-12-02 DIAGNOSIS — I10 Essential (primary) hypertension: Secondary | ICD-10-CM | POA: Diagnosis not present

## 2016-12-02 DIAGNOSIS — F0781 Postconcussional syndrome: Secondary | ICD-10-CM | POA: Diagnosis not present

## 2016-12-02 DIAGNOSIS — S0003XD Contusion of scalp, subsequent encounter: Secondary | ICD-10-CM | POA: Diagnosis not present

## 2016-12-03 ENCOUNTER — Ambulatory Visit: Payer: Medicare Other

## 2016-12-03 ENCOUNTER — Encounter: Payer: Medicare Other | Admitting: Occupational Therapy

## 2016-12-04 DIAGNOSIS — E039 Hypothyroidism, unspecified: Secondary | ICD-10-CM | POA: Diagnosis not present

## 2016-12-04 DIAGNOSIS — C50912 Malignant neoplasm of unspecified site of left female breast: Secondary | ICD-10-CM | POA: Diagnosis not present

## 2016-12-04 DIAGNOSIS — E119 Type 2 diabetes mellitus without complications: Secondary | ICD-10-CM | POA: Diagnosis not present

## 2016-12-04 DIAGNOSIS — S0003XD Contusion of scalp, subsequent encounter: Secondary | ICD-10-CM | POA: Diagnosis not present

## 2016-12-04 DIAGNOSIS — F0781 Postconcussional syndrome: Secondary | ICD-10-CM | POA: Diagnosis not present

## 2016-12-04 DIAGNOSIS — I10 Essential (primary) hypertension: Secondary | ICD-10-CM | POA: Diagnosis not present

## 2016-12-05 ENCOUNTER — Encounter: Payer: Medicare Other | Admitting: Occupational Therapy

## 2016-12-05 ENCOUNTER — Ambulatory Visit: Payer: Medicare Other | Admitting: Psychology

## 2016-12-05 ENCOUNTER — Ambulatory Visit: Payer: Medicare Other | Admitting: Physical Therapy

## 2016-12-05 DIAGNOSIS — C50912 Malignant neoplasm of unspecified site of left female breast: Secondary | ICD-10-CM | POA: Diagnosis not present

## 2016-12-05 DIAGNOSIS — S0003XD Contusion of scalp, subsequent encounter: Secondary | ICD-10-CM | POA: Diagnosis not present

## 2016-12-05 DIAGNOSIS — E039 Hypothyroidism, unspecified: Secondary | ICD-10-CM | POA: Diagnosis not present

## 2016-12-05 DIAGNOSIS — E119 Type 2 diabetes mellitus without complications: Secondary | ICD-10-CM | POA: Diagnosis not present

## 2016-12-05 DIAGNOSIS — F0781 Postconcussional syndrome: Secondary | ICD-10-CM | POA: Diagnosis not present

## 2016-12-05 DIAGNOSIS — I10 Essential (primary) hypertension: Secondary | ICD-10-CM | POA: Diagnosis not present

## 2016-12-09 DIAGNOSIS — S0003XD Contusion of scalp, subsequent encounter: Secondary | ICD-10-CM | POA: Diagnosis not present

## 2016-12-09 DIAGNOSIS — F0781 Postconcussional syndrome: Secondary | ICD-10-CM | POA: Diagnosis not present

## 2016-12-09 DIAGNOSIS — C50912 Malignant neoplasm of unspecified site of left female breast: Secondary | ICD-10-CM | POA: Diagnosis not present

## 2016-12-09 DIAGNOSIS — I10 Essential (primary) hypertension: Secondary | ICD-10-CM | POA: Diagnosis not present

## 2016-12-09 DIAGNOSIS — E039 Hypothyroidism, unspecified: Secondary | ICD-10-CM | POA: Diagnosis not present

## 2016-12-09 DIAGNOSIS — E119 Type 2 diabetes mellitus without complications: Secondary | ICD-10-CM | POA: Diagnosis not present

## 2016-12-10 ENCOUNTER — Ambulatory Visit: Payer: Medicare Other | Admitting: Physical Therapy

## 2016-12-10 ENCOUNTER — Encounter: Payer: Medicare Other | Admitting: Occupational Therapy

## 2016-12-10 DIAGNOSIS — I10 Essential (primary) hypertension: Secondary | ICD-10-CM | POA: Diagnosis not present

## 2016-12-10 DIAGNOSIS — C50912 Malignant neoplasm of unspecified site of left female breast: Secondary | ICD-10-CM | POA: Diagnosis not present

## 2016-12-10 DIAGNOSIS — S0003XD Contusion of scalp, subsequent encounter: Secondary | ICD-10-CM | POA: Diagnosis not present

## 2016-12-10 DIAGNOSIS — E039 Hypothyroidism, unspecified: Secondary | ICD-10-CM | POA: Diagnosis not present

## 2016-12-10 DIAGNOSIS — F0781 Postconcussional syndrome: Secondary | ICD-10-CM | POA: Diagnosis not present

## 2016-12-10 DIAGNOSIS — E119 Type 2 diabetes mellitus without complications: Secondary | ICD-10-CM | POA: Diagnosis not present

## 2016-12-11 DIAGNOSIS — F0781 Postconcussional syndrome: Secondary | ICD-10-CM | POA: Diagnosis not present

## 2016-12-11 DIAGNOSIS — C50912 Malignant neoplasm of unspecified site of left female breast: Secondary | ICD-10-CM | POA: Diagnosis not present

## 2016-12-11 DIAGNOSIS — E119 Type 2 diabetes mellitus without complications: Secondary | ICD-10-CM | POA: Diagnosis not present

## 2016-12-11 DIAGNOSIS — I10 Essential (primary) hypertension: Secondary | ICD-10-CM | POA: Diagnosis not present

## 2016-12-11 DIAGNOSIS — S0003XD Contusion of scalp, subsequent encounter: Secondary | ICD-10-CM | POA: Diagnosis not present

## 2016-12-11 DIAGNOSIS — E039 Hypothyroidism, unspecified: Secondary | ICD-10-CM | POA: Diagnosis not present

## 2016-12-12 ENCOUNTER — Ambulatory Visit: Payer: Medicare Other | Admitting: Physical Therapy

## 2016-12-12 ENCOUNTER — Encounter: Payer: Medicare Other | Admitting: Occupational Therapy

## 2016-12-12 DIAGNOSIS — C50912 Malignant neoplasm of unspecified site of left female breast: Secondary | ICD-10-CM | POA: Diagnosis not present

## 2016-12-12 DIAGNOSIS — E039 Hypothyroidism, unspecified: Secondary | ICD-10-CM | POA: Diagnosis not present

## 2016-12-12 DIAGNOSIS — I10 Essential (primary) hypertension: Secondary | ICD-10-CM | POA: Diagnosis not present

## 2016-12-12 DIAGNOSIS — F0781 Postconcussional syndrome: Secondary | ICD-10-CM | POA: Diagnosis not present

## 2016-12-12 DIAGNOSIS — E119 Type 2 diabetes mellitus without complications: Secondary | ICD-10-CM | POA: Diagnosis not present

## 2016-12-12 DIAGNOSIS — S0003XD Contusion of scalp, subsequent encounter: Secondary | ICD-10-CM | POA: Diagnosis not present

## 2016-12-13 DIAGNOSIS — C50912 Malignant neoplasm of unspecified site of left female breast: Secondary | ICD-10-CM | POA: Diagnosis not present

## 2016-12-13 DIAGNOSIS — F0781 Postconcussional syndrome: Secondary | ICD-10-CM | POA: Diagnosis not present

## 2016-12-13 DIAGNOSIS — E119 Type 2 diabetes mellitus without complications: Secondary | ICD-10-CM | POA: Diagnosis not present

## 2016-12-13 DIAGNOSIS — I10 Essential (primary) hypertension: Secondary | ICD-10-CM | POA: Diagnosis not present

## 2016-12-13 DIAGNOSIS — S0003XD Contusion of scalp, subsequent encounter: Secondary | ICD-10-CM | POA: Diagnosis not present

## 2016-12-13 DIAGNOSIS — E039 Hypothyroidism, unspecified: Secondary | ICD-10-CM | POA: Diagnosis not present

## 2016-12-16 DIAGNOSIS — I10 Essential (primary) hypertension: Secondary | ICD-10-CM | POA: Diagnosis not present

## 2016-12-16 DIAGNOSIS — C50912 Malignant neoplasm of unspecified site of left female breast: Secondary | ICD-10-CM | POA: Diagnosis not present

## 2016-12-16 DIAGNOSIS — E039 Hypothyroidism, unspecified: Secondary | ICD-10-CM | POA: Diagnosis not present

## 2016-12-16 DIAGNOSIS — S0003XD Contusion of scalp, subsequent encounter: Secondary | ICD-10-CM | POA: Diagnosis not present

## 2016-12-16 DIAGNOSIS — E119 Type 2 diabetes mellitus without complications: Secondary | ICD-10-CM | POA: Diagnosis not present

## 2016-12-16 DIAGNOSIS — F0781 Postconcussional syndrome: Secondary | ICD-10-CM | POA: Diagnosis not present

## 2016-12-17 DIAGNOSIS — E1121 Type 2 diabetes mellitus with diabetic nephropathy: Secondary | ICD-10-CM | POA: Diagnosis not present

## 2016-12-17 DIAGNOSIS — N183 Chronic kidney disease, stage 3 (moderate): Secondary | ICD-10-CM | POA: Diagnosis not present

## 2016-12-17 DIAGNOSIS — I129 Hypertensive chronic kidney disease with stage 1 through stage 4 chronic kidney disease, or unspecified chronic kidney disease: Secondary | ICD-10-CM | POA: Diagnosis not present

## 2016-12-17 DIAGNOSIS — H8110 Benign paroxysmal vertigo, unspecified ear: Secondary | ICD-10-CM | POA: Diagnosis not present

## 2016-12-17 DIAGNOSIS — D649 Anemia, unspecified: Secondary | ICD-10-CM | POA: Diagnosis not present

## 2016-12-17 DIAGNOSIS — E11319 Type 2 diabetes mellitus with unspecified diabetic retinopathy without macular edema: Secondary | ICD-10-CM | POA: Diagnosis not present

## 2016-12-17 DIAGNOSIS — Z79899 Other long term (current) drug therapy: Secondary | ICD-10-CM | POA: Diagnosis not present

## 2016-12-18 DIAGNOSIS — E119 Type 2 diabetes mellitus without complications: Secondary | ICD-10-CM | POA: Diagnosis not present

## 2016-12-18 DIAGNOSIS — E039 Hypothyroidism, unspecified: Secondary | ICD-10-CM | POA: Diagnosis not present

## 2016-12-18 DIAGNOSIS — F0781 Postconcussional syndrome: Secondary | ICD-10-CM | POA: Diagnosis not present

## 2016-12-18 DIAGNOSIS — C50912 Malignant neoplasm of unspecified site of left female breast: Secondary | ICD-10-CM | POA: Diagnosis not present

## 2016-12-18 DIAGNOSIS — S0003XD Contusion of scalp, subsequent encounter: Secondary | ICD-10-CM | POA: Diagnosis not present

## 2016-12-18 DIAGNOSIS — I10 Essential (primary) hypertension: Secondary | ICD-10-CM | POA: Diagnosis not present

## 2016-12-19 DIAGNOSIS — E039 Hypothyroidism, unspecified: Secondary | ICD-10-CM | POA: Diagnosis not present

## 2016-12-19 DIAGNOSIS — E119 Type 2 diabetes mellitus without complications: Secondary | ICD-10-CM | POA: Diagnosis not present

## 2016-12-19 DIAGNOSIS — F0781 Postconcussional syndrome: Secondary | ICD-10-CM | POA: Diagnosis not present

## 2016-12-19 DIAGNOSIS — S0003XD Contusion of scalp, subsequent encounter: Secondary | ICD-10-CM | POA: Diagnosis not present

## 2016-12-19 DIAGNOSIS — C50912 Malignant neoplasm of unspecified site of left female breast: Secondary | ICD-10-CM | POA: Diagnosis not present

## 2016-12-19 DIAGNOSIS — I10 Essential (primary) hypertension: Secondary | ICD-10-CM | POA: Diagnosis not present

## 2016-12-20 DIAGNOSIS — I10 Essential (primary) hypertension: Secondary | ICD-10-CM | POA: Diagnosis not present

## 2016-12-20 DIAGNOSIS — E119 Type 2 diabetes mellitus without complications: Secondary | ICD-10-CM | POA: Diagnosis not present

## 2016-12-20 DIAGNOSIS — F0781 Postconcussional syndrome: Secondary | ICD-10-CM | POA: Diagnosis not present

## 2016-12-20 DIAGNOSIS — C50912 Malignant neoplasm of unspecified site of left female breast: Secondary | ICD-10-CM | POA: Diagnosis not present

## 2016-12-20 DIAGNOSIS — S0003XD Contusion of scalp, subsequent encounter: Secondary | ICD-10-CM | POA: Diagnosis not present

## 2016-12-20 DIAGNOSIS — E039 Hypothyroidism, unspecified: Secondary | ICD-10-CM | POA: Diagnosis not present

## 2016-12-24 DIAGNOSIS — C50912 Malignant neoplasm of unspecified site of left female breast: Secondary | ICD-10-CM | POA: Diagnosis not present

## 2016-12-24 DIAGNOSIS — E119 Type 2 diabetes mellitus without complications: Secondary | ICD-10-CM | POA: Diagnosis not present

## 2016-12-24 DIAGNOSIS — F0781 Postconcussional syndrome: Secondary | ICD-10-CM | POA: Diagnosis not present

## 2016-12-24 DIAGNOSIS — S0003XD Contusion of scalp, subsequent encounter: Secondary | ICD-10-CM | POA: Diagnosis not present

## 2016-12-24 DIAGNOSIS — E039 Hypothyroidism, unspecified: Secondary | ICD-10-CM | POA: Diagnosis not present

## 2016-12-24 DIAGNOSIS — I10 Essential (primary) hypertension: Secondary | ICD-10-CM | POA: Diagnosis not present

## 2016-12-25 ENCOUNTER — Ambulatory Visit: Payer: Medicare Other | Admitting: Neurology

## 2016-12-25 DIAGNOSIS — F0781 Postconcussional syndrome: Secondary | ICD-10-CM | POA: Diagnosis not present

## 2016-12-25 DIAGNOSIS — C50912 Malignant neoplasm of unspecified site of left female breast: Secondary | ICD-10-CM | POA: Diagnosis not present

## 2016-12-25 DIAGNOSIS — E119 Type 2 diabetes mellitus without complications: Secondary | ICD-10-CM | POA: Diagnosis not present

## 2016-12-25 DIAGNOSIS — E039 Hypothyroidism, unspecified: Secondary | ICD-10-CM | POA: Diagnosis not present

## 2016-12-25 DIAGNOSIS — S0003XD Contusion of scalp, subsequent encounter: Secondary | ICD-10-CM | POA: Diagnosis not present

## 2016-12-25 DIAGNOSIS — I10 Essential (primary) hypertension: Secondary | ICD-10-CM | POA: Diagnosis not present

## 2016-12-26 DIAGNOSIS — I10 Essential (primary) hypertension: Secondary | ICD-10-CM | POA: Diagnosis not present

## 2016-12-26 DIAGNOSIS — E039 Hypothyroidism, unspecified: Secondary | ICD-10-CM | POA: Diagnosis not present

## 2016-12-26 DIAGNOSIS — C50912 Malignant neoplasm of unspecified site of left female breast: Secondary | ICD-10-CM | POA: Diagnosis not present

## 2016-12-26 DIAGNOSIS — F0781 Postconcussional syndrome: Secondary | ICD-10-CM | POA: Diagnosis not present

## 2016-12-26 DIAGNOSIS — S0003XD Contusion of scalp, subsequent encounter: Secondary | ICD-10-CM | POA: Diagnosis not present

## 2016-12-26 DIAGNOSIS — E119 Type 2 diabetes mellitus without complications: Secondary | ICD-10-CM | POA: Diagnosis not present

## 2016-12-27 DIAGNOSIS — F0781 Postconcussional syndrome: Secondary | ICD-10-CM | POA: Diagnosis not present

## 2016-12-27 DIAGNOSIS — S0003XD Contusion of scalp, subsequent encounter: Secondary | ICD-10-CM | POA: Diagnosis not present

## 2016-12-27 DIAGNOSIS — E119 Type 2 diabetes mellitus without complications: Secondary | ICD-10-CM | POA: Diagnosis not present

## 2016-12-27 DIAGNOSIS — C50912 Malignant neoplasm of unspecified site of left female breast: Secondary | ICD-10-CM | POA: Diagnosis not present

## 2016-12-27 DIAGNOSIS — I10 Essential (primary) hypertension: Secondary | ICD-10-CM | POA: Diagnosis not present

## 2016-12-27 DIAGNOSIS — E039 Hypothyroidism, unspecified: Secondary | ICD-10-CM | POA: Diagnosis not present

## 2017-01-02 DIAGNOSIS — S0003XD Contusion of scalp, subsequent encounter: Secondary | ICD-10-CM | POA: Diagnosis not present

## 2017-01-02 DIAGNOSIS — E039 Hypothyroidism, unspecified: Secondary | ICD-10-CM | POA: Diagnosis not present

## 2017-01-02 DIAGNOSIS — F0781 Postconcussional syndrome: Secondary | ICD-10-CM | POA: Diagnosis not present

## 2017-01-02 DIAGNOSIS — C50912 Malignant neoplasm of unspecified site of left female breast: Secondary | ICD-10-CM | POA: Diagnosis not present

## 2017-01-02 DIAGNOSIS — I10 Essential (primary) hypertension: Secondary | ICD-10-CM | POA: Diagnosis not present

## 2017-01-02 DIAGNOSIS — E119 Type 2 diabetes mellitus without complications: Secondary | ICD-10-CM | POA: Diagnosis not present

## 2017-01-03 DIAGNOSIS — I129 Hypertensive chronic kidney disease with stage 1 through stage 4 chronic kidney disease, or unspecified chronic kidney disease: Secondary | ICD-10-CM | POA: Diagnosis not present

## 2017-01-16 ENCOUNTER — Ambulatory Visit (INDEPENDENT_AMBULATORY_CARE_PROVIDER_SITE_OTHER): Payer: Medicare Other | Admitting: Psychology

## 2017-01-16 DIAGNOSIS — F4323 Adjustment disorder with mixed anxiety and depressed mood: Secondary | ICD-10-CM | POA: Diagnosis not present

## 2017-01-23 ENCOUNTER — Encounter: Payer: Medicare Other | Admitting: Physical Medicine & Rehabilitation

## 2017-02-06 DIAGNOSIS — Z1389 Encounter for screening for other disorder: Secondary | ICD-10-CM | POA: Diagnosis not present

## 2017-02-06 DIAGNOSIS — F321 Major depressive disorder, single episode, moderate: Secondary | ICD-10-CM | POA: Diagnosis not present

## 2017-02-06 DIAGNOSIS — Z0000 Encounter for general adult medical examination without abnormal findings: Secondary | ICD-10-CM | POA: Diagnosis not present

## 2017-02-12 ENCOUNTER — Other Ambulatory Visit: Payer: Self-pay | Admitting: Geriatric Medicine

## 2017-02-12 DIAGNOSIS — Z1231 Encounter for screening mammogram for malignant neoplasm of breast: Secondary | ICD-10-CM

## 2017-02-20 ENCOUNTER — Ambulatory Visit: Payer: Medicare Other | Admitting: Psychology

## 2017-02-20 ENCOUNTER — Ambulatory Visit (INDEPENDENT_AMBULATORY_CARE_PROVIDER_SITE_OTHER): Payer: Medicare Other | Admitting: Psychology

## 2017-02-20 DIAGNOSIS — F4323 Adjustment disorder with mixed anxiety and depressed mood: Secondary | ICD-10-CM | POA: Diagnosis not present

## 2017-02-27 ENCOUNTER — Other Ambulatory Visit: Payer: Self-pay | Admitting: Physical Medicine and Rehabilitation

## 2017-03-03 DIAGNOSIS — E118 Type 2 diabetes mellitus with unspecified complications: Secondary | ICD-10-CM | POA: Diagnosis not present

## 2017-03-03 DIAGNOSIS — H35363 Drusen (degenerative) of macula, bilateral: Secondary | ICD-10-CM | POA: Diagnosis not present

## 2017-03-19 ENCOUNTER — Ambulatory Visit: Payer: Medicare Other | Admitting: Psychology

## 2017-03-20 ENCOUNTER — Ambulatory Visit
Admission: RE | Admit: 2017-03-20 | Discharge: 2017-03-20 | Disposition: A | Discharge status: Home / Self Care | Payer: Medicare Other | Source: Ambulatory Visit | Attending: Geriatric Medicine | Admitting: Geriatric Medicine

## 2017-03-20 DIAGNOSIS — Z1231 Encounter for screening mammogram for malignant neoplasm of breast: Secondary | ICD-10-CM | POA: Diagnosis not present

## 2017-03-20 HISTORY — DX: Malignant neoplasm of unspecified site of unspecified female breast: C50.919

## 2017-03-20 HISTORY — DX: Personal history of irradiation: Z92.3

## 2017-04-07 DIAGNOSIS — H35363 Drusen (degenerative) of macula, bilateral: Secondary | ICD-10-CM | POA: Diagnosis not present

## 2017-04-24 DIAGNOSIS — E11319 Type 2 diabetes mellitus with unspecified diabetic retinopathy without macular edema: Secondary | ICD-10-CM | POA: Diagnosis not present

## 2017-04-24 DIAGNOSIS — I129 Hypertensive chronic kidney disease with stage 1 through stage 4 chronic kidney disease, or unspecified chronic kidney disease: Secondary | ICD-10-CM | POA: Diagnosis not present

## 2017-04-24 DIAGNOSIS — Z7984 Long term (current) use of oral hypoglycemic drugs: Secondary | ICD-10-CM | POA: Diagnosis not present

## 2017-04-24 DIAGNOSIS — E1121 Type 2 diabetes mellitus with diabetic nephropathy: Secondary | ICD-10-CM | POA: Diagnosis not present

## 2017-04-24 DIAGNOSIS — N183 Chronic kidney disease, stage 3 (moderate): Secondary | ICD-10-CM | POA: Diagnosis not present

## 2017-04-24 DIAGNOSIS — F321 Major depressive disorder, single episode, moderate: Secondary | ICD-10-CM | POA: Diagnosis not present

## 2017-04-30 DIAGNOSIS — N644 Mastodynia: Secondary | ICD-10-CM | POA: Diagnosis not present

## 2017-04-30 DIAGNOSIS — N6320 Unspecified lump in the left breast, unspecified quadrant: Secondary | ICD-10-CM | POA: Diagnosis not present

## 2017-05-06 DIAGNOSIS — N183 Chronic kidney disease, stage 3 (moderate): Secondary | ICD-10-CM | POA: Diagnosis not present

## 2017-05-06 DIAGNOSIS — Z853 Personal history of malignant neoplasm of breast: Secondary | ICD-10-CM | POA: Diagnosis not present

## 2017-05-06 DIAGNOSIS — Z90722 Acquired absence of ovaries, bilateral: Secondary | ICD-10-CM | POA: Diagnosis not present

## 2017-05-06 DIAGNOSIS — I10 Essential (primary) hypertension: Secondary | ICD-10-CM | POA: Diagnosis not present

## 2017-05-06 DIAGNOSIS — Z9079 Acquired absence of other genital organ(s): Secondary | ICD-10-CM | POA: Diagnosis not present

## 2017-05-06 DIAGNOSIS — Z923 Personal history of irradiation: Secondary | ICD-10-CM | POA: Diagnosis not present

## 2017-05-06 DIAGNOSIS — N6320 Unspecified lump in the left breast, unspecified quadrant: Secondary | ICD-10-CM | POA: Diagnosis not present

## 2017-05-06 DIAGNOSIS — E119 Type 2 diabetes mellitus without complications: Secondary | ICD-10-CM | POA: Diagnosis not present

## 2017-05-06 DIAGNOSIS — Z90710 Acquired absence of both cervix and uterus: Secondary | ICD-10-CM | POA: Diagnosis not present

## 2017-05-06 DIAGNOSIS — I639 Cerebral infarction, unspecified: Secondary | ICD-10-CM | POA: Diagnosis not present

## 2017-05-09 ENCOUNTER — Other Ambulatory Visit: Payer: Self-pay | Admitting: General Surgery

## 2017-05-09 DIAGNOSIS — Z853 Personal history of malignant neoplasm of breast: Secondary | ICD-10-CM

## 2017-05-21 ENCOUNTER — Ambulatory Visit
Admission: RE | Admit: 2017-05-21 | Discharge: 2017-05-21 | Disposition: A | Discharge status: Home / Self Care | Payer: Medicare Other | Source: Ambulatory Visit | Attending: General Surgery | Admitting: General Surgery

## 2017-05-21 DIAGNOSIS — Z853 Personal history of malignant neoplasm of breast: Secondary | ICD-10-CM

## 2017-05-21 DIAGNOSIS — N6322 Unspecified lump in the left breast, upper inner quadrant: Secondary | ICD-10-CM | POA: Diagnosis not present

## 2017-05-21 MED ORDER — GADOBENATE DIMEGLUMINE 529 MG/ML IV SOLN
10.0000 mL | Freq: Once | INTRAVENOUS | Status: AC | PRN
  Administered 2017-05-21: 10 mL via INTRAVENOUS

## 2017-05-22 ENCOUNTER — Other Ambulatory Visit: Payer: Self-pay

## 2017-05-30 ENCOUNTER — Other Ambulatory Visit: Payer: Self-pay | Admitting: General Surgery

## 2017-05-30 DIAGNOSIS — R928 Other abnormal and inconclusive findings on diagnostic imaging of breast: Secondary | ICD-10-CM

## 2017-06-02 ENCOUNTER — Ambulatory Visit
Admission: RE | Admit: 2017-06-02 | Discharge: 2017-06-02 | Disposition: A | Discharge status: Home / Self Care | Payer: Medicare Other | Source: Ambulatory Visit | Attending: General Surgery | Admitting: General Surgery

## 2017-06-02 DIAGNOSIS — N6322 Unspecified lump in the left breast, upper inner quadrant: Secondary | ICD-10-CM | POA: Diagnosis not present

## 2017-06-02 DIAGNOSIS — R928 Other abnormal and inconclusive findings on diagnostic imaging of breast: Secondary | ICD-10-CM

## 2017-06-02 MED ORDER — GADOBENATE DIMEGLUMINE 529 MG/ML IV SOLN
11.0000 mL | Freq: Once | INTRAVENOUS | Status: AC | PRN
  Administered 2017-06-02: 11 mL via INTRAVENOUS

## 2017-06-17 DIAGNOSIS — E7800 Pure hypercholesterolemia, unspecified: Secondary | ICD-10-CM | POA: Diagnosis not present

## 2017-06-17 DIAGNOSIS — E1121 Type 2 diabetes mellitus with diabetic nephropathy: Secondary | ICD-10-CM | POA: Diagnosis not present

## 2017-06-17 DIAGNOSIS — Z6823 Body mass index (BMI) 23.0-23.9, adult: Secondary | ICD-10-CM | POA: Diagnosis not present

## 2017-06-17 DIAGNOSIS — F5101 Primary insomnia: Secondary | ICD-10-CM | POA: Diagnosis not present

## 2017-06-17 DIAGNOSIS — N183 Chronic kidney disease, stage 3 (moderate): Secondary | ICD-10-CM | POA: Diagnosis not present

## 2017-06-17 DIAGNOSIS — Z79899 Other long term (current) drug therapy: Secondary | ICD-10-CM | POA: Diagnosis not present

## 2017-06-17 DIAGNOSIS — I129 Hypertensive chronic kidney disease with stage 1 through stage 4 chronic kidney disease, or unspecified chronic kidney disease: Secondary | ICD-10-CM | POA: Diagnosis not present

## 2017-06-17 DIAGNOSIS — E11319 Type 2 diabetes mellitus with unspecified diabetic retinopathy without macular edema: Secondary | ICD-10-CM | POA: Diagnosis not present

## 2017-07-14 ENCOUNTER — Other Ambulatory Visit: Payer: Self-pay | Admitting: General Surgery

## 2017-07-14 DIAGNOSIS — Z9079 Acquired absence of other genital organ(s): Secondary | ICD-10-CM | POA: Diagnosis not present

## 2017-07-14 DIAGNOSIS — Z923 Personal history of irradiation: Secondary | ICD-10-CM | POA: Diagnosis not present

## 2017-07-14 DIAGNOSIS — N6320 Unspecified lump in the left breast, unspecified quadrant: Secondary | ICD-10-CM | POA: Diagnosis not present

## 2017-07-14 DIAGNOSIS — I639 Cerebral infarction, unspecified: Secondary | ICD-10-CM | POA: Diagnosis not present

## 2017-07-14 DIAGNOSIS — Z90722 Acquired absence of ovaries, bilateral: Secondary | ICD-10-CM | POA: Diagnosis not present

## 2017-07-14 DIAGNOSIS — Z853 Personal history of malignant neoplasm of breast: Secondary | ICD-10-CM | POA: Diagnosis not present

## 2017-07-14 DIAGNOSIS — I10 Essential (primary) hypertension: Secondary | ICD-10-CM | POA: Diagnosis not present

## 2017-07-14 DIAGNOSIS — E119 Type 2 diabetes mellitus without complications: Secondary | ICD-10-CM | POA: Diagnosis not present

## 2017-07-14 DIAGNOSIS — Z90710 Acquired absence of both cervix and uterus: Secondary | ICD-10-CM | POA: Diagnosis not present

## 2017-07-15 NOTE — Patient Instructions (Signed)
[name]  07/15/2017   Your procedure is scheduled on: 07/18/2017    Report to [facility] Main  Entrance Take [place]  elevators to 3rd floor to  [place] at     0800 AM.   Call this number if you have problems the morning of surgery [phone]    Remember: ONLY 1 PERSON MAY GO WITH YOU TO SHORT STAY TO GET  READY MORNING OF YOUR SURGERY.  Do not eat food or drink liquids :After Midnight.     Take these medicines the morning of surgery with A SIP OF WATER: Xanax if needed, Plendil, Synthroid, Prilosec  DO NOT TAKE ANY DIABETIC MEDICATIONS DAY OF YOUR SURGERY                               You may not have any metal on your body including hair pins and              piercings  Do not wear jewelry, make-up, lotions, powders or perfumes, deodorant             Do not wear nail polish.  Do not shave  48 hours prior to surgery.                 Do not bring valuables to the hospital. [place].  Contacts, dentures or bridgework may not be worn into surgery.       Patients discharged the day of surgery will not be allowed to drive home.  Name and phone number of your driver:                Please read over the following fact sheets you were given: _____________________________________________________________________             [facility] - Preparing for Surgery Before surgery, you can play an important role.  Because skin is not sterile, your skin needs to be as free of germs as possible.  You can reduce the number of germs on your skin by washing with CHG (chlorahexidine gluconate) soap before surgery.  CHG is an antiseptic cleaner which kills germs and bonds with the skin to continue killing germs even after washing. Please DO NOT use if you have an allergy to CHG or antibacterial soaps.  If your skin becomes reddened/irritated stop using the CHG and inform your nurse when you arrive at Short  Stay. Do not shave (including legs and underarms) for at least 48 hours prior to the first CHG shower.  You may shave your face/neck. Please follow these instructions carefully:  1.  Shower with CHG Soap the night before surgery and the  morning of Surgery.  2.  If you choose to wash your hair, wash your hair first as usual with your  normal  shampoo.  3.  After you shampoo, rinse your hair and body thoroughly to remove the  shampoo.                           4.  Use CHG as you would any other liquid soap.  You can apply chg directly  to the skin and wash  Gently with a scrungie or clean washcloth.  5.  Apply the CHG Soap to your body ONLY FROM THE NECK DOWN.   Do not use on face/ open                           Wound or open sores. Avoid contact with eyes, ears mouth and genitals (private parts).                       Wash face,  Genitals (private parts) with your normal soap.             6.  Wash thoroughly, paying special attention to the area where your surgery  will be performed.  7.  Thoroughly rinse your body with warm water from the neck down.  8.  DO NOT shower/wash with your normal soap after using and rinsing off  the CHG Soap.                9.  Pat yourself dry with a clean towel.            10.  Wear clean pajamas.            11.  Place clean sheets on your bed the night of your first shower and do not  sleep with pets. Day of Surgery : Do not apply any lotions/deodorants the morning of surgery.  Please wear clean clothes to the hospital/surgery center.  FAILURE TO FOLLOW THESE INSTRUCTIONS MAY RESULT IN THE CANCELLATION OF YOUR SURGERY PATIENT SIGNATURE_________________________________  NURSE SIGNATURE__________________________________  ________________________________________________________________________

## 2017-07-16 ENCOUNTER — Encounter (HOSPITAL_COMMUNITY): Payer: Self-pay

## 2017-07-16 ENCOUNTER — Encounter (HOSPITAL_COMMUNITY)
Admission: RE | Admit: 2017-07-16 | Discharge: 2017-07-16 | Disposition: A | Discharge status: Home / Self Care | Payer: Medicare Other | Source: Ambulatory Visit | Attending: General Surgery | Admitting: General Surgery

## 2017-07-16 DIAGNOSIS — N183 Chronic kidney disease, stage 3 (moderate): Secondary | ICD-10-CM | POA: Diagnosis not present

## 2017-07-16 DIAGNOSIS — Z8673 Personal history of transient ischemic attack (TIA), and cerebral infarction without residual deficits: Secondary | ICD-10-CM | POA: Diagnosis not present

## 2017-07-16 DIAGNOSIS — E039 Hypothyroidism, unspecified: Secondary | ICD-10-CM | POA: Diagnosis not present

## 2017-07-16 DIAGNOSIS — I129 Hypertensive chronic kidney disease with stage 1 through stage 4 chronic kidney disease, or unspecified chronic kidney disease: Secondary | ICD-10-CM | POA: Diagnosis not present

## 2017-07-16 DIAGNOSIS — E785 Hyperlipidemia, unspecified: Secondary | ICD-10-CM | POA: Diagnosis not present

## 2017-07-16 DIAGNOSIS — Z853 Personal history of malignant neoplasm of breast: Secondary | ICD-10-CM | POA: Diagnosis not present

## 2017-07-16 DIAGNOSIS — F329 Major depressive disorder, single episode, unspecified: Secondary | ICD-10-CM | POA: Diagnosis not present

## 2017-07-16 DIAGNOSIS — Z87891 Personal history of nicotine dependence: Secondary | ICD-10-CM | POA: Diagnosis not present

## 2017-07-16 DIAGNOSIS — C50412 Malignant neoplasm of upper-outer quadrant of left female breast: Secondary | ICD-10-CM | POA: Diagnosis present

## 2017-07-16 DIAGNOSIS — E119 Type 2 diabetes mellitus without complications: Secondary | ICD-10-CM | POA: Diagnosis not present

## 2017-07-16 DIAGNOSIS — Z79899 Other long term (current) drug therapy: Secondary | ICD-10-CM | POA: Diagnosis not present

## 2017-07-16 DIAGNOSIS — Z794 Long term (current) use of insulin: Secondary | ICD-10-CM | POA: Diagnosis not present

## 2017-07-16 DIAGNOSIS — N641 Fat necrosis of breast: Secondary | ICD-10-CM | POA: Diagnosis not present

## 2017-07-16 HISTORY — DX: Depression, unspecified: F32.A

## 2017-07-16 HISTORY — DX: Cardiac murmur, unspecified: R01.1

## 2017-07-16 HISTORY — DX: Family history of other specified conditions: Z84.89

## 2017-07-16 HISTORY — DX: Nausea with vomiting, unspecified: R11.2

## 2017-07-16 HISTORY — DX: Major depressive disorder, single episode, unspecified: F32.9

## 2017-07-16 HISTORY — DX: Nausea with vomiting, unspecified: Z98.890

## 2017-07-16 LAB — BASIC METABOLIC PANEL
ANION GAP: 7 (ref 5–15)
BUN: 31 mg/dL — ABNORMAL HIGH (ref 6–20)
CO2: 26 mmol/L (ref 22–32)
Calcium: 9.2 mg/dL (ref 8.9–10.3)
Chloride: 107 mmol/L (ref 101–111)
Creatinine, Ser: 1.04 mg/dL — ABNORMAL HIGH (ref 0.44–1.00)
GFR, EST AFRICAN AMERICAN: 57 mL/min — AB (ref >60)
GFR, EST NON AFRICAN AMERICAN: 49 mL/min — AB (ref >60)
Glucose, Bld: 169 mg/dL — ABNORMAL HIGH (ref 65–99)
POTASSIUM: 4.1 mmol/L (ref 3.5–5.1)
SODIUM: 140 mmol/L (ref 135–145)

## 2017-07-16 LAB — CBC
HEMATOCRIT: 34.2 % — AB (ref 36.0–46.0)
HEMOGLOBIN: 11.6 g/dL — AB (ref 12.0–15.0)
MCH: 33 pg (ref 26.0–34.0)
MCHC: 33.9 g/dL (ref 30.0–36.0)
MCV: 97.2 fL (ref 78.0–100.0)
Platelets: 261 10*3/uL (ref 150–400)
RBC: 3.52 MIL/uL — ABNORMAL LOW (ref 3.87–5.11)
RDW: 12.8 % (ref 11.5–15.5)
WBC: 7.3 10*3/uL (ref 4.0–10.5)

## 2017-07-16 LAB — GLUCOSE, CAPILLARY: Glucose-Capillary: 212 mg/dL — ABNORMAL HIGH (ref 65–99)

## 2017-07-16 NOTE — Progress Notes (Signed)
BMP done 07/16/17 routed to Dr. Dalbert Batman via epic

## 2017-07-16 NOTE — Progress Notes (Signed)
EKG 11/21/16 epic [id number] 06/17/17 chart Echo 10/07/16 epic ov notes Dr. Felipa Eth 06/17/17

## 2017-07-17 NOTE — H&P (Signed)
[name] Location: [facility] Patient #: [id number] DOB: 1934-06-23 Married / Language: English / Race: White Female        History of Present Illness       This is an 83 year old female who returns with her family members to discuss whether or not perform biopsy of her palpable mass in the left breast. Dr. Felipa Eth is her PCP.       Significant history of left breast lumpectomy for breast cancer in [facility] 2010. Sounds like she had brachytherapy with multiple stylets. Tried a hormone pill but did not tolerate it. Records are still pending. She states that she  felt a lump and in her left breast at the lumpectomy site over the past several months but this was not there a year ago. Maybe enlarging. No pain. Dr. Evangeline Dakin in radiology  feels that this is benign fat necrosis. She had a needle core biopsy of the most anterior aspect of this which shows fatty breast tissue without atypia. She had no complications.      Comorbidities significant for left breast cancer 2010 in [place]. Brachytherapy and attempted antiestrogen therapy. Hyperlipidemia. Diabetes mellitus without insulin. Hypertension. 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 CK D3. Proteinuria. Depression. History TAH and BSO.       Family history negative for breast ovarian or prostate cancer.        Socially she is married but her husband has severe dementia and is in a nursing facility. Family member with her today throughout the encounter. I operated on her sister 50 years ago for bilateral inguinal hernias. Denies alcohol or tobacco. Has lived in [place] for 3 years.        We had a long talk. She is worried about anesthesia but more worried about the uncertainty of her diagnosis. But we decided that we would try to do this under local anesthesia with IV sedation.       She'll be scheduled for left breast lumpectomy. She does not need localization as it is quite  palpable. We will do this under IV sedation. Supplement with local field block. I discussed the indications, details, techniques, and numerous risk of the surgery with her and her family. She is aware of the risk of bleeding, infection, cosmetic deformity, reoperation of cancer, nerve damage with chronic pain or numbness, cardiac pulmonary or thromboembolic problems. She understands these issues. All of her questions are answered. She agrees with this plan.   Medication History  Melatonin (5MG  Tablet, Oral) Active. MetFORMIN HCl ER (500MG  Tablet ER 24HR, Oral) Active. Felodipine ER (5MG  Tablet ER 24HR, Oral) Active. Losartan Potassium (100MG  Tablet, Oral) Active. Levothyroxine Sodium (50MCG Tablet, Oral) Active. ALPRAZolam (0.25MG  Tablet, Oral) Active. Aspirin (Oral) Specific strength unknown - Active. Multiple Vitamins (Oral) Active. B12-Active (Oral) Specific strength unknown - Active. Vitamin C (Oral) Specific strength unknown - Active. vision 4 health Active. Pepto-Bismol (Oral) Specific strength unknown - Active. PriLOSEC (20MG  Capsule DR, Oral) Active. Medications Reconciled  Vitals  Weight: 120.25 lb Height: 59in Body Surface Area: 1.49 m Body Mass Index: 24.29 kg/m  Temp.: 97.24F  Pulse: 78 (Regular)  P.OX: 97% (Room air) BP: 138/68 (Sitting, Left Arm, Standard)    Physical Exam  General Mental Status-Alert. General Appearance-Not in acute distress. Build & Nutrition-Well nourished. Posture-Normal posture. Gait-Normal.  Head and Neck Head-normocephalic, atraumatic with no lesions or palpable masses. Trachea-midline. Thyroid Gland Characteristics - normal size and consistency and no palpable nodules.  Chest and Lung Exam  Chest and lung exam reveals -on auscultation, normal breath sounds, no adventitious sounds and normal vocal resonance.  Breast Note: Breasts are atrophic. Not large. There is a transverse scar in  the left breast upper outer quadrant. Immediately beneath this is about a 3.5 cm firm mobile mass. Slightly irregular. Minimally tender. No inflammatory changes of the skin. No other masses in either breast. No axillary adenopathy.   Cardiovascular Cardiovascular examination reveals -normal heart sounds, regular rate and rhythm with no murmurs and femoral artery auscultation bilaterally reveals normal pulses, no bruits, no thrills.  Abdomen Inspection Inspection of the abdomen reveals - No Hernias. Palpation/Percussion Palpation and Percussion of the abdomen reveal - Soft, Non Tender, No Rigidity (guarding), No hepatosplenomegaly and No Palpable abdominal masses. Note: Healed lower midline scar.   Neurologic Neurologic evaluation reveals -alert and oriented x 3 with no impairment of recent or remote memory, normal attention span and ability to concentrate, normal sensation and normal coordination.  Musculoskeletal Normal Exam - Bilateral-Upper Extremity Strength Normal and Lower Extremity Strength Normal.    Assessment & Plan  LEFT BREAST MASS (N63.20)    Your recent imaging studies has included mammogram and MRI and image guided biopsy of the left breast mass Fortunately this just shows fatty breast tissue without atypia The biopsy was taken from the most anterior portion of the palpable mass  Hopefully, you do not have the cancer and this is simply scar tissue However, the mass has developed over the past 6 months and is in the lumpectomy site from your previous breast cancer I cannot be sure that you do not have cancer deeper within the tissues I have offered to proceed with conservative excision of this mass and you and your family members have agreed You have expressed concern about general anesthesia and deterioration of memory problems  you'll be scheduled for left breast lumpectomy under local anesthesia plus sedation in the near future You should be able to go  home the same day We have discussed the indications, techniques, and risks of the surgery in detail.  HISTORY OF LEFT BREAST CANCER (Z85.3) CVA (CEREBRAL VASCULAR ACCIDENT) (I63.9) HISTORY OF TOTAL ABDOMINAL HYSTERECTOMY AND BILATERAL SALPINGO-OOPHORECTOMY (Z90.710) HISTORY OF BRACHYTHERAPY (Z92.3) TYPE 2 DIABETES MELLITUS TREATED WITHOUT INSULIN (E11.9) HYPERTENSION, ESSENTIAL (I10)    Analei Whinery M. Dalbert Batman, M.D., [facility] General and Minimally invasive Surgery Breast and Colorectal Surgery Office:   [phone] Pager:   [phone]

## 2017-07-18 ENCOUNTER — Encounter (HOSPITAL_COMMUNITY): Payer: Self-pay | Admitting: *Deleted

## 2017-07-18 ENCOUNTER — Ambulatory Visit (HOSPITAL_COMMUNITY): Payer: Medicare Other | Admitting: Anesthesiology

## 2017-07-18 ENCOUNTER — Encounter (HOSPITAL_COMMUNITY)
Admission: RE | Disposition: A | Discharge status: Home / Self Care | Payer: Self-pay | Source: Ambulatory Visit | Attending: General Surgery

## 2017-07-18 ENCOUNTER — Ambulatory Visit (HOSPITAL_COMMUNITY)
Admission: RE | Admit: 2017-07-18 | Discharge: 2017-07-18 | Disposition: A | Discharge status: Home / Self Care | Payer: Medicare Other | Source: Ambulatory Visit | Attending: General Surgery | Admitting: General Surgery

## 2017-07-18 DIAGNOSIS — Z8673 Personal history of transient ischemic attack (TIA), and cerebral infarction without residual deficits: Secondary | ICD-10-CM | POA: Insufficient documentation

## 2017-07-18 DIAGNOSIS — N6012 Diffuse cystic mastopathy of left breast: Secondary | ICD-10-CM | POA: Diagnosis not present

## 2017-07-18 DIAGNOSIS — F329 Major depressive disorder, single episode, unspecified: Secondary | ICD-10-CM | POA: Diagnosis not present

## 2017-07-18 DIAGNOSIS — N6032 Fibrosclerosis of left breast: Secondary | ICD-10-CM | POA: Diagnosis not present

## 2017-07-18 DIAGNOSIS — N183 Chronic kidney disease, stage 3 (moderate): Secondary | ICD-10-CM | POA: Insufficient documentation

## 2017-07-18 DIAGNOSIS — Z79899 Other long term (current) drug therapy: Secondary | ICD-10-CM | POA: Insufficient documentation

## 2017-07-18 DIAGNOSIS — Z794 Long term (current) use of insulin: Secondary | ICD-10-CM | POA: Insufficient documentation

## 2017-07-18 DIAGNOSIS — R921 Mammographic calcification found on diagnostic imaging of breast: Secondary | ICD-10-CM | POA: Diagnosis not present

## 2017-07-18 DIAGNOSIS — N641 Fat necrosis of breast: Secondary | ICD-10-CM | POA: Diagnosis not present

## 2017-07-18 DIAGNOSIS — Z87891 Personal history of nicotine dependence: Secondary | ICD-10-CM | POA: Insufficient documentation

## 2017-07-18 DIAGNOSIS — I129 Hypertensive chronic kidney disease with stage 1 through stage 4 chronic kidney disease, or unspecified chronic kidney disease: Secondary | ICD-10-CM | POA: Diagnosis not present

## 2017-07-18 DIAGNOSIS — E039 Hypothyroidism, unspecified: Secondary | ICD-10-CM | POA: Insufficient documentation

## 2017-07-18 DIAGNOSIS — E785 Hyperlipidemia, unspecified: Secondary | ICD-10-CM | POA: Insufficient documentation

## 2017-07-18 DIAGNOSIS — N182 Chronic kidney disease, stage 2 (mild): Secondary | ICD-10-CM | POA: Diagnosis not present

## 2017-07-18 DIAGNOSIS — E119 Type 2 diabetes mellitus without complications: Secondary | ICD-10-CM | POA: Insufficient documentation

## 2017-07-18 DIAGNOSIS — N6321 Unspecified lump in the left breast, upper outer quadrant: Secondary | ICD-10-CM | POA: Diagnosis not present

## 2017-07-18 DIAGNOSIS — E1122 Type 2 diabetes mellitus with diabetic chronic kidney disease: Secondary | ICD-10-CM | POA: Diagnosis not present

## 2017-07-18 DIAGNOSIS — Z853 Personal history of malignant neoplasm of breast: Secondary | ICD-10-CM

## 2017-07-18 HISTORY — PX: BREAST LUMPECTOMY: SHX2

## 2017-07-18 LAB — GLUCOSE, CAPILLARY
GLUCOSE-CAPILLARY: 124 mg/dL — AB (ref 65–99)
Glucose-Capillary: 128 mg/dL — ABNORMAL HIGH (ref 65–99)

## 2017-07-18 SURGERY — BREAST LUMPECTOMY
Anesthesia: Monitor Anesthesia Care | Laterality: Left

## 2017-07-18 MED ORDER — GABAPENTIN 300 MG PO CAPS
300.0000 mg | ORAL_CAPSULE | ORAL | Status: AC
  Administered 2017-07-18: 300 mg via ORAL
  Filled 2017-07-18: qty 1

## 2017-07-18 MED ORDER — SODIUM CHLORIDE 0.9 % IV SOLN: 250.0000 mL | INTRAVENOUS | Status: DC | PRN

## 2017-07-18 MED ORDER — FENTANYL CITRATE (PF) 100 MCG/2ML IJ SOLN: 25.0000 ug | INTRAMUSCULAR | Status: DC | PRN

## 2017-07-18 MED ORDER — HYDROCODONE-ACETAMINOPHEN 5-325 MG PO TABS: 1.0000 | ORAL_TABLET | Freq: Four times a day (QID) | ORAL | 0 refills | Status: DC | PRN

## 2017-07-18 MED ORDER — LIDOCAINE-EPINEPHRINE (PF) 1 %-1:200000 IJ SOLN
INTRAMUSCULAR | Status: AC
  Filled 2017-07-18: qty 30

## 2017-07-18 MED ORDER — CELECOXIB 100 MG PO CAPS
100.0000 mg | ORAL_CAPSULE | ORAL | Status: AC
  Administered 2017-07-18: 100 mg via ORAL
  Filled 2017-07-18: qty 1

## 2017-07-18 MED ORDER — LACTATED RINGERS IV SOLN
INTRAVENOUS | Status: DC
  Administered 2017-07-18: 08:00:00 via INTRAVENOUS

## 2017-07-18 MED ORDER — LIDOCAINE-EPINEPHRINE 1 %-1:100000 IJ SOLN
INTRAMUSCULAR | Status: DC | PRN
  Administered 2017-07-18: 19 mL

## 2017-07-18 MED ORDER — PROPOFOL 10 MG/ML IV BOLUS
INTRAVENOUS | Status: DC | PRN
  Administered 2017-07-18: 30 mg via INTRAVENOUS
  Administered 2017-07-18: 10 mg via INTRAVENOUS

## 2017-07-18 MED ORDER — MEPERIDINE HCL 50 MG/ML IJ SOLN: 6.2500 mg | INTRAMUSCULAR | Status: DC | PRN

## 2017-07-18 MED ORDER — SODIUM CHLORIDE 0.9% FLUSH: 3.0000 mL | INTRAVENOUS | Status: DC | PRN

## 2017-07-18 MED ORDER — PROPOFOL 500 MG/50ML IV EMUL
INTRAVENOUS | Status: DC | PRN
  Administered 2017-07-18: 25 ug/kg/min via INTRAVENOUS

## 2017-07-18 MED ORDER — BUPIVACAINE-EPINEPHRINE (PF) 0.5% -1:200000 IJ SOLN
INTRAMUSCULAR | Status: AC
Start: 2017-07-18 — End: 2017-07-18
  Filled 2017-07-18: qty 30

## 2017-07-18 MED ORDER — 0.9 % SODIUM CHLORIDE (POUR BTL) OPTIME
TOPICAL | Status: DC | PRN
  Administered 2017-07-18: 1000 mL

## 2017-07-18 MED ORDER — OXYCODONE HCL 5 MG PO TABS: 5.0000 mg | ORAL_TABLET | ORAL | Status: DC | PRN

## 2017-07-18 MED ORDER — FENTANYL CITRATE (PF) 100 MCG/2ML IJ SOLN
INTRAMUSCULAR | Status: AC
  Filled 2017-07-18: qty 2

## 2017-07-18 MED ORDER — VANCOMYCIN HCL IN DEXTROSE 1-5 GM/200ML-% IV SOLN
1000.0000 mg | INTRAVENOUS | Status: AC
  Administered 2017-07-18: 1000 mg via INTRAVENOUS
  Filled 2017-07-18: qty 200

## 2017-07-18 MED ORDER — CHLORHEXIDINE GLUCONATE CLOTH 2 % EX PADS: 6.0000 | MEDICATED_PAD | Freq: Once | CUTANEOUS | Status: DC

## 2017-07-18 MED ORDER — ACETAMINOPHEN 650 MG RE SUPP
650.0000 mg | RECTAL | Status: DC | PRN
  Filled 2017-07-18: qty 1

## 2017-07-18 MED ORDER — SODIUM BICARBONATE 4 % IV SOLN
INTRAVENOUS | Status: AC
  Filled 2017-07-18: qty 5

## 2017-07-18 MED ORDER — PROPOFOL 10 MG/ML IV BOLUS
INTRAVENOUS | Status: AC
  Filled 2017-07-18: qty 20

## 2017-07-18 MED ORDER — FENTANYL CITRATE (PF) 100 MCG/2ML IJ SOLN
INTRAMUSCULAR | Status: DC | PRN
  Administered 2017-07-18 (×3): 25 ug via INTRAVENOUS

## 2017-07-18 MED ORDER — SODIUM BICARBONATE 4 % IV SOLN
INTRAVENOUS | Status: DC | PRN
  Administered 2017-07-18: 2 mL

## 2017-07-18 MED ORDER — LACTATED RINGERS IV SOLN: INTRAVENOUS | Status: DC

## 2017-07-18 MED ORDER — SODIUM CHLORIDE 0.9% FLUSH: 3.0000 mL | Freq: Two times a day (BID) | INTRAVENOUS | Status: DC

## 2017-07-18 MED ORDER — ACETAMINOPHEN 325 MG PO TABS: 650.0000 mg | ORAL_TABLET | ORAL | Status: DC | PRN

## 2017-07-18 MED ORDER — EPHEDRINE SULFATE 50 MG/ML IJ SOLN
INTRAMUSCULAR | Status: DC | PRN
  Administered 2017-07-18: 10 mg via INTRAVENOUS

## 2017-07-18 MED ORDER — ACETAMINOPHEN 500 MG PO TABS
500.0000 mg | ORAL_TABLET | ORAL | Status: AC
  Administered 2017-07-18: 500 mg via ORAL
  Filled 2017-07-18: qty 1

## 2017-07-18 SURGICAL SUPPLY — 30 items
APPLIER CLIP 11 MED OPEN (CLIP)
BENZOIN TINCTURE PRP APPL 2/3 (GAUZE/BANDAGES/DRESSINGS) ×3 IMPLANT
BINDER BREAST LRG (GAUZE/BANDAGES/DRESSINGS) ×3 IMPLANT
BINDER BREAST MEDIUM (GAUZE/BANDAGES/DRESSINGS) ×3 IMPLANT
BLADE HEX COATED 2.75 (ELECTRODE) ×3 IMPLANT
CLIP APPLIE 11 MED OPEN (CLIP) IMPLANT
CLIP VESOCCLUDE SM WIDE 6/CT (CLIP) IMPLANT
CLOSURE WOUND 1/2 X4 (GAUZE/BANDAGES/DRESSINGS) ×1
COVER SURGICAL LIGHT HANDLE (MISCELLANEOUS) ×3 IMPLANT
DISSECTOR ROUND CHERRY 3/8 STR (MISCELLANEOUS) IMPLANT
DRAIN CHANNEL 15F RND FF 3/16 (WOUND CARE) IMPLANT
DRAPE LAPAROSCOPIC ABDOMINAL (DRAPES) ×3 IMPLANT
DRAPE POUCH INSTRU U-SHP 10X18 (DRAPES) ×3 IMPLANT
ELECT REM PT RETURN 15FT ADLT (MISCELLANEOUS) ×3 IMPLANT
GAUZE SPONGE 4X4 12PLY STRL (GAUZE/BANDAGES/DRESSINGS) ×3 IMPLANT
GLOVE BIOGEL PI IND STRL 7.0 (GLOVE) ×1 IMPLANT
GLOVE BIOGEL PI INDICATOR 7.0 (GLOVE) ×2
GLOVE EUDERMIC 7 POWDERFREE (GLOVE) ×3 IMPLANT
GOWN STRL REUS W/TWL LRG LVL3 (GOWN DISPOSABLE) ×3 IMPLANT
GOWN STRL REUS W/TWL XL LVL3 (GOWN DISPOSABLE) ×6 IMPLANT
MARKER SKIN DUAL TIP RULER LAB (MISCELLANEOUS) ×3 IMPLANT
NEEDLE HYPO 22GX1.5 SAFETY (NEEDLE) ×3 IMPLANT
PACK GENERAL/GYN (CUSTOM PROCEDURE TRAY) ×3 IMPLANT
STAPLER VISISTAT 35W (STAPLE) ×3 IMPLANT
STRIP CLOSURE SKIN 1/2X4 (GAUZE/BANDAGES/DRESSINGS) ×2 IMPLANT
SUT MNCRL AB 4-0 PS2 18 (SUTURE) ×3 IMPLANT
SUT SILK 2 0 SH (SUTURE) ×3 IMPLANT
SUT VIC AB 3-0 SH 18 (SUTURE) ×3 IMPLANT
SYR CONTROL 10ML LL (SYRINGE) ×3 IMPLANT
TOWEL OR 17X26 10 PK STRL BLUE (TOWEL DISPOSABLE) ×6 IMPLANT

## 2017-07-18 NOTE — Anesthesia Postprocedure Evaluation (Signed)
Anesthesia Post Note  Patient: [name]  Procedure(s) Performed: Procedure(s) (LRB): LEFT BREAST LUMPECTOMY (Left)     Patient location during evaluation: PACU Anesthesia Type: MAC Level of consciousness: awake and alert Pain management: pain level controlled Vital Signs Assessment: post-procedure vital signs reviewed and stable Respiratory status: spontaneous breathing, nonlabored ventilation, respiratory function stable and patient connected to nasal cannula oxygen Cardiovascular status: stable and blood pressure returned to baseline Anesthetic complications: no    Last Vitals:  Vitals:   07/18/17 1115 07/18/17 1127  BP: (!) 157/64 (!) 164/54  Pulse: 60 62  Resp: 12 14  Temp:  (!) 36.3 C  SpO2: 96% 98%    Last Pain:  Vitals:   07/18/17 1127  TempSrc:   PainSc: 0-No pain                 Kiwanna Spraker

## 2017-07-18 NOTE — Transfer of Care (Signed)
Immediate Anesthesia Transfer of Care Note  Patient: [name]  Procedure(s) Performed: Procedure(s): LEFT BREAST LUMPECTOMY (Left)  Patient Location: PACU  Anesthesia Type:MAC  Level of Consciousness: Patient easily awoken, sedated, comfortable, cooperative, following commands, responds to stimulation.   Airway & Oxygen Therapy: Patient spontaneously breathing, ventilating well, oxygen via simple oxygen mask.  Post-op Assessment: Report given to PACU RN, vital signs reviewed and stable, moving all extremities.   Post vital signs: Reviewed and stable.  Complications: No apparent anesthesia complications  Last Vitals:  Vitals:   07/18/17 0755  BP: (!) 155/67  Pulse: (!) 59  Resp: 18  Temp: 36.6 C  SpO2: 98%    Last Pain:  Vitals:   07/18/17 0755  TempSrc: Oral      Patients Stated Pain Goal: 4 (17/00/17 4944)  Complications: No apparent anesthesia complications

## 2017-07-18 NOTE — Op Note (Signed)
Patient Name:           [name]   Date of Surgery:        07/18/2017  Pre op Diagnosis:      Palpable mass left breast, upper outer quadrant                                       Past history left breast lumpectomy for cancer with postop brachytherapy  Post op Diagnosis:    same  Procedure:                 Left breast lumpectomy with margin assessment  Surgeon:                     Edsel Petrin. Dalbert Batman, M.D., FACS  Assistant:                      OR staff   Indication for Assistant: N/A  Operative Indications:    this patient is brought to the operating room electively for left breast lumpectomy because of palpable mass in the left breast, upper outer quadrant, in an old lumpectomy site for cancer       Significant history of left breast lumpectomy for breast cancer in [facility] 2010. Sounds like she had brachytherapy with multiple stylets. Tried a hormone pill but did not tolerate it. She states that she  felt a lump and in her left breast at the lumpectomy site over the past several months but this was not there a year ago. Maybe enlarging. No pain. Dr. Evangeline Dakin in radiology  feels that this is benign fat necrosis. She had a needle core biopsy of the most anterior aspect of this which shows fatty breast tissue without atypia. this seems discordant.She had no complications.      Comorbidities significant for left breast cancer 2010 in [place]. Brachytherapy and attempted antiestrogen therapy. Hyperlipidemia. Diabetes mellitus without insulin. Hypertension. 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 CK D3. Proteinuria. Depression. History TAH and BSO.       Family history negative for breast ovarian or prostate cancer. .        We had a long talk. She is worried  about the uncertainty of her diagnosis. We decided that we would try to do this under local anesthesia with IV sedation.       She'll be scheduled for left breast lumpectomy.  She agrees with  this plan.  Operative Findings:       There was a 3.5 cm hard palpable mass in the upper outer quadrant of the left breast immediately beneath the old lumpectomy scar.  This looked more like fibrosis or fat necrosis and less like cancer.  It was marked with silk sutures to orient the pathologist and sent to the lab for routine histology.  Procedure in Detail:          The patient was brought to the operating room.  She was monitored and sedated by the anesthesia department.  The left breast was prepped and draped in a sterile fashion.  Intravenous antibiotics were given.  Surgical timeout was performed.  1% Xylocaine with epinephrine was used as a local infiltration anesthetic and field block.     Transverse incision was made in the upper outer quadrant of the left breast reveals scar.  The palpable mass was dissected away from  the surrounding tissues and conservatively excise.  Silk sutures were placed to orient the pathologist.  The specimen was sent to the lab.  Hemostasis was excellent.  The wound was irrigated with saline.  The breast tissues were later closed in 2 layers with interrupted sutures of 3-0 Vicryl and the skin closed with a running subcutaneous taken before 0 Monocryl and Steri-Strips.  Breast binder was placed and the patient taken to PACU in stable condition.  EBL 10 mL.  Counts correct.  Complications none.     Edsel Petrin. Dalbert Batman, M.D., FACS General and Minimally Invasive Surgery Breast and Colorectal Surgery    Addendum: I logged onto the [facility] website and reviewed her prescription medication history.  07/18/2017 10:31 AM

## 2017-07-18 NOTE — Anesthesia Preprocedure Evaluation (Addendum)
Anesthesia Evaluation  Patient identified by MRN, date of birth, ID band Patient awake    Reviewed: Allergy & Precautions, H&P , NPO status , Patient's Chart, lab work & pertinent test results  History of Anesthesia Complications (+) PONV  Airway Mallampati: I  TM Distance: >3 FB Neck ROM: Full    Dental no notable dental hx. (+) Teeth Intact, Dental Advisory Given   Pulmonary former smoker,    Pulmonary exam normal breath sounds clear to auscultation       Cardiovascular hypertension, On Medications  Rhythm:Regular Rate:Normal     Neuro/Psych PSYCHIATRIC DISORDERS Anxiety Depression CVA negative psych ROS   GI/Hepatic Neg liver ROS, GERD  Medicated and Controlled,  Endo/Other  diabetes, Type 2, Oral Hypoglycemic AgentsHypothyroidism   Renal/GU negative Renal ROS  negative genitourinary   Musculoskeletal   Abdominal   Peds  Hematology negative hematology ROS (+)   Anesthesia Other Findings Echo 11/17 - Left ventricle: The cavity size was normal. Wall thickness was   normal. Systolic function was normal. The estimated ejection   fraction was in the range of 55% to 60%. Wall motion was normal;   there were no regional wall motion abnormalities. Doppler   parameters are consistent with abnormal left ventricular   relaxation (grade 1 diastolic dysfunction). - Aortic valve: Trileaflet; mildly thickened, moderately calcified   leaflets.  Reproductive/Obstetrics negative OB ROS                            Anesthesia Physical  Anesthesia Plan  ASA: III  Anesthesia Plan: MAC   Post-op Pain Management:    Induction:   PONV Risk Score and Plan: 2 and Ondansetron, Dexamethasone, Treatment may vary due to age or medical condition and Midazolam  Airway Management Planned: Nasal Cannula, Natural Airway and Mask  Additional Equipment:   Intra-op Plan:   Post-operative Plan:   Informed  Consent: I have reviewed the patients History and Physical, chart, labs and discussed the procedure including the risks, benefits and alternatives for the proposed anesthesia with the patient or authorized representative who has indicated his/her understanding and acceptance.   Dental advisory given  Plan Discussed with: CRNA, Anesthesiologist and Surgeon  Anesthesia Plan Comments: ( )       Anesthesia Quick Evaluation

## 2017-07-18 NOTE — Discharge Instructions (Signed)
Monitored Anesthesia Care, Care After These instructions provide you with information about caring for yourself after your procedure. Your health care provider may also give you more specific instructions. Your treatment has been planned according to current medical practices, but problems sometimes occur. Call your health care provider if you have any problems or questions after your procedure. What can I expect after the procedure? After your procedure, it is common to:  Feel sleepy for several hours.  Feel clumsy and have poor balance for several hours.  Feel forgetful about what happened after the procedure.  Have poor judgment for several hours.  Feel nauseous or vomit.  Have a sore throat if you had a breathing tube during the procedure.  Follow these instructions at home: For at least 24 hours after the procedure:   Do not: ? Participate in activities in which you could fall or become injured. ? Drive. ? Use heavy machinery. ? Drink alcohol. ? Take sleeping pills or medicines that cause drowsiness. ? Make important decisions or sign legal documents. ? Take care of children on your own.  Rest. Eating and drinking  Follow the diet that is recommended by your health care provider.  If you vomit, drink water, juice, or soup when you can drink without vomiting.  Make sure you have little or no nausea before eating solid foods. General instructions  Have a responsible adult stay with you until you are awake and alert.  Take over-the-counter and prescription medicines only as told by your health care provider.  If you smoke, do not smoke without supervision.  Keep all follow-up visits as told by your health care provider. This is important. Contact a health care provider if:  You keep feeling nauseous or you keep vomiting.  You feel light-headed.  You develop a rash.  You have a fever. Get help right away if:  You have trouble breathing. This information is  not intended to replace advice given to you by your health care provider. Make sure you discuss any questions you have with your health care provider. Document Released: 03/03/2016 Document Revised: 07/03/2016 Document Reviewed: 03/03/2016 Elsevier Interactive Patient Education  2018 [place] BIOPSY/ PARTIAL MASTECTOMY: POST OP INSTRUCTIONS  Always review your discharge instruction sheet given to you by the facility where your surgery was performed.  IF YOU HAVE DISABILITY OR FAMILY LEAVE FORMS, YOU MUST BRING THEM TO THE OFFICE FOR PROCESSING.  DO NOT GIVE THEM TO YOUR DOCTOR.  1. A prescription for pain medication may be given to you upon discharge.  Take your pain medication as prescribed, if needed.  If narcotic pain medicine is not needed, then you may take acetaminophen (Tylenol) or ibuprofen (Advil) as needed. 2. Take your usually prescribed medications unless otherwise directed 3. If you need a refill on your pain medication, please contact your pharmacy.  They will contact our office to request authorization.  Prescriptions will not be filled after 5pm or on week-ends. 4. You should eat very light the first 24 hours after surgery, such as soup, crackers, pudding, etc.  Resume your normal diet the day after surgery. 5. Most patients will experience some swelling and bruising in the breast.  Ice packs and a good support bra will help.  Swelling and bruising can take several days to resolve.  6. It is common to experience some constipation if taking pain medication after surgery.  Increasing fluid intake and taking a stool softener will usually help or prevent this problem  from occurring.  A mild laxative (Milk of Magnesia or Miralax) should be taken according to package directions if there are no bowel movements after 48 hours. 7. Unless discharge instructions indicate otherwise, you may remove your bandages 24-48 hours after surgery, and you may shower at that time.  You may  have steri-strips (small skin tapes) in place directly over the incision.  These strips should be left on the skin for 7-10 days.  If your surgeon used skin glue on the incision, you may shower in 24 hours.  The glue will flake off over the next 2-3 weeks.  Any sutures or staples will be removed at the office during your follow-up visit. 8. ACTIVITIES:  You may resume regular daily activities (gradually increasing) beginning the next day.  Wearing a good support bra or sports bra minimizes pain and swelling.  You may have sexual intercourse when it is comfortable. a. You may drive when you no longer are taking prescription pain medication, you can comfortably wear a seatbelt, and you can safely maneuver your car and apply brakes. b. RETURN TO WORK:  ______________________________________________________________________________________ 9. You should see your doctor in the office for a follow-up appointment approximately two weeks after your surgery.  Your doctors nurse will typically make your follow-up appointment when she calls you with your pathology report.  Expect your pathology report 2-3 business days after your surgery.  You may call to check if you do not hear from [place] after three days. 10. OTHER INSTRUCTIONS: _______________________________________________________________________________________________ _____________________________________________________________________________________________________________________________________ _____________________________________________________________________________________________________________________________________ _____________________________________________________________________________________________________________________________________  WHEN TO CALL YOUR DOCTOR: 1. Fever over 101.0 2. Nausea and/or vomiting. 3. Extreme swelling or bruising. 4. Continued bleeding from incision. 5. Increased pain, redness, or drainage from the  incision.  The clinic staff is available to answer your questions during regular business hours.  Please dont hesitate to call and ask to speak to one of the nurses for clinical concerns.  If you have a medical emergency, go to the nearest emergency room or call 911.  A surgeon from [facility] is always on call at the hospital.  For further questions, please visit [email or web address]

## 2017-07-18 NOTE — Interval H&P Note (Signed)
History and Physical Interval Note:  07/18/2017 9:38 AM  [name]  has presented today for surgery, with the diagnosis of LEFT BREAST MASS  The various methods of treatment have been discussed with the patient and family. After consideration of risks, benefits and other options for treatment, the patient has consented to  Procedure(s): LEFT BREAST LUMPECTOMY (Left) as a surgical intervention .  The patient's history has been reviewed, patient examined, no change in status, stable for surgery.  I have reviewed the patient's chart and labs.  Questions were answered to the patient's satisfaction.     [name]

## 2017-07-18 NOTE — Anesthesia Procedure Notes (Signed)
Date/Time: 07/18/2017 9:43 AM Performed by: Deliah Boston Pre-anesthesia Checklist: Patient identified, Emergency Drugs available, Suction available and Patient being monitored Patient Re-evaluated:Patient Re-evaluated prior to induction Oxygen Delivery Method: Non-rebreather mask

## 2017-07-23 NOTE — Progress Notes (Signed)
Inform patient of Pathology report,. Breast tissue is completely benign. Obviously great news. Will discuss at post op O.V.  hmi

## 2017-07-30 DIAGNOSIS — L821 Other seborrheic keratosis: Secondary | ICD-10-CM | POA: Diagnosis not present

## 2017-07-30 DIAGNOSIS — L218 Other seborrheic dermatitis: Secondary | ICD-10-CM | POA: Diagnosis not present

## 2017-07-30 DIAGNOSIS — L814 Other melanin hyperpigmentation: Secondary | ICD-10-CM | POA: Diagnosis not present

## 2017-07-30 DIAGNOSIS — B078 Other viral warts: Secondary | ICD-10-CM | POA: Diagnosis not present

## 2017-08-06 DIAGNOSIS — Z23 Encounter for immunization: Secondary | ICD-10-CM | POA: Diagnosis not present

## 2017-09-02 DIAGNOSIS — H5711 Ocular pain, right eye: Secondary | ICD-10-CM | POA: Diagnosis not present

## 2017-09-12 DIAGNOSIS — L821 Other seborrheic keratosis: Secondary | ICD-10-CM | POA: Diagnosis not present

## 2017-09-12 DIAGNOSIS — L218 Other seborrheic dermatitis: Secondary | ICD-10-CM | POA: Diagnosis not present

## 2017-09-12 DIAGNOSIS — L820 Inflamed seborrheic keratosis: Secondary | ICD-10-CM | POA: Diagnosis not present

## 2017-10-31 DIAGNOSIS — N390 Urinary tract infection, site not specified: Secondary | ICD-10-CM | POA: Diagnosis not present

## 2017-10-31 DIAGNOSIS — J069 Acute upper respiratory infection, unspecified: Secondary | ICD-10-CM | POA: Diagnosis not present

## 2017-11-07 DIAGNOSIS — I129 Hypertensive chronic kidney disease with stage 1 through stage 4 chronic kidney disease, or unspecified chronic kidney disease: Secondary | ICD-10-CM | POA: Diagnosis not present

## 2017-11-07 DIAGNOSIS — N183 Chronic kidney disease, stage 3 (moderate): Secondary | ICD-10-CM | POA: Diagnosis not present

## 2017-11-07 DIAGNOSIS — N3281 Overactive bladder: Secondary | ICD-10-CM | POA: Diagnosis not present

## 2017-11-07 DIAGNOSIS — J069 Acute upper respiratory infection, unspecified: Secondary | ICD-10-CM | POA: Diagnosis not present

## 2017-12-16 DIAGNOSIS — I129 Hypertensive chronic kidney disease with stage 1 through stage 4 chronic kidney disease, or unspecified chronic kidney disease: Secondary | ICD-10-CM | POA: Diagnosis not present

## 2017-12-16 DIAGNOSIS — E11319 Type 2 diabetes mellitus with unspecified diabetic retinopathy without macular edema: Secondary | ICD-10-CM | POA: Diagnosis not present

## 2017-12-16 DIAGNOSIS — E1121 Type 2 diabetes mellitus with diabetic nephropathy: Secondary | ICD-10-CM | POA: Diagnosis not present

## 2017-12-16 DIAGNOSIS — Z79899 Other long term (current) drug therapy: Secondary | ICD-10-CM | POA: Diagnosis not present

## 2017-12-16 DIAGNOSIS — N183 Chronic kidney disease, stage 3 (moderate): Secondary | ICD-10-CM | POA: Diagnosis not present

## 2018-01-02 DIAGNOSIS — Z90710 Acquired absence of both cervix and uterus: Secondary | ICD-10-CM | POA: Diagnosis not present

## 2018-01-02 DIAGNOSIS — I10 Essential (primary) hypertension: Secondary | ICD-10-CM | POA: Diagnosis not present

## 2018-01-02 DIAGNOSIS — Z90722 Acquired absence of ovaries, bilateral: Secondary | ICD-10-CM | POA: Diagnosis not present

## 2018-01-02 DIAGNOSIS — E119 Type 2 diabetes mellitus without complications: Secondary | ICD-10-CM | POA: Diagnosis not present

## 2018-01-02 DIAGNOSIS — Z853 Personal history of malignant neoplasm of breast: Secondary | ICD-10-CM | POA: Diagnosis not present

## 2018-01-02 DIAGNOSIS — I639 Cerebral infarction, unspecified: Secondary | ICD-10-CM | POA: Diagnosis not present

## 2018-01-02 DIAGNOSIS — N6320 Unspecified lump in the left breast, unspecified quadrant: Secondary | ICD-10-CM | POA: Diagnosis not present

## 2018-01-02 DIAGNOSIS — N183 Chronic kidney disease, stage 3 (moderate): Secondary | ICD-10-CM | POA: Diagnosis not present

## 2018-01-02 DIAGNOSIS — Z923 Personal history of irradiation: Secondary | ICD-10-CM | POA: Diagnosis not present

## 2018-01-02 DIAGNOSIS — Z9079 Acquired absence of other genital organ(s): Secondary | ICD-10-CM | POA: Diagnosis not present

## 2018-01-06 DIAGNOSIS — H00011 Hordeolum externum right upper eyelid: Secondary | ICD-10-CM | POA: Diagnosis not present

## 2018-01-16 ENCOUNTER — Other Ambulatory Visit: Payer: Self-pay | Admitting: Geriatric Medicine

## 2018-01-16 DIAGNOSIS — Z139 Encounter for screening, unspecified: Secondary | ICD-10-CM

## 2018-01-19 DIAGNOSIS — H00011 Hordeolum externum right upper eyelid: Secondary | ICD-10-CM | POA: Diagnosis not present

## 2018-02-10 ENCOUNTER — Other Ambulatory Visit: Payer: Self-pay | Admitting: Geriatric Medicine

## 2018-02-10 DIAGNOSIS — R269 Unspecified abnormalities of gait and mobility: Secondary | ICD-10-CM

## 2018-02-10 DIAGNOSIS — M8588 Other specified disorders of bone density and structure, other site: Secondary | ICD-10-CM | POA: Diagnosis not present

## 2018-02-10 DIAGNOSIS — R410 Disorientation, unspecified: Secondary | ICD-10-CM

## 2018-02-10 DIAGNOSIS — E11319 Type 2 diabetes mellitus with unspecified diabetic retinopathy without macular edema: Secondary | ICD-10-CM | POA: Diagnosis not present

## 2018-02-10 DIAGNOSIS — Z79899 Other long term (current) drug therapy: Secondary | ICD-10-CM | POA: Diagnosis not present

## 2018-02-10 DIAGNOSIS — R200 Anesthesia of skin: Secondary | ICD-10-CM

## 2018-02-10 DIAGNOSIS — E1121 Type 2 diabetes mellitus with diabetic nephropathy: Secondary | ICD-10-CM | POA: Diagnosis not present

## 2018-02-10 DIAGNOSIS — I129 Hypertensive chronic kidney disease with stage 1 through stage 4 chronic kidney disease, or unspecified chronic kidney disease: Secondary | ICD-10-CM | POA: Diagnosis not present

## 2018-02-10 DIAGNOSIS — E039 Hypothyroidism, unspecified: Secondary | ICD-10-CM | POA: Diagnosis not present

## 2018-02-10 DIAGNOSIS — N183 Chronic kidney disease, stage 3 (moderate): Secondary | ICD-10-CM | POA: Diagnosis not present

## 2018-02-14 ENCOUNTER — Ambulatory Visit
Admission: RE | Admit: 2018-02-14 | Discharge: 2018-02-14 | Disposition: A | Discharge status: Home / Self Care | Payer: Medicare Other | Source: Ambulatory Visit | Attending: Geriatric Medicine | Admitting: Geriatric Medicine

## 2018-02-14 DIAGNOSIS — R269 Unspecified abnormalities of gait and mobility: Secondary | ICD-10-CM

## 2018-02-14 DIAGNOSIS — R410 Disorientation, unspecified: Secondary | ICD-10-CM

## 2018-02-14 DIAGNOSIS — R413 Other amnesia: Secondary | ICD-10-CM | POA: Diagnosis not present

## 2018-02-14 DIAGNOSIS — R200 Anesthesia of skin: Secondary | ICD-10-CM

## 2018-02-14 MED ORDER — GADOBENATE DIMEGLUMINE 529 MG/ML IV SOLN
6.0000 mL | Freq: Once | INTRAVENOUS | Status: AC | PRN
  Administered 2018-02-14: 6 mL via INTRAVENOUS

## 2018-03-11 DIAGNOSIS — H35363 Drusen (degenerative) of macula, bilateral: Secondary | ICD-10-CM | POA: Diagnosis not present

## 2018-03-23 ENCOUNTER — Ambulatory Visit
Admission: RE | Admit: 2018-03-23 | Discharge: 2018-03-23 | Disposition: A | Discharge status: Home / Self Care | Payer: Medicare Other | Source: Ambulatory Visit | Attending: Geriatric Medicine | Admitting: Geriatric Medicine

## 2018-03-23 DIAGNOSIS — Z139 Encounter for screening, unspecified: Secondary | ICD-10-CM

## 2018-03-23 DIAGNOSIS — Z1231 Encounter for screening mammogram for malignant neoplasm of breast: Secondary | ICD-10-CM | POA: Diagnosis not present

## 2018-04-15 ENCOUNTER — Ambulatory Visit: Payer: Medicare Other | Admitting: Podiatry

## 2018-04-28 DIAGNOSIS — F321 Major depressive disorder, single episode, moderate: Secondary | ICD-10-CM | POA: Diagnosis not present

## 2018-04-28 DIAGNOSIS — E1121 Type 2 diabetes mellitus with diabetic nephropathy: Secondary | ICD-10-CM | POA: Diagnosis not present

## 2018-04-28 DIAGNOSIS — F419 Anxiety disorder, unspecified: Secondary | ICD-10-CM | POA: Diagnosis not present

## 2018-04-28 DIAGNOSIS — Z1389 Encounter for screening for other disorder: Secondary | ICD-10-CM | POA: Diagnosis not present

## 2018-04-28 DIAGNOSIS — Z0000 Encounter for general adult medical examination without abnormal findings: Secondary | ICD-10-CM | POA: Diagnosis not present

## 2018-04-28 DIAGNOSIS — I129 Hypertensive chronic kidney disease with stage 1 through stage 4 chronic kidney disease, or unspecified chronic kidney disease: Secondary | ICD-10-CM | POA: Diagnosis not present

## 2018-04-28 DIAGNOSIS — E11319 Type 2 diabetes mellitus with unspecified diabetic retinopathy without macular edema: Secondary | ICD-10-CM | POA: Diagnosis not present

## 2018-04-28 DIAGNOSIS — N183 Chronic kidney disease, stage 3 (moderate): Secondary | ICD-10-CM | POA: Diagnosis not present

## 2018-05-25 DIAGNOSIS — I129 Hypertensive chronic kidney disease with stage 1 through stage 4 chronic kidney disease, or unspecified chronic kidney disease: Secondary | ICD-10-CM | POA: Diagnosis not present

## 2018-05-25 DIAGNOSIS — E11319 Type 2 diabetes mellitus with unspecified diabetic retinopathy without macular edema: Secondary | ICD-10-CM | POA: Diagnosis not present

## 2018-05-25 DIAGNOSIS — E1121 Type 2 diabetes mellitus with diabetic nephropathy: Secondary | ICD-10-CM | POA: Diagnosis not present

## 2018-05-25 DIAGNOSIS — R51 Headache: Secondary | ICD-10-CM | POA: Diagnosis not present

## 2018-05-25 DIAGNOSIS — F0150 Vascular dementia without behavioral disturbance: Secondary | ICD-10-CM | POA: Diagnosis not present

## 2018-05-25 DIAGNOSIS — N183 Chronic kidney disease, stage 3 (moderate): Secondary | ICD-10-CM | POA: Diagnosis not present

## 2018-06-08 DIAGNOSIS — H35363 Drusen (degenerative) of macula, bilateral: Secondary | ICD-10-CM | POA: Diagnosis not present

## 2018-07-28 DIAGNOSIS — N390 Urinary tract infection, site not specified: Secondary | ICD-10-CM | POA: Diagnosis not present

## 2018-08-04 ENCOUNTER — Ambulatory Visit: Payer: Medicare Other | Admitting: Psychology

## 2018-08-04 DIAGNOSIS — R488 Other symbolic dysfunctions: Secondary | ICD-10-CM | POA: Diagnosis not present

## 2018-08-04 DIAGNOSIS — R296 Repeated falls: Secondary | ICD-10-CM | POA: Diagnosis not present

## 2018-08-04 DIAGNOSIS — M6281 Muscle weakness (generalized): Secondary | ICD-10-CM | POA: Diagnosis not present

## 2018-08-04 DIAGNOSIS — R41841 Cognitive communication deficit: Secondary | ICD-10-CM | POA: Diagnosis not present

## 2018-08-05 DIAGNOSIS — M6281 Muscle weakness (generalized): Secondary | ICD-10-CM | POA: Diagnosis not present

## 2018-08-05 DIAGNOSIS — R41841 Cognitive communication deficit: Secondary | ICD-10-CM | POA: Diagnosis not present

## 2018-08-05 DIAGNOSIS — H1045 Other chronic allergic conjunctivitis: Secondary | ICD-10-CM | POA: Diagnosis not present

## 2018-08-05 DIAGNOSIS — R488 Other symbolic dysfunctions: Secondary | ICD-10-CM | POA: Diagnosis not present

## 2018-08-05 DIAGNOSIS — R296 Repeated falls: Secondary | ICD-10-CM | POA: Diagnosis not present

## 2018-08-10 DIAGNOSIS — M6281 Muscle weakness (generalized): Secondary | ICD-10-CM | POA: Diagnosis not present

## 2018-08-10 DIAGNOSIS — R296 Repeated falls: Secondary | ICD-10-CM | POA: Diagnosis not present

## 2018-08-10 DIAGNOSIS — R488 Other symbolic dysfunctions: Secondary | ICD-10-CM | POA: Diagnosis not present

## 2018-08-10 DIAGNOSIS — R41841 Cognitive communication deficit: Secondary | ICD-10-CM | POA: Diagnosis not present

## 2018-08-12 DIAGNOSIS — R296 Repeated falls: Secondary | ICD-10-CM | POA: Diagnosis not present

## 2018-08-12 DIAGNOSIS — R488 Other symbolic dysfunctions: Secondary | ICD-10-CM | POA: Diagnosis not present

## 2018-08-12 DIAGNOSIS — R41841 Cognitive communication deficit: Secondary | ICD-10-CM | POA: Diagnosis not present

## 2018-08-12 DIAGNOSIS — M6281 Muscle weakness (generalized): Secondary | ICD-10-CM | POA: Diagnosis not present

## 2018-08-14 DIAGNOSIS — R488 Other symbolic dysfunctions: Secondary | ICD-10-CM | POA: Diagnosis not present

## 2018-08-14 DIAGNOSIS — R296 Repeated falls: Secondary | ICD-10-CM | POA: Diagnosis not present

## 2018-08-14 DIAGNOSIS — R41841 Cognitive communication deficit: Secondary | ICD-10-CM | POA: Diagnosis not present

## 2018-08-14 DIAGNOSIS — M6281 Muscle weakness (generalized): Secondary | ICD-10-CM | POA: Diagnosis not present

## 2018-08-17 DIAGNOSIS — R488 Other symbolic dysfunctions: Secondary | ICD-10-CM | POA: Diagnosis not present

## 2018-08-17 DIAGNOSIS — R296 Repeated falls: Secondary | ICD-10-CM | POA: Diagnosis not present

## 2018-08-17 DIAGNOSIS — R41841 Cognitive communication deficit: Secondary | ICD-10-CM | POA: Diagnosis not present

## 2018-08-17 DIAGNOSIS — M6281 Muscle weakness (generalized): Secondary | ICD-10-CM | POA: Diagnosis not present

## 2018-08-19 DIAGNOSIS — M6281 Muscle weakness (generalized): Secondary | ICD-10-CM | POA: Diagnosis not present

## 2018-08-19 DIAGNOSIS — R296 Repeated falls: Secondary | ICD-10-CM | POA: Diagnosis not present

## 2018-08-19 DIAGNOSIS — R41841 Cognitive communication deficit: Secondary | ICD-10-CM | POA: Diagnosis not present

## 2018-08-19 DIAGNOSIS — R488 Other symbolic dysfunctions: Secondary | ICD-10-CM | POA: Diagnosis not present

## 2018-08-20 DIAGNOSIS — R488 Other symbolic dysfunctions: Secondary | ICD-10-CM | POA: Diagnosis not present

## 2018-08-20 DIAGNOSIS — R41841 Cognitive communication deficit: Secondary | ICD-10-CM | POA: Diagnosis not present

## 2018-08-20 DIAGNOSIS — R296 Repeated falls: Secondary | ICD-10-CM | POA: Diagnosis not present

## 2018-08-20 DIAGNOSIS — M6281 Muscle weakness (generalized): Secondary | ICD-10-CM | POA: Diagnosis not present

## 2018-08-20 IMAGING — US US ABDOMEN COMPLETE
1 series · 14 of 25 positions shown · non-contrast
Comparison: No recent prior .

CLINICAL DATA: Nausea.

EXAM:
ABDOMEN ULTRASOUND COMPLETE

[Series 1: us abdomen complete · 0.28mm/px · 14 of 106 slices shown]
[im 1/106]
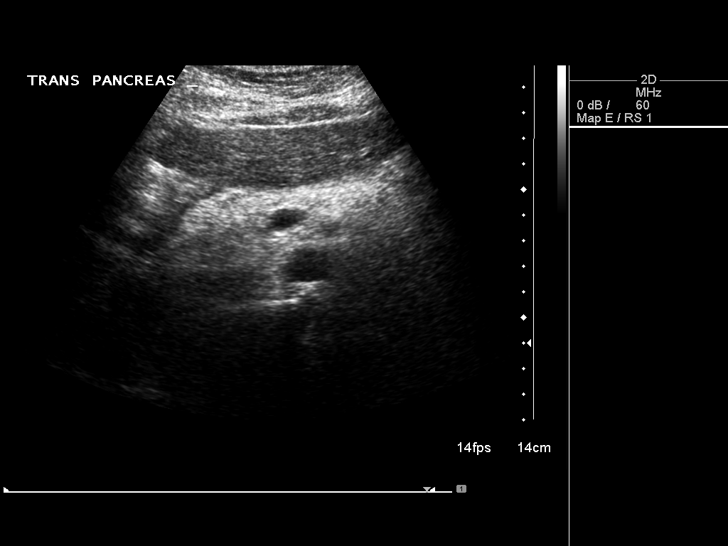
[im 9/106]
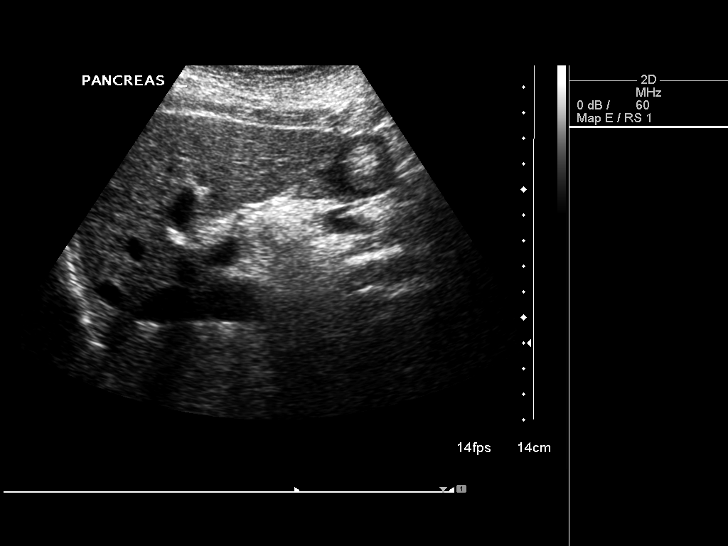
[im 18/106]
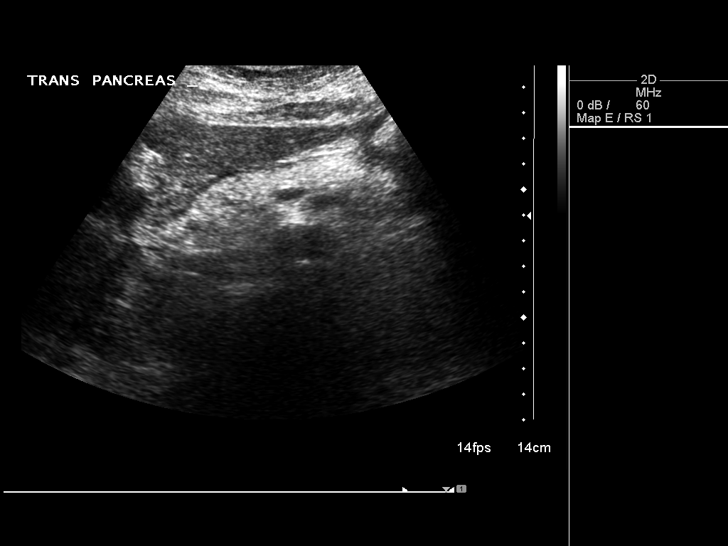
[im 27/106]
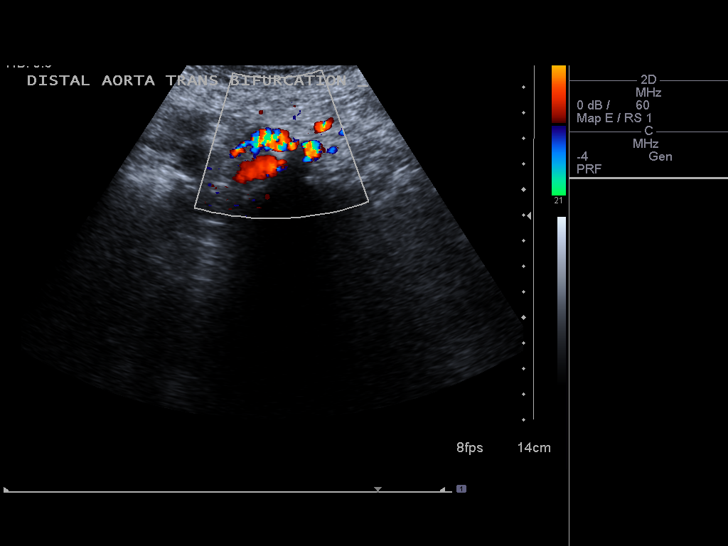
[im 36/106]
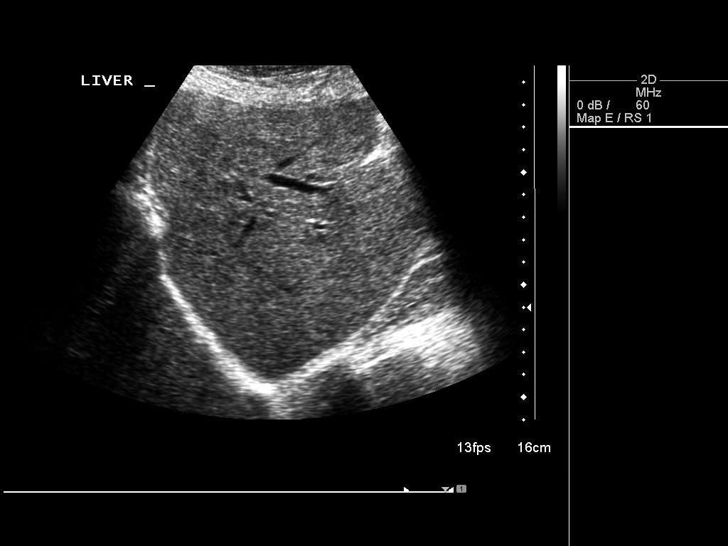
[im 40/106]
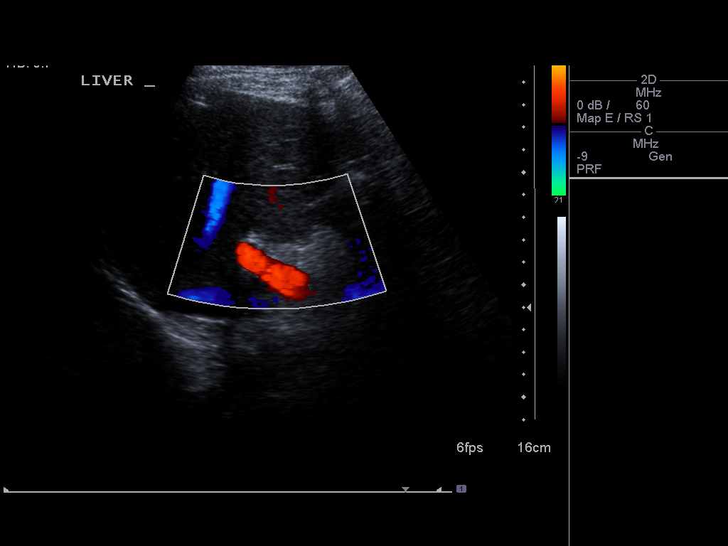
[im 49/106]
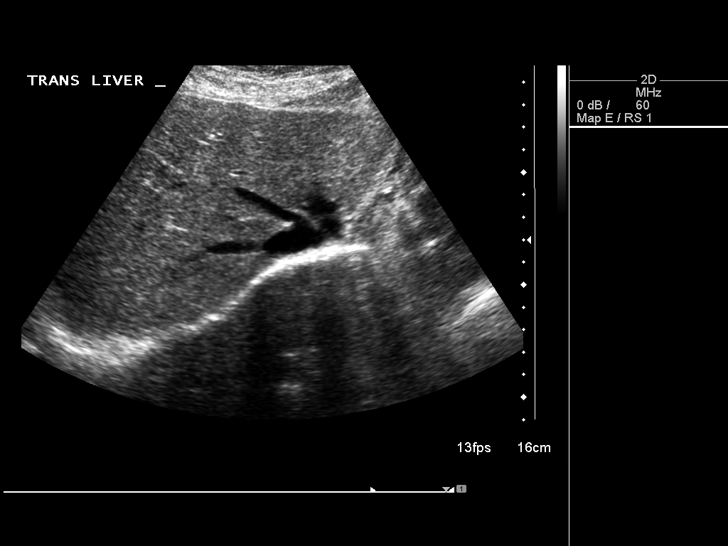
[im 57/106]
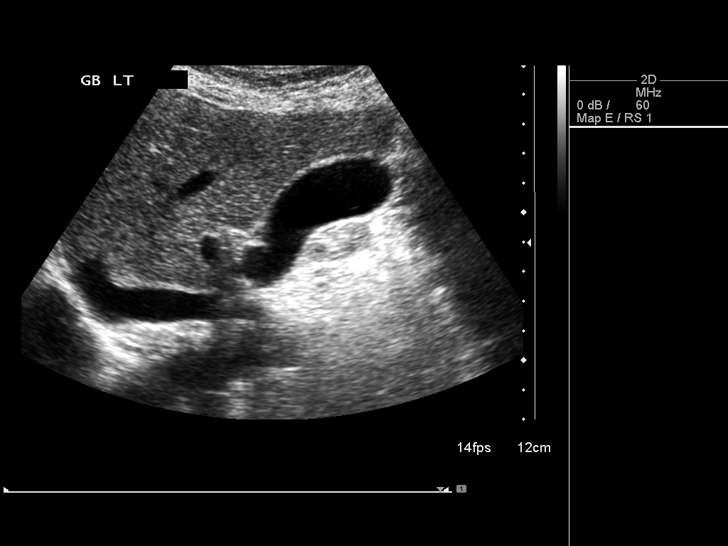
[im 66/106]
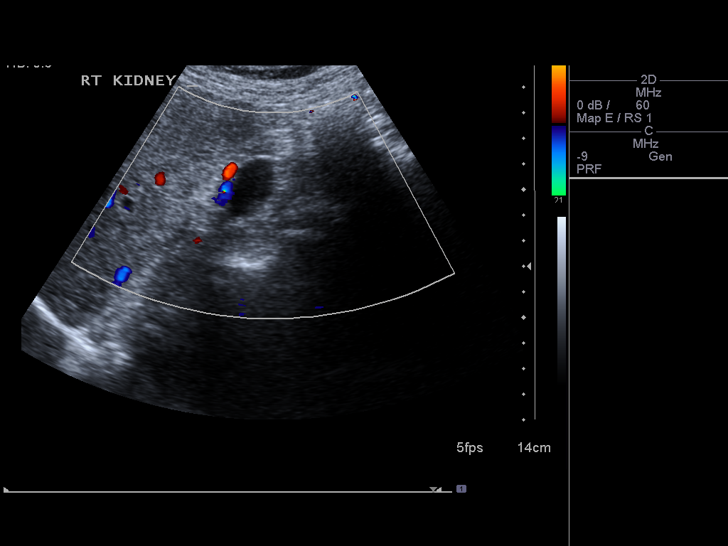
[im 71/106]
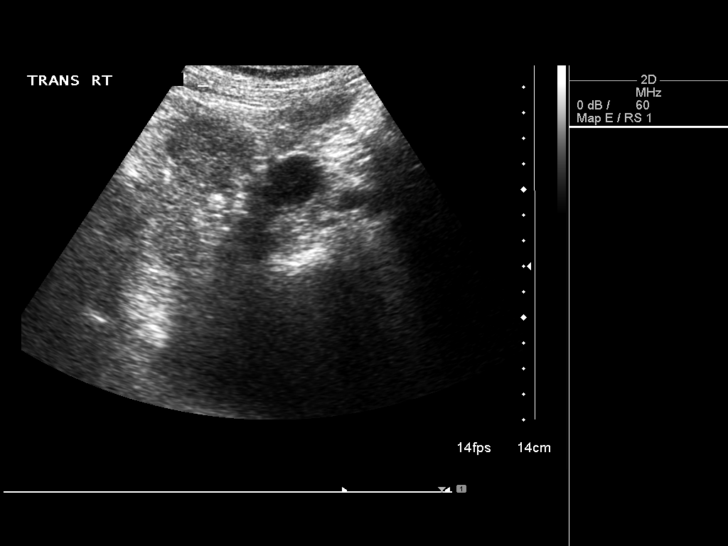
[im 79/106]
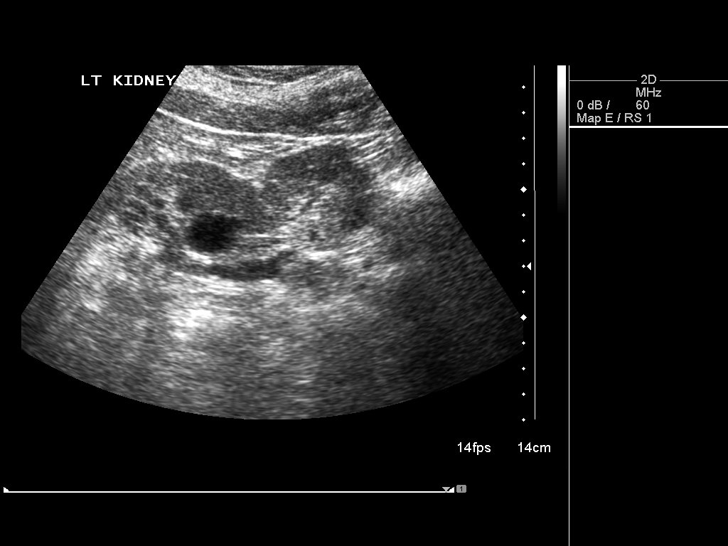
[im 88/106]
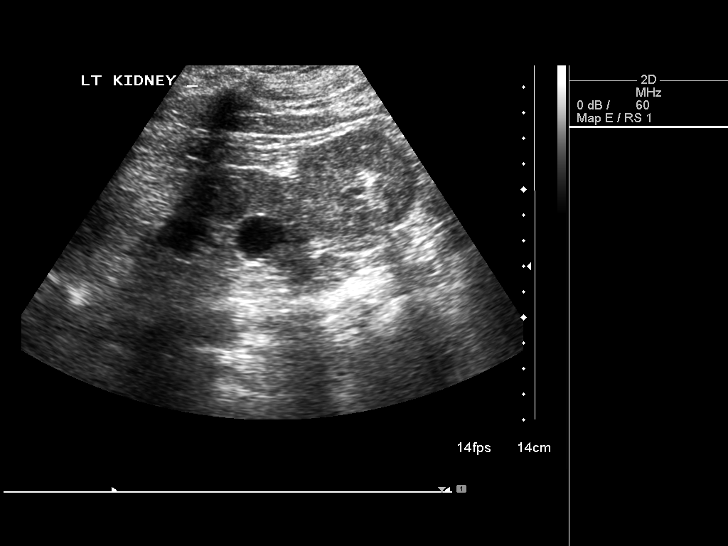
[im 97/106]
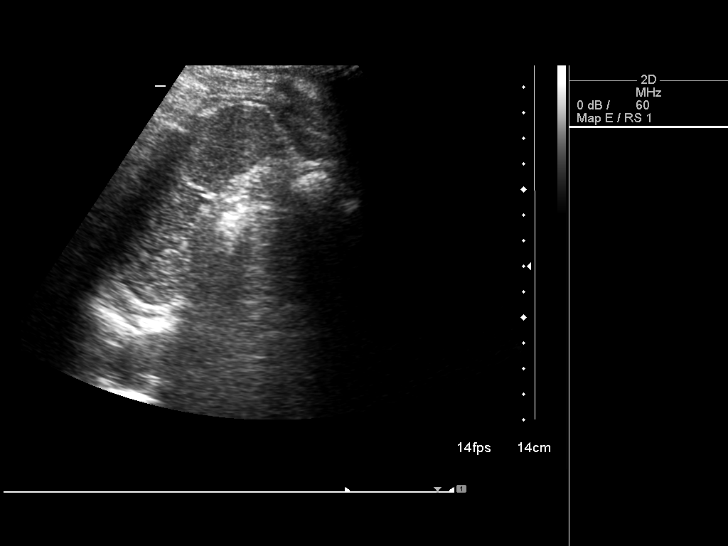
[im 106/106]
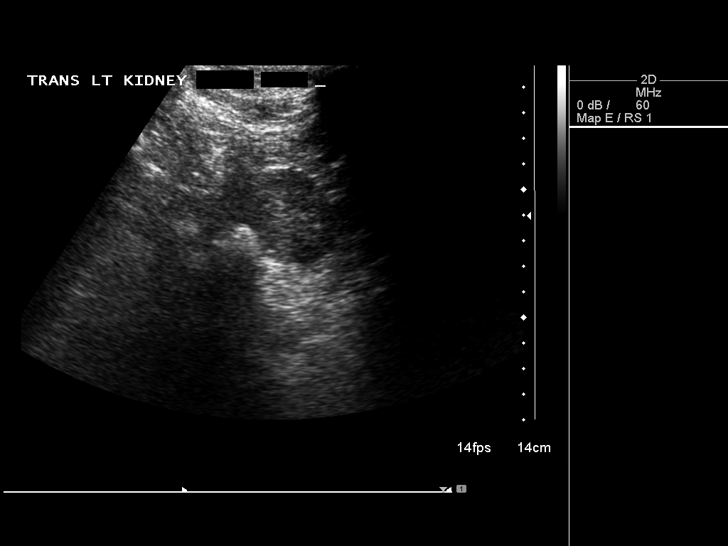

[14 of 25 positions shown; findings below may reference images not displayed]

FINDINGS: Gallbladder: No gallstones or wall thickening visualized. No
sonographic Murphy sign noted by sonographer.

Common bile duct: Diameter: 3.8 mm

Liver: No focal lesion identified. Within normal limits in
parenchymal echogenicity.

IVC: No abnormality visualized.

Pancreas: Pancreatic head is prominent.

Spleen: Size and appearance within normal limits.

Right Kidney: Length: 12.1 cm. Echogenicity within normal limits. No
hydronephrosis visualized. 10.9 x 12.1 x 2.5 cm simple cyst.

Left Kidney: Length: 11.0 cm. Echogenicity within normal limits. No
hydronephrosis visualized. 1.9 x 1.9 x 2.0 cm simple cyst. Focal
area of cortical scarring noted.

Abdominal aorta: No aneurysm visualized.

Other findings: None.
IMPRESSION: 1. Pancreatic head is slightly prominent. To exclude a pancreatic
head mass gadolinium -enhanced MRI of the abdomen can be obtained to
further evaluate.

2. No acute abnormality identified. Simple bilateral renal cysts are
present. Cortical scarring left kidney.

## 2018-08-24 DIAGNOSIS — M6281 Muscle weakness (generalized): Secondary | ICD-10-CM | POA: Diagnosis not present

## 2018-08-24 DIAGNOSIS — R296 Repeated falls: Secondary | ICD-10-CM | POA: Diagnosis not present

## 2018-08-24 DIAGNOSIS — R488 Other symbolic dysfunctions: Secondary | ICD-10-CM | POA: Diagnosis not present

## 2018-08-24 DIAGNOSIS — R41841 Cognitive communication deficit: Secondary | ICD-10-CM | POA: Diagnosis not present

## 2018-08-25 DIAGNOSIS — E1121 Type 2 diabetes mellitus with diabetic nephropathy: Secondary | ICD-10-CM | POA: Diagnosis not present

## 2018-08-25 DIAGNOSIS — N183 Chronic kidney disease, stage 3 (moderate): Secondary | ICD-10-CM | POA: Diagnosis not present

## 2018-08-25 DIAGNOSIS — E11319 Type 2 diabetes mellitus with unspecified diabetic retinopathy without macular edema: Secondary | ICD-10-CM | POA: Diagnosis not present

## 2018-08-25 DIAGNOSIS — E1142 Type 2 diabetes mellitus with diabetic polyneuropathy: Secondary | ICD-10-CM | POA: Diagnosis not present

## 2018-08-25 DIAGNOSIS — Z79899 Other long term (current) drug therapy: Secondary | ICD-10-CM | POA: Diagnosis not present

## 2018-08-25 DIAGNOSIS — Z23 Encounter for immunization: Secondary | ICD-10-CM | POA: Diagnosis not present

## 2018-08-25 DIAGNOSIS — M6281 Muscle weakness (generalized): Secondary | ICD-10-CM | POA: Diagnosis not present

## 2018-08-25 DIAGNOSIS — R41841 Cognitive communication deficit: Secondary | ICD-10-CM | POA: Diagnosis not present

## 2018-08-25 DIAGNOSIS — R296 Repeated falls: Secondary | ICD-10-CM | POA: Diagnosis not present

## 2018-08-25 DIAGNOSIS — I129 Hypertensive chronic kidney disease with stage 1 through stage 4 chronic kidney disease, or unspecified chronic kidney disease: Secondary | ICD-10-CM | POA: Diagnosis not present

## 2018-08-25 DIAGNOSIS — R488 Other symbolic dysfunctions: Secondary | ICD-10-CM | POA: Diagnosis not present

## 2018-08-26 DIAGNOSIS — R488 Other symbolic dysfunctions: Secondary | ICD-10-CM | POA: Diagnosis not present

## 2018-08-26 DIAGNOSIS — R296 Repeated falls: Secondary | ICD-10-CM | POA: Diagnosis not present

## 2018-08-26 DIAGNOSIS — M6281 Muscle weakness (generalized): Secondary | ICD-10-CM | POA: Diagnosis not present

## 2018-08-26 DIAGNOSIS — R41841 Cognitive communication deficit: Secondary | ICD-10-CM | POA: Diagnosis not present

## 2018-08-26 IMAGING — MR MR ABDOMEN WO/W CM
17 of 20 series · 39 of 48 positions shown · IV contrast (multihance)
Comparison: Ultrasound 09/11/2016

CLINICAL DATA: Intermittent nausea for several weeks. Evaluate
unusual size of head of pancreas.

EXAM:
MRI ABDOMEN WITHOUT AND WITH CONTRAST
TECHNIQUE: Multiplanar multisequence MR imaging of the abdomen was performed
both before and after the administration of intravenous contrast.
CONTRAST:  11mL MULTIHANCE GADOBENATE DIMEGLUMINE 529 MG/ML IV SOLN

[Series 3: T2 · coronal · 5.0mm · 1.41mm/px · 1 of 33 slices shown (1 of 4)]
[im 1/33]
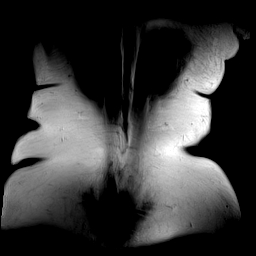

[Series 4: T1 · axial · 3.0mm · 1.12mm/px · z∈[-74,+103]mm · 4 of 120 slices shown]
[im 1/120]
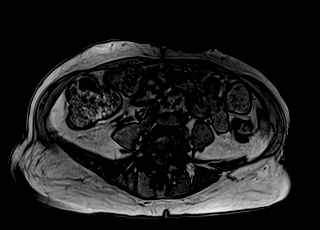
[im 40/120]
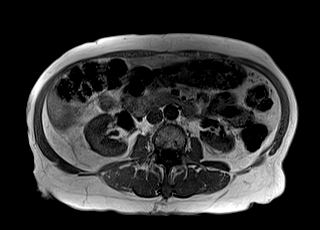
[im 80/120]
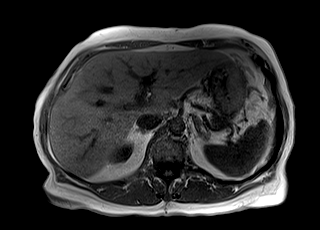
[im 120/120]
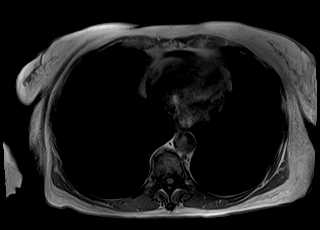

[Series 7: T2 · coronal · 3.0mm · 1.12mm/px · 1 of 19 slices shown (2 of 4)]
[im 1/19]
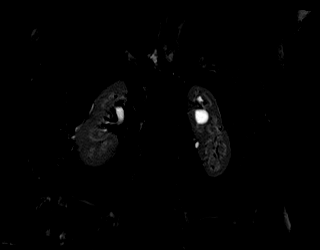

[Series 8: T2 · axial · 5.0mm · 1.41mm/px · 1 of 38 slices shown (3 of 4)]
[im 1/38]
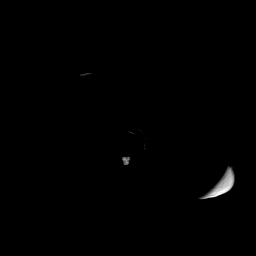

[Series 9: DWI · axial · 5.0mm · 1.34mm/px · z∈[-81,+123]mm · 4 of 114 slices shown (1 of 2)]
[im 1/114]
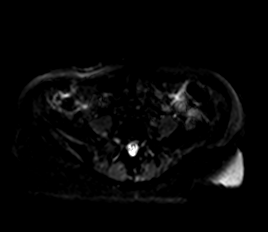
[im 38/114]
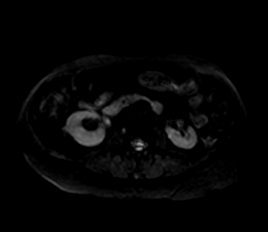
[im 76/114]
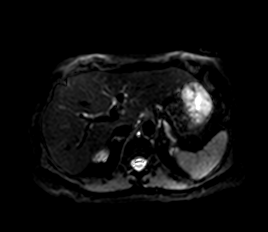
[im 114/114]
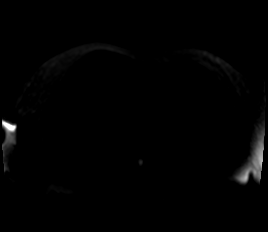

[Series 10: DWI · axial · 5.0mm · 1.34mm/px · 1 of 38 slices shown (2 of 2)]
[im 1/38]
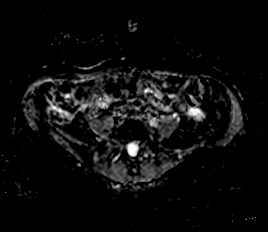

[Series 12: MRCP · coronal · 1.0mm · 0.44mm/px · 3 of 64 slices shown]
[im 1/64]
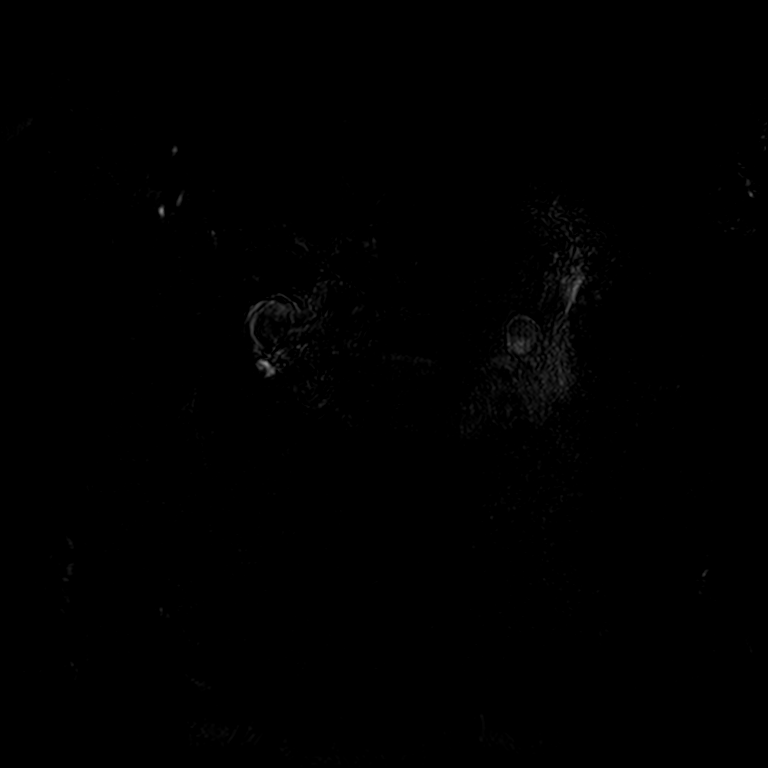
[im 32/64]
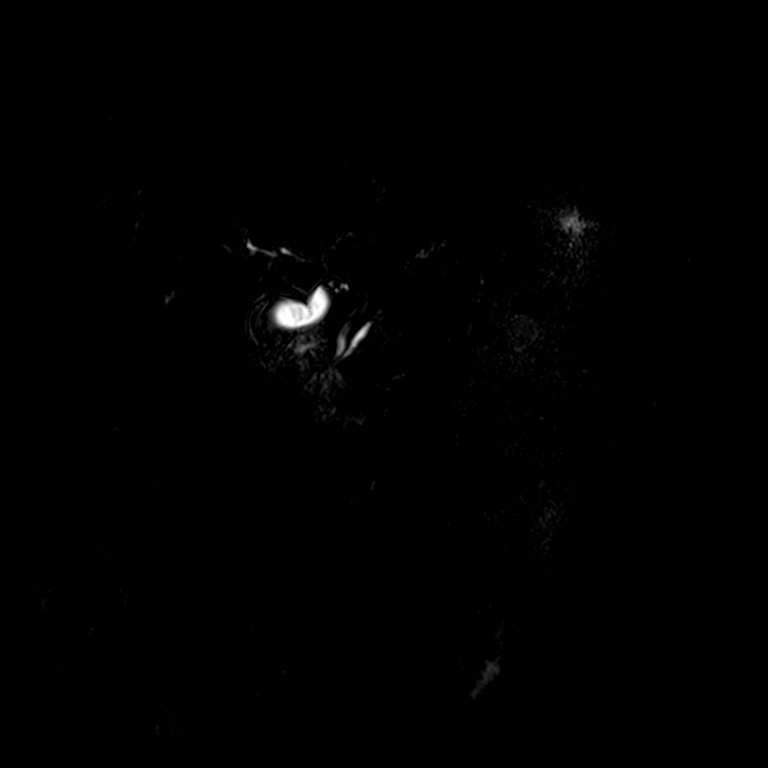
[im 64/64]
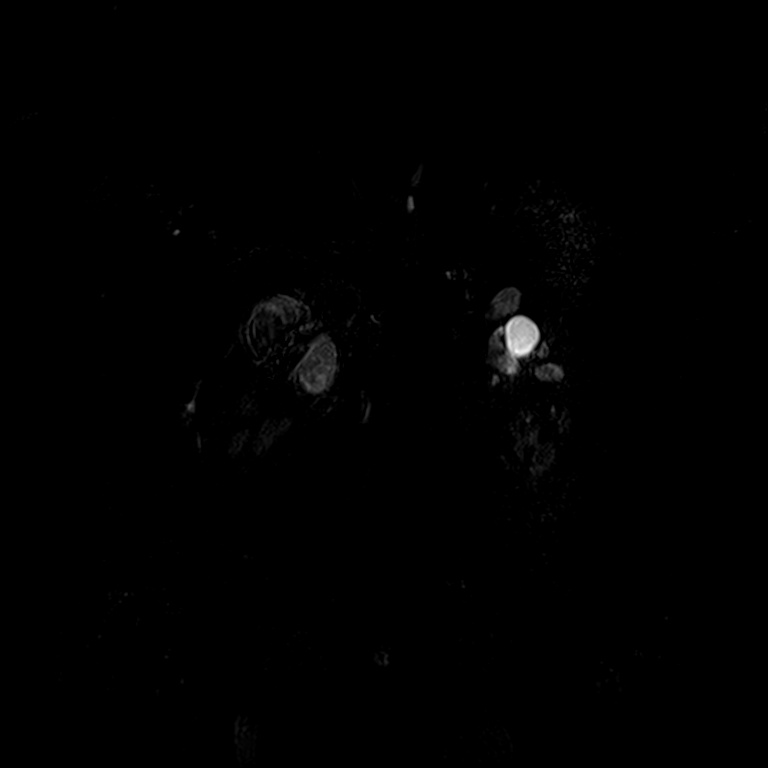

[Series 14: T2 · axial · 5.0mm · 1.12mm/px · 1 of 33 slices shown (4 of 4)]
[im 1/33]
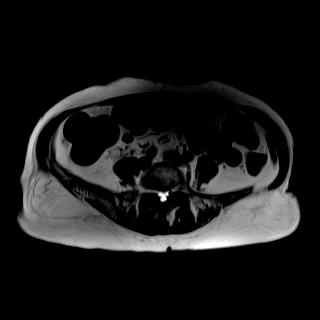

[Series 15: bSSFP · axial · 5.0mm · 1.18mm/px · 1 of 33 slices shown]
[im 1/33]
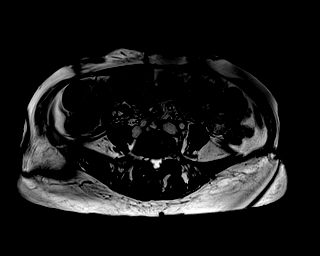

[Series 17: T1 dynamic · axial · non-contrast · 3.0mm · 1.12mm/px · z∈[-103,+110]mm · 3 of 72 slices shown]
[im 1/72]
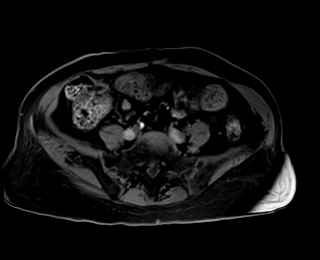
[im 36/72]
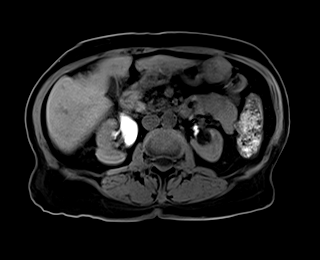
[im 72/72]
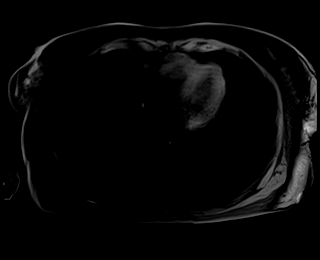

[Series 18: T1 dynamic post-contrast · axial · 3.0mm · 1.12mm/px · z∈[-103,+110]mm · 3 of 72 slices shown (1 of 7)]
[im 1/72]
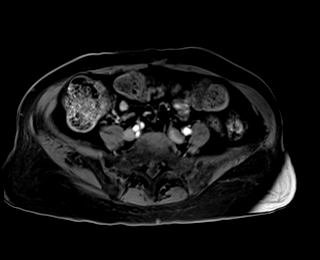
[im 36/72]
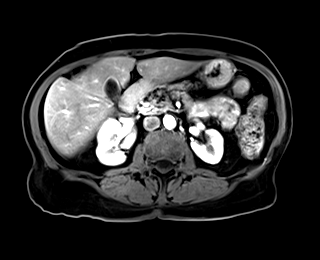
[im 72/72]
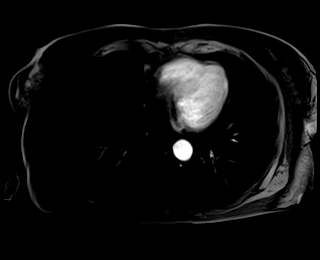

[Series 19: T1 dynamic post-contrast · axial · 3.0mm · 1.12mm/px · z∈[-103,+110]mm · 3 of 72 slices shown (2 of 7)]
[im 1/72]
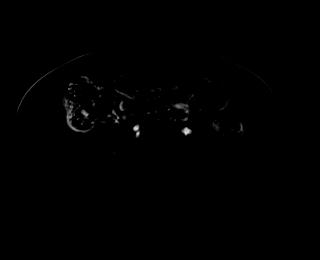
[im 36/72]
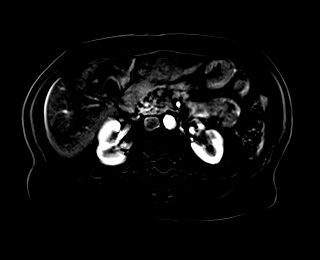
[im 72/72]
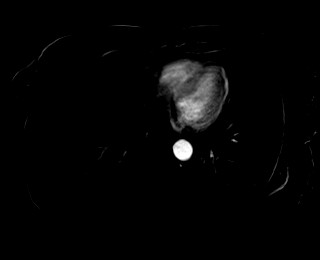

[Series 20: T1 dynamic post-contrast · axial · 3.0mm · 1.12mm/px · z∈[-103,+110]mm · 3 of 72 slices shown (3 of 7)]
[im 1/72]
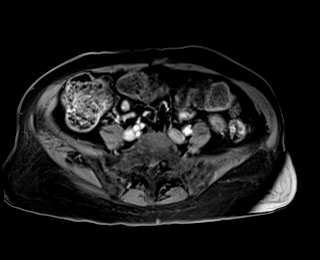
[im 36/72]
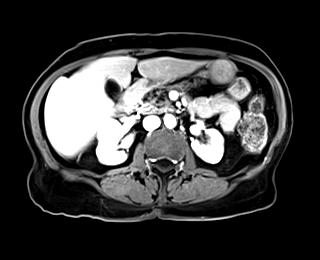
[im 72/72]
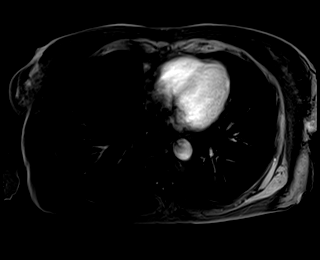

[Series 21: T1 dynamic post-contrast · axial · 3.0mm · 1.12mm/px · z∈[-103,+110]mm · 3 of 72 slices shown (4 of 7)]
[im 1/72]
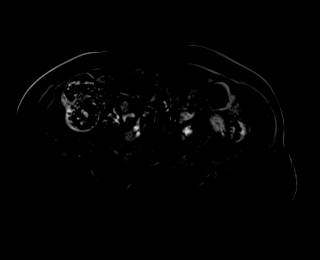
[im 36/72]
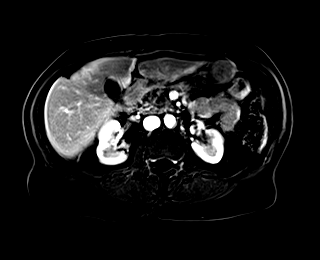
[im 72/72]
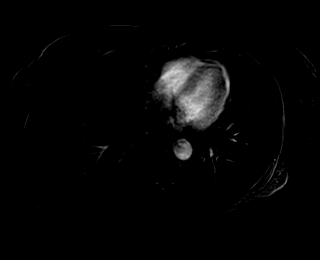

[Series 22: T1 dynamic post-contrast · axial · 3.0mm · 1.12mm/px · z∈[-103,+110]mm · 3 of 72 slices shown (5 of 7)]
[im 1/72]
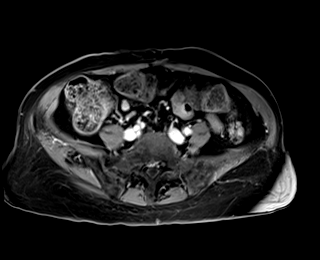
[im 36/72]
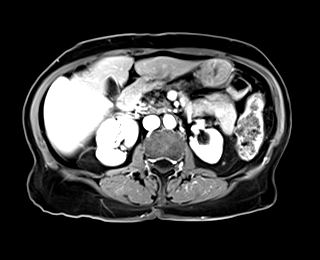
[im 72/72]
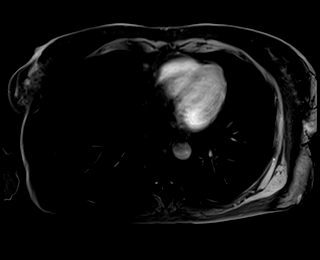

[Series 23: T1 dynamic post-contrast · axial · 3.0mm · 1.12mm/px · z∈[-103,+110]mm · 3 of 72 slices shown (6 of 7)]
[im 1/72]
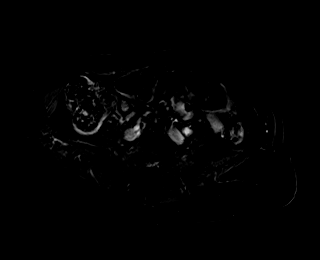
[im 36/72]
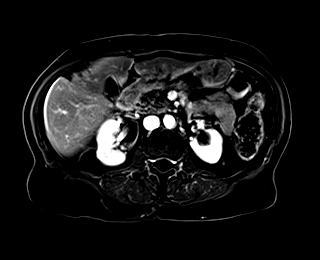
[im 72/72]
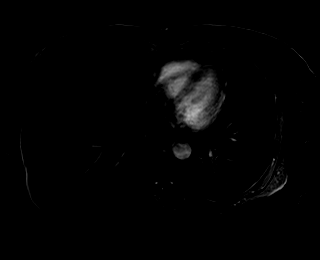

[Series 24: T1 dynamic post-contrast · coronal · 2.6mm · 1.12mm/px · 1 of 64 slices shown (7 of 7)]
[im 1/64]
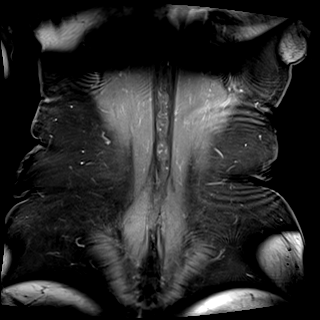

[39 of 48 positions shown; findings below may reference images not displayed]

FINDINGS: Lower chest: No pleural effusion identified.

Hepatobiliary: No mass or other parenchymal abnormality identified.
The gallbladder is normal. No gallstones or gallbladder wall
thickening. No biliary dilatation.

Pancreas: No mass, inflammatory changes, or other parenchymal
abnormality identified. No pancreatic duct dilatation.

Spleen:  Within normal limits in size and appearance.

Adrenals/Urinary Tract: Normal adrenal glands. Focal area of
cortical scarring involves the anterior cortex of the right kidney.
Simple appearing cyst is noted within the mid right kidney. There
are several areas of cortical scarring involving the left kidney.
There is No kidney mass or hydronephrosis identified.

Stomach/Bowel: Visualized portions within the abdomen are
unremarkable.

Vascular/Lymphatic: No pathologically enlarged lymph nodes
identified. No abdominal aortic aneurysm demonstrated.

Other:  None.

Musculoskeletal: No suspicious bone lesions identified. Mild lumbar
scoliosis deformity is convex towards the left.
IMPRESSION: 1. No evidence for mass involving head of pancreas.
2. No acute findings to explain patient's intermittent nausea for
several weeks. The gallbladder appears normal and there is no
biliary dilatation.

## 2018-08-27 DIAGNOSIS — R41841 Cognitive communication deficit: Secondary | ICD-10-CM | POA: Diagnosis not present

## 2018-08-27 DIAGNOSIS — R296 Repeated falls: Secondary | ICD-10-CM | POA: Diagnosis not present

## 2018-08-27 DIAGNOSIS — M6281 Muscle weakness (generalized): Secondary | ICD-10-CM | POA: Diagnosis not present

## 2018-08-27 DIAGNOSIS — R488 Other symbolic dysfunctions: Secondary | ICD-10-CM | POA: Diagnosis not present

## 2018-08-31 DIAGNOSIS — R296 Repeated falls: Secondary | ICD-10-CM | POA: Diagnosis not present

## 2018-08-31 DIAGNOSIS — R488 Other symbolic dysfunctions: Secondary | ICD-10-CM | POA: Diagnosis not present

## 2018-08-31 DIAGNOSIS — R41841 Cognitive communication deficit: Secondary | ICD-10-CM | POA: Diagnosis not present

## 2018-08-31 DIAGNOSIS — M6281 Muscle weakness (generalized): Secondary | ICD-10-CM | POA: Diagnosis not present

## 2018-09-01 DIAGNOSIS — M6281 Muscle weakness (generalized): Secondary | ICD-10-CM | POA: Diagnosis not present

## 2018-09-01 DIAGNOSIS — R41841 Cognitive communication deficit: Secondary | ICD-10-CM | POA: Diagnosis not present

## 2018-09-01 DIAGNOSIS — R296 Repeated falls: Secondary | ICD-10-CM | POA: Diagnosis not present

## 2018-09-01 DIAGNOSIS — R488 Other symbolic dysfunctions: Secondary | ICD-10-CM | POA: Diagnosis not present

## 2018-09-02 DIAGNOSIS — R488 Other symbolic dysfunctions: Secondary | ICD-10-CM | POA: Diagnosis not present

## 2018-09-02 DIAGNOSIS — R296 Repeated falls: Secondary | ICD-10-CM | POA: Diagnosis not present

## 2018-09-02 DIAGNOSIS — R41841 Cognitive communication deficit: Secondary | ICD-10-CM | POA: Diagnosis not present

## 2018-09-02 DIAGNOSIS — M6281 Muscle weakness (generalized): Secondary | ICD-10-CM | POA: Diagnosis not present

## 2018-09-03 DIAGNOSIS — R488 Other symbolic dysfunctions: Secondary | ICD-10-CM | POA: Diagnosis not present

## 2018-09-03 DIAGNOSIS — M6281 Muscle weakness (generalized): Secondary | ICD-10-CM | POA: Diagnosis not present

## 2018-09-03 DIAGNOSIS — R41841 Cognitive communication deficit: Secondary | ICD-10-CM | POA: Diagnosis not present

## 2018-09-03 DIAGNOSIS — R296 Repeated falls: Secondary | ICD-10-CM | POA: Diagnosis not present

## 2018-09-07 DIAGNOSIS — R296 Repeated falls: Secondary | ICD-10-CM | POA: Diagnosis not present

## 2018-09-07 DIAGNOSIS — R41841 Cognitive communication deficit: Secondary | ICD-10-CM | POA: Diagnosis not present

## 2018-09-07 DIAGNOSIS — R488 Other symbolic dysfunctions: Secondary | ICD-10-CM | POA: Diagnosis not present

## 2018-09-07 DIAGNOSIS — M6281 Muscle weakness (generalized): Secondary | ICD-10-CM | POA: Diagnosis not present

## 2018-09-08 DIAGNOSIS — R41841 Cognitive communication deficit: Secondary | ICD-10-CM | POA: Diagnosis not present

## 2018-09-08 DIAGNOSIS — M6281 Muscle weakness (generalized): Secondary | ICD-10-CM | POA: Diagnosis not present

## 2018-09-08 DIAGNOSIS — R488 Other symbolic dysfunctions: Secondary | ICD-10-CM | POA: Diagnosis not present

## 2018-09-08 DIAGNOSIS — R296 Repeated falls: Secondary | ICD-10-CM | POA: Diagnosis not present

## 2018-09-10 DIAGNOSIS — R41841 Cognitive communication deficit: Secondary | ICD-10-CM | POA: Diagnosis not present

## 2018-09-10 DIAGNOSIS — M6281 Muscle weakness (generalized): Secondary | ICD-10-CM | POA: Diagnosis not present

## 2018-09-10 DIAGNOSIS — R488 Other symbolic dysfunctions: Secondary | ICD-10-CM | POA: Diagnosis not present

## 2018-09-10 DIAGNOSIS — R296 Repeated falls: Secondary | ICD-10-CM | POA: Diagnosis not present

## 2018-09-14 DIAGNOSIS — R488 Other symbolic dysfunctions: Secondary | ICD-10-CM | POA: Diagnosis not present

## 2018-09-14 DIAGNOSIS — M6281 Muscle weakness (generalized): Secondary | ICD-10-CM | POA: Diagnosis not present

## 2018-09-14 DIAGNOSIS — R41841 Cognitive communication deficit: Secondary | ICD-10-CM | POA: Diagnosis not present

## 2018-09-14 DIAGNOSIS — R296 Repeated falls: Secondary | ICD-10-CM | POA: Diagnosis not present

## 2018-09-16 DIAGNOSIS — R296 Repeated falls: Secondary | ICD-10-CM | POA: Diagnosis not present

## 2018-09-16 DIAGNOSIS — M6281 Muscle weakness (generalized): Secondary | ICD-10-CM | POA: Diagnosis not present

## 2018-09-16 DIAGNOSIS — R488 Other symbolic dysfunctions: Secondary | ICD-10-CM | POA: Diagnosis not present

## 2018-09-16 DIAGNOSIS — R41841 Cognitive communication deficit: Secondary | ICD-10-CM | POA: Diagnosis not present

## 2018-09-17 DIAGNOSIS — R296 Repeated falls: Secondary | ICD-10-CM | POA: Diagnosis not present

## 2018-09-17 DIAGNOSIS — M6281 Muscle weakness (generalized): Secondary | ICD-10-CM | POA: Diagnosis not present

## 2018-09-17 DIAGNOSIS — R488 Other symbolic dysfunctions: Secondary | ICD-10-CM | POA: Diagnosis not present

## 2018-09-17 DIAGNOSIS — R41841 Cognitive communication deficit: Secondary | ICD-10-CM | POA: Diagnosis not present

## 2018-09-18 DIAGNOSIS — R296 Repeated falls: Secondary | ICD-10-CM | POA: Diagnosis not present

## 2018-09-18 DIAGNOSIS — R488 Other symbolic dysfunctions: Secondary | ICD-10-CM | POA: Diagnosis not present

## 2018-09-18 DIAGNOSIS — M6281 Muscle weakness (generalized): Secondary | ICD-10-CM | POA: Diagnosis not present

## 2018-09-18 DIAGNOSIS — R41841 Cognitive communication deficit: Secondary | ICD-10-CM | POA: Diagnosis not present

## 2018-09-21 DIAGNOSIS — M6281 Muscle weakness (generalized): Secondary | ICD-10-CM | POA: Diagnosis not present

## 2018-09-21 DIAGNOSIS — R296 Repeated falls: Secondary | ICD-10-CM | POA: Diagnosis not present

## 2018-09-21 DIAGNOSIS — R488 Other symbolic dysfunctions: Secondary | ICD-10-CM | POA: Diagnosis not present

## 2018-09-21 DIAGNOSIS — R41841 Cognitive communication deficit: Secondary | ICD-10-CM | POA: Diagnosis not present

## 2018-09-22 DIAGNOSIS — R296 Repeated falls: Secondary | ICD-10-CM | POA: Diagnosis not present

## 2018-09-22 DIAGNOSIS — R41841 Cognitive communication deficit: Secondary | ICD-10-CM | POA: Diagnosis not present

## 2018-09-22 DIAGNOSIS — R488 Other symbolic dysfunctions: Secondary | ICD-10-CM | POA: Diagnosis not present

## 2018-09-22 DIAGNOSIS — M6281 Muscle weakness (generalized): Secondary | ICD-10-CM | POA: Diagnosis not present

## 2018-09-23 DIAGNOSIS — M6281 Muscle weakness (generalized): Secondary | ICD-10-CM | POA: Diagnosis not present

## 2018-09-23 DIAGNOSIS — R41841 Cognitive communication deficit: Secondary | ICD-10-CM | POA: Diagnosis not present

## 2018-09-23 DIAGNOSIS — R488 Other symbolic dysfunctions: Secondary | ICD-10-CM | POA: Diagnosis not present

## 2018-09-23 DIAGNOSIS — R296 Repeated falls: Secondary | ICD-10-CM | POA: Diagnosis not present

## 2018-09-24 DIAGNOSIS — M6281 Muscle weakness (generalized): Secondary | ICD-10-CM | POA: Diagnosis not present

## 2018-09-24 DIAGNOSIS — R296 Repeated falls: Secondary | ICD-10-CM | POA: Diagnosis not present

## 2018-09-24 DIAGNOSIS — R41841 Cognitive communication deficit: Secondary | ICD-10-CM | POA: Diagnosis not present

## 2018-09-24 DIAGNOSIS — R488 Other symbolic dysfunctions: Secondary | ICD-10-CM | POA: Diagnosis not present

## 2018-09-28 DIAGNOSIS — R41841 Cognitive communication deficit: Secondary | ICD-10-CM | POA: Diagnosis not present

## 2018-09-28 DIAGNOSIS — M6281 Muscle weakness (generalized): Secondary | ICD-10-CM | POA: Diagnosis not present

## 2018-09-28 DIAGNOSIS — R488 Other symbolic dysfunctions: Secondary | ICD-10-CM | POA: Diagnosis not present

## 2018-09-28 DIAGNOSIS — R296 Repeated falls: Secondary | ICD-10-CM | POA: Diagnosis not present

## 2018-09-29 DIAGNOSIS — R41841 Cognitive communication deficit: Secondary | ICD-10-CM | POA: Diagnosis not present

## 2018-09-29 DIAGNOSIS — R488 Other symbolic dysfunctions: Secondary | ICD-10-CM | POA: Diagnosis not present

## 2018-09-29 DIAGNOSIS — R296 Repeated falls: Secondary | ICD-10-CM | POA: Diagnosis not present

## 2018-09-29 DIAGNOSIS — M6281 Muscle weakness (generalized): Secondary | ICD-10-CM | POA: Diagnosis not present

## 2018-09-30 DIAGNOSIS — R296 Repeated falls: Secondary | ICD-10-CM | POA: Diagnosis not present

## 2018-09-30 DIAGNOSIS — R41841 Cognitive communication deficit: Secondary | ICD-10-CM | POA: Diagnosis not present

## 2018-09-30 DIAGNOSIS — M6281 Muscle weakness (generalized): Secondary | ICD-10-CM | POA: Diagnosis not present

## 2018-09-30 DIAGNOSIS — R488 Other symbolic dysfunctions: Secondary | ICD-10-CM | POA: Diagnosis not present

## 2018-10-01 DIAGNOSIS — R296 Repeated falls: Secondary | ICD-10-CM | POA: Diagnosis not present

## 2018-10-01 DIAGNOSIS — M6281 Muscle weakness (generalized): Secondary | ICD-10-CM | POA: Diagnosis not present

## 2018-10-01 DIAGNOSIS — R488 Other symbolic dysfunctions: Secondary | ICD-10-CM | POA: Diagnosis not present

## 2018-10-01 DIAGNOSIS — R41841 Cognitive communication deficit: Secondary | ICD-10-CM | POA: Diagnosis not present

## 2018-10-02 DIAGNOSIS — M6281 Muscle weakness (generalized): Secondary | ICD-10-CM | POA: Diagnosis not present

## 2018-10-02 DIAGNOSIS — R41841 Cognitive communication deficit: Secondary | ICD-10-CM | POA: Diagnosis not present

## 2018-10-02 DIAGNOSIS — R488 Other symbolic dysfunctions: Secondary | ICD-10-CM | POA: Diagnosis not present

## 2018-10-02 DIAGNOSIS — R296 Repeated falls: Secondary | ICD-10-CM | POA: Diagnosis not present

## 2018-10-05 DIAGNOSIS — R296 Repeated falls: Secondary | ICD-10-CM | POA: Diagnosis not present

## 2018-10-05 DIAGNOSIS — R488 Other symbolic dysfunctions: Secondary | ICD-10-CM | POA: Diagnosis not present

## 2018-10-05 DIAGNOSIS — M6281 Muscle weakness (generalized): Secondary | ICD-10-CM | POA: Diagnosis not present

## 2018-10-05 DIAGNOSIS — R41841 Cognitive communication deficit: Secondary | ICD-10-CM | POA: Diagnosis not present

## 2018-10-06 DIAGNOSIS — R488 Other symbolic dysfunctions: Secondary | ICD-10-CM | POA: Diagnosis not present

## 2018-10-06 DIAGNOSIS — M6281 Muscle weakness (generalized): Secondary | ICD-10-CM | POA: Diagnosis not present

## 2018-10-06 DIAGNOSIS — R296 Repeated falls: Secondary | ICD-10-CM | POA: Diagnosis not present

## 2018-10-06 DIAGNOSIS — R41841 Cognitive communication deficit: Secondary | ICD-10-CM | POA: Diagnosis not present

## 2018-10-08 DIAGNOSIS — R41841 Cognitive communication deficit: Secondary | ICD-10-CM | POA: Diagnosis not present

## 2018-10-08 DIAGNOSIS — R488 Other symbolic dysfunctions: Secondary | ICD-10-CM | POA: Diagnosis not present

## 2018-10-08 DIAGNOSIS — M6281 Muscle weakness (generalized): Secondary | ICD-10-CM | POA: Diagnosis not present

## 2018-10-08 DIAGNOSIS — R296 Repeated falls: Secondary | ICD-10-CM | POA: Diagnosis not present

## 2018-10-12 DIAGNOSIS — M6281 Muscle weakness (generalized): Secondary | ICD-10-CM | POA: Diagnosis not present

## 2018-10-12 DIAGNOSIS — R296 Repeated falls: Secondary | ICD-10-CM | POA: Diagnosis not present

## 2018-10-12 DIAGNOSIS — R41841 Cognitive communication deficit: Secondary | ICD-10-CM | POA: Diagnosis not present

## 2018-10-12 DIAGNOSIS — R488 Other symbolic dysfunctions: Secondary | ICD-10-CM | POA: Diagnosis not present

## 2018-10-13 DIAGNOSIS — M6281 Muscle weakness (generalized): Secondary | ICD-10-CM | POA: Diagnosis not present

## 2018-10-13 DIAGNOSIS — R488 Other symbolic dysfunctions: Secondary | ICD-10-CM | POA: Diagnosis not present

## 2018-10-13 DIAGNOSIS — R296 Repeated falls: Secondary | ICD-10-CM | POA: Diagnosis not present

## 2018-10-13 DIAGNOSIS — L814 Other melanin hyperpigmentation: Secondary | ICD-10-CM | POA: Diagnosis not present

## 2018-10-13 DIAGNOSIS — L219 Seborrheic dermatitis, unspecified: Secondary | ICD-10-CM | POA: Diagnosis not present

## 2018-10-13 DIAGNOSIS — D1801 Hemangioma of skin and subcutaneous tissue: Secondary | ICD-10-CM | POA: Diagnosis not present

## 2018-10-13 DIAGNOSIS — L821 Other seborrheic keratosis: Secondary | ICD-10-CM | POA: Diagnosis not present

## 2018-10-13 DIAGNOSIS — R41841 Cognitive communication deficit: Secondary | ICD-10-CM | POA: Diagnosis not present

## 2018-10-14 DIAGNOSIS — R296 Repeated falls: Secondary | ICD-10-CM | POA: Diagnosis not present

## 2018-10-14 DIAGNOSIS — R488 Other symbolic dysfunctions: Secondary | ICD-10-CM | POA: Diagnosis not present

## 2018-10-14 DIAGNOSIS — R41841 Cognitive communication deficit: Secondary | ICD-10-CM | POA: Diagnosis not present

## 2018-10-14 DIAGNOSIS — M6281 Muscle weakness (generalized): Secondary | ICD-10-CM | POA: Diagnosis not present

## 2018-10-15 DIAGNOSIS — R296 Repeated falls: Secondary | ICD-10-CM | POA: Diagnosis not present

## 2018-10-15 DIAGNOSIS — R41841 Cognitive communication deficit: Secondary | ICD-10-CM | POA: Diagnosis not present

## 2018-10-15 DIAGNOSIS — R488 Other symbolic dysfunctions: Secondary | ICD-10-CM | POA: Diagnosis not present

## 2018-10-15 DIAGNOSIS — M6281 Muscle weakness (generalized): Secondary | ICD-10-CM | POA: Diagnosis not present

## 2018-10-20 DIAGNOSIS — M6281 Muscle weakness (generalized): Secondary | ICD-10-CM | POA: Diagnosis not present

## 2018-10-20 DIAGNOSIS — R296 Repeated falls: Secondary | ICD-10-CM | POA: Diagnosis not present

## 2018-10-20 DIAGNOSIS — R41841 Cognitive communication deficit: Secondary | ICD-10-CM | POA: Diagnosis not present

## 2018-10-20 DIAGNOSIS — R488 Other symbolic dysfunctions: Secondary | ICD-10-CM | POA: Diagnosis not present

## 2018-10-21 DIAGNOSIS — R41841 Cognitive communication deficit: Secondary | ICD-10-CM | POA: Diagnosis not present

## 2018-10-21 DIAGNOSIS — R488 Other symbolic dysfunctions: Secondary | ICD-10-CM | POA: Diagnosis not present

## 2018-10-21 DIAGNOSIS — R296 Repeated falls: Secondary | ICD-10-CM | POA: Diagnosis not present

## 2018-10-21 DIAGNOSIS — M6281 Muscle weakness (generalized): Secondary | ICD-10-CM | POA: Diagnosis not present

## 2018-10-26 DIAGNOSIS — R296 Repeated falls: Secondary | ICD-10-CM | POA: Diagnosis not present

## 2018-10-26 DIAGNOSIS — M6281 Muscle weakness (generalized): Secondary | ICD-10-CM | POA: Diagnosis not present

## 2018-10-26 DIAGNOSIS — R488 Other symbolic dysfunctions: Secondary | ICD-10-CM | POA: Diagnosis not present

## 2018-10-26 DIAGNOSIS — R41841 Cognitive communication deficit: Secondary | ICD-10-CM | POA: Diagnosis not present

## 2018-10-27 DIAGNOSIS — R41841 Cognitive communication deficit: Secondary | ICD-10-CM | POA: Diagnosis not present

## 2018-10-27 DIAGNOSIS — M6281 Muscle weakness (generalized): Secondary | ICD-10-CM | POA: Diagnosis not present

## 2018-10-27 DIAGNOSIS — R488 Other symbolic dysfunctions: Secondary | ICD-10-CM | POA: Diagnosis not present

## 2018-10-27 DIAGNOSIS — R296 Repeated falls: Secondary | ICD-10-CM | POA: Diagnosis not present

## 2018-10-29 DIAGNOSIS — M6281 Muscle weakness (generalized): Secondary | ICD-10-CM | POA: Diagnosis not present

## 2018-10-29 DIAGNOSIS — R488 Other symbolic dysfunctions: Secondary | ICD-10-CM | POA: Diagnosis not present

## 2018-10-29 DIAGNOSIS — R41841 Cognitive communication deficit: Secondary | ICD-10-CM | POA: Diagnosis not present

## 2018-10-29 DIAGNOSIS — R296 Repeated falls: Secondary | ICD-10-CM | POA: Diagnosis not present

## 2018-11-03 DIAGNOSIS — M6281 Muscle weakness (generalized): Secondary | ICD-10-CM | POA: Diagnosis not present

## 2018-11-03 DIAGNOSIS — R296 Repeated falls: Secondary | ICD-10-CM | POA: Diagnosis not present

## 2018-11-03 DIAGNOSIS — R488 Other symbolic dysfunctions: Secondary | ICD-10-CM | POA: Diagnosis not present

## 2018-11-03 DIAGNOSIS — R41841 Cognitive communication deficit: Secondary | ICD-10-CM | POA: Diagnosis not present

## 2018-11-05 DIAGNOSIS — R296 Repeated falls: Secondary | ICD-10-CM | POA: Diagnosis not present

## 2018-11-05 DIAGNOSIS — R488 Other symbolic dysfunctions: Secondary | ICD-10-CM | POA: Diagnosis not present

## 2018-11-05 DIAGNOSIS — R41841 Cognitive communication deficit: Secondary | ICD-10-CM | POA: Diagnosis not present

## 2018-11-05 DIAGNOSIS — M6281 Muscle weakness (generalized): Secondary | ICD-10-CM | POA: Diagnosis not present

## 2018-11-10 DIAGNOSIS — M6281 Muscle weakness (generalized): Secondary | ICD-10-CM | POA: Diagnosis not present

## 2018-11-10 DIAGNOSIS — R41841 Cognitive communication deficit: Secondary | ICD-10-CM | POA: Diagnosis not present

## 2018-11-10 DIAGNOSIS — R296 Repeated falls: Secondary | ICD-10-CM | POA: Diagnosis not present

## 2018-11-10 DIAGNOSIS — R488 Other symbolic dysfunctions: Secondary | ICD-10-CM | POA: Diagnosis not present

## 2018-11-12 DIAGNOSIS — M6281 Muscle weakness (generalized): Secondary | ICD-10-CM | POA: Diagnosis not present

## 2018-11-12 DIAGNOSIS — R41841 Cognitive communication deficit: Secondary | ICD-10-CM | POA: Diagnosis not present

## 2018-11-12 DIAGNOSIS — R296 Repeated falls: Secondary | ICD-10-CM | POA: Diagnosis not present

## 2018-11-12 DIAGNOSIS — R488 Other symbolic dysfunctions: Secondary | ICD-10-CM | POA: Diagnosis not present

## 2018-11-19 DIAGNOSIS — M6281 Muscle weakness (generalized): Secondary | ICD-10-CM | POA: Diagnosis not present

## 2018-11-19 DIAGNOSIS — R488 Other symbolic dysfunctions: Secondary | ICD-10-CM | POA: Diagnosis not present

## 2018-11-19 DIAGNOSIS — R41841 Cognitive communication deficit: Secondary | ICD-10-CM | POA: Diagnosis not present

## 2018-11-19 DIAGNOSIS — R296 Repeated falls: Secondary | ICD-10-CM | POA: Diagnosis not present

## 2018-11-20 DIAGNOSIS — R296 Repeated falls: Secondary | ICD-10-CM | POA: Diagnosis not present

## 2018-11-20 DIAGNOSIS — R488 Other symbolic dysfunctions: Secondary | ICD-10-CM | POA: Diagnosis not present

## 2018-11-20 DIAGNOSIS — R41841 Cognitive communication deficit: Secondary | ICD-10-CM | POA: Diagnosis not present

## 2018-11-20 DIAGNOSIS — M6281 Muscle weakness (generalized): Secondary | ICD-10-CM | POA: Diagnosis not present

## 2018-12-08 DIAGNOSIS — R41841 Cognitive communication deficit: Secondary | ICD-10-CM | POA: Diagnosis not present

## 2018-12-09 DIAGNOSIS — R41841 Cognitive communication deficit: Secondary | ICD-10-CM | POA: Diagnosis not present

## 2018-12-10 DIAGNOSIS — R41841 Cognitive communication deficit: Secondary | ICD-10-CM | POA: Diagnosis not present

## 2018-12-14 DIAGNOSIS — R41841 Cognitive communication deficit: Secondary | ICD-10-CM | POA: Diagnosis not present

## 2018-12-16 DIAGNOSIS — R41841 Cognitive communication deficit: Secondary | ICD-10-CM | POA: Diagnosis not present

## 2019-01-07 ENCOUNTER — Ambulatory Visit: Payer: Medicare Other | Admitting: Podiatry

## 2019-01-08 ENCOUNTER — Ambulatory Visit (INDEPENDENT_AMBULATORY_CARE_PROVIDER_SITE_OTHER): Payer: Medicare Other | Admitting: Podiatry

## 2019-01-08 ENCOUNTER — Encounter: Payer: Self-pay | Admitting: Podiatry

## 2019-01-08 DIAGNOSIS — M79675 Pain in left toe(s): Secondary | ICD-10-CM

## 2019-01-08 DIAGNOSIS — M79674 Pain in right toe(s): Secondary | ICD-10-CM | POA: Diagnosis not present

## 2019-01-08 DIAGNOSIS — E7800 Pure hypercholesterolemia, unspecified: Secondary | ICD-10-CM | POA: Insufficient documentation

## 2019-01-08 DIAGNOSIS — L600 Ingrowing nail: Secondary | ICD-10-CM

## 2019-01-08 DIAGNOSIS — B351 Tinea unguium: Secondary | ICD-10-CM | POA: Diagnosis not present

## 2019-01-08 DIAGNOSIS — G5602 Carpal tunnel syndrome, left upper limb: Secondary | ICD-10-CM | POA: Insufficient documentation

## 2019-01-08 DIAGNOSIS — I6930 Unspecified sequelae of cerebral infarction: Secondary | ICD-10-CM | POA: Insufficient documentation

## 2019-01-08 DIAGNOSIS — F321 Major depressive disorder, single episode, moderate: Secondary | ICD-10-CM | POA: Insufficient documentation

## 2019-01-08 DIAGNOSIS — R6881 Early satiety: Secondary | ICD-10-CM | POA: Insufficient documentation

## 2019-01-08 DIAGNOSIS — Z1211 Encounter for screening for malignant neoplasm of colon: Secondary | ICD-10-CM | POA: Insufficient documentation

## 2019-01-08 DIAGNOSIS — M1990 Unspecified osteoarthritis, unspecified site: Secondary | ICD-10-CM | POA: Insufficient documentation

## 2019-01-08 NOTE — Patient Instructions (Signed)

## 2019-01-11 NOTE — Progress Notes (Signed)
Subjective:   Patient ID: [name], female   DOB: 85 y.o.   MRN: [id number]   HPI Patient states that his right big toenail is really bothering me making it hard to wear shoe gear comfortably and not the left one bothers me some but not to the same degree and all my nails are thickened and I cannot cut them and they become painful in shoe gear.  No change in health history   ROS      Objective:  Physical Exam  Neurovascular status intact muscle strength is adequate range of motion within normal limits with patient found to have incurvated right hallux medial border that is painful when pressed and is noted to have thickened nailbeds 1-5 both feet with yellow subungual debris and moderate discomfort with pressure.  Left hallux nail is also tender but not to the same degree     Assessment:  Chronic ingrown toenail deformity right over left hallux with incurvation of the bed and mycotic nail infection with pain 1-5 both feet     Plan:  H&P and discussed both conditions.  At this time I am get a focus on the ingrown toenail right with possible correction of the left and also debridement of nailbeds 1-5 both feet.  I went ahead and allowed her to read consent form concerning correction of nail understanding risk and today I infiltrated the right hallux 60 mg like Marcaine mixture sterile prep applied to the toe and using sterile instrumentation I remove the medial border exposed matrix and applied phenol 3 applications 30 seconds followed by alcohol lavage and sterile dressing.  Gave instructions on soaks and patient will be seen back for [place] to recheck and encouraged to leave the dressing on 24 hours but to take it off earlier if any symptoms were to occur.  Debrided all nailbeds 1-5 both feet with no iatrogenic bleeding noted

## 2019-01-15 DIAGNOSIS — R410 Disorientation, unspecified: Secondary | ICD-10-CM | POA: Diagnosis not present

## 2019-03-02 DIAGNOSIS — N183 Chronic kidney disease, stage 3 (moderate): Secondary | ICD-10-CM | POA: Diagnosis not present

## 2019-03-02 DIAGNOSIS — E1121 Type 2 diabetes mellitus with diabetic nephropathy: Secondary | ICD-10-CM | POA: Diagnosis not present

## 2019-03-02 DIAGNOSIS — E11319 Type 2 diabetes mellitus with unspecified diabetic retinopathy without macular edema: Secondary | ICD-10-CM | POA: Diagnosis not present

## 2019-03-02 DIAGNOSIS — I129 Hypertensive chronic kidney disease with stage 1 through stage 4 chronic kidney disease, or unspecified chronic kidney disease: Secondary | ICD-10-CM | POA: Diagnosis not present

## 2019-03-10 DIAGNOSIS — H353131 Nonexudative age-related macular degeneration, bilateral, early dry stage: Secondary | ICD-10-CM | POA: Diagnosis not present

## 2019-03-10 DIAGNOSIS — H5310 Unspecified subjective visual disturbances: Secondary | ICD-10-CM | POA: Diagnosis not present

## 2019-03-10 DIAGNOSIS — E119 Type 2 diabetes mellitus without complications: Secondary | ICD-10-CM | POA: Diagnosis not present

## 2019-03-10 DIAGNOSIS — H5340 Unspecified visual field defects: Secondary | ICD-10-CM | POA: Diagnosis not present

## 2019-04-08 ENCOUNTER — Ambulatory Visit: Payer: Medicare Other | Admitting: Podiatry

## 2019-04-26 DIAGNOSIS — Z0000 Encounter for general adult medical examination without abnormal findings: Secondary | ICD-10-CM | POA: Diagnosis not present

## 2019-04-27 ENCOUNTER — Emergency Department (HOSPITAL_COMMUNITY)
Admission: EM | Admit: 2019-04-27 | Discharge: 2019-04-28 | Disposition: A | Discharge status: Home / Self Care | Payer: Medicare Other | Attending: Emergency Medicine | Admitting: Emergency Medicine

## 2019-04-27 ENCOUNTER — Emergency Department (HOSPITAL_COMMUNITY): Payer: Medicare Other

## 2019-04-27 ENCOUNTER — Other Ambulatory Visit: Payer: Self-pay

## 2019-04-27 DIAGNOSIS — Z7982 Long term (current) use of aspirin: Secondary | ICD-10-CM | POA: Diagnosis not present

## 2019-04-27 DIAGNOSIS — Z79899 Other long term (current) drug therapy: Secondary | ICD-10-CM | POA: Insufficient documentation

## 2019-04-27 DIAGNOSIS — R1084 Generalized abdominal pain: Secondary | ICD-10-CM | POA: Diagnosis not present

## 2019-04-27 DIAGNOSIS — F0390 Unspecified dementia without behavioral disturbance: Secondary | ICD-10-CM | POA: Diagnosis not present

## 2019-04-27 DIAGNOSIS — R404 Transient alteration of awareness: Secondary | ICD-10-CM | POA: Diagnosis not present

## 2019-04-27 DIAGNOSIS — R442 Other hallucinations: Secondary | ICD-10-CM | POA: Diagnosis not present

## 2019-04-27 DIAGNOSIS — R52 Pain, unspecified: Secondary | ICD-10-CM | POA: Diagnosis not present

## 2019-04-27 DIAGNOSIS — R4182 Altered mental status, unspecified: Secondary | ICD-10-CM | POA: Diagnosis present

## 2019-04-27 LAB — URINALYSIS, ROUTINE W REFLEX MICROSCOPIC
Bilirubin Urine: NEGATIVE
Glucose, UA: NEGATIVE mg/dL
Hgb urine dipstick: NEGATIVE
Ketones, ur: NEGATIVE mg/dL
Nitrite: NEGATIVE
Protein, ur: 30 mg/dL — AB
Specific Gravity, Urine: 1.017 (ref 1.005–1.030)
pH: 5 (ref 5.0–8.0)

## 2019-04-27 LAB — COMPREHENSIVE METABOLIC PANEL
ALT: 17 U/L (ref 0–44)
AST: 22 U/L (ref 15–41)
Albumin: 3.8 g/dL (ref 3.5–5.0)
Alkaline Phosphatase: 85 U/L (ref 38–126)
Anion gap: 10 (ref 5–15)
BUN: 29 mg/dL — ABNORMAL HIGH (ref 8–23)
CO2: 24 mmol/L (ref 22–32)
Calcium: 9.2 mg/dL (ref 8.9–10.3)
Chloride: 107 mmol/L (ref 98–111)
Creatinine, Ser: 1.05 mg/dL — ABNORMAL HIGH (ref 0.44–1.00)
GFR calc Af Amer: 57 mL/min — ABNORMAL LOW (ref >60)
GFR calc non Af Amer: 49 mL/min — ABNORMAL LOW (ref >60)
Glucose, Bld: 146 mg/dL — ABNORMAL HIGH (ref 70–99)
Potassium: 3.6 mmol/L (ref 3.5–5.1)
Sodium: 141 mmol/L (ref 135–145)
Total Bilirubin: 0.6 mg/dL (ref 0.3–1.2)
Total Protein: 6.3 g/dL — ABNORMAL LOW (ref 6.5–8.1)

## 2019-04-27 LAB — CBC WITH DIFFERENTIAL/PLATELET
Abs Immature Granulocytes: 0.02 10*3/uL (ref 0.00–0.07)
Basophils Absolute: 0 10*3/uL (ref 0.0–0.1)
Basophils Relative: 1 %
Eosinophils Absolute: 0.1 10*3/uL (ref 0.0–0.5)
Eosinophils Relative: 2 %
HCT: 33.3 % — ABNORMAL LOW (ref 36.0–46.0)
Hemoglobin: 11.2 g/dL — ABNORMAL LOW (ref 12.0–15.0)
Immature Granulocytes: 0 %
Lymphocytes Relative: 24 %
Lymphs Abs: 1.3 10*3/uL (ref 0.7–4.0)
MCH: 33.3 pg (ref 26.0–34.0)
MCHC: 33.6 g/dL (ref 30.0–36.0)
MCV: 99.1 fL (ref 80.0–100.0)
Monocytes Absolute: 0.5 10*3/uL (ref 0.1–1.0)
Monocytes Relative: 8 %
Neutro Abs: 3.7 10*3/uL (ref 1.7–7.7)
Neutrophils Relative %: 65 %
Platelets: 205 10*3/uL (ref 150–400)
RBC: 3.36 MIL/uL — ABNORMAL LOW (ref 3.87–5.11)
RDW: 12 % (ref 11.5–15.5)
WBC: 5.6 10*3/uL (ref 4.0–10.5)
nRBC: 0 % (ref 0.0–0.2)

## 2019-04-27 LAB — BRAIN NATRIURETIC PEPTIDE: B Natriuretic Peptide: 65.2 pg/mL (ref 0.0–100.0)

## 2019-04-27 MED ORDER — SODIUM CHLORIDE 0.9 % IV BOLUS
250.0000 mL | Freq: Once | INTRAVENOUS | Status: AC
  Administered 2019-04-27: 250 mL via INTRAVENOUS

## 2019-04-27 MED ORDER — SODIUM CHLORIDE 0.9 % IV SOLN: INTRAVENOUS | Status: DC

## 2019-04-27 NOTE — ED Notes (Signed)
Sister here and information updated with pharmacy and MD.  Reportedly pt is at baseline for confusion.  Sister to attend to other matters outside the hospital and will return to transport pt home after test results.

## 2019-04-27 NOTE — ED Provider Notes (Signed)
[facility] EMERGENCY DEPARTMENT Provider Note   CSN: [id number] Arrival date & time: 04/27/19  2778    History   Chief Complaint No chief complaint on file.   HPI [name] is a 85 y.o. female.     Patient sent in from an adult living center.  Patient's sister are very helpful in filling in the historical information.  Patient has a history of dementia.  Had been staying with the sister during the [place] pandemic and just recently went back to the nursing facility last night was her first night back.  It is an independent living situation she has somebody stays with her during the daytime.  Apparently patient during the middle of the night got confused and was in the hallway screaming.  Sister states that patient is neurologically at her baseline.  And was completely fine yesterday.  No symptoms.  Also had recent COVID testing to go back to the facility that was negative.  Sister sees no real concerns currently.     No past medical history on file.  There are no active problems to display for this patient.      OB History   No obstetric history on file.      Home Medications    Prior to Admission medications   Medication Sig Start Date End Date Taking? Authorizing Provider  aspirin EC 81 MG tablet Take 81 mg by mouth daily.   Yes [provider]  atorvastatin (LIPITOR) 40 MG tablet Take 40 mg by mouth daily.   Yes [provider]  busPIRone (BUSPAR) 5 MG tablet Take 5 mg by mouth 2 (two) times daily.   Yes [provider]  calcium carbonate (OS-CAL - DOSED IN MG OF ELEMENTAL CALCIUM) 1250 (500 Ca) MG tablet Take 1 tablet by mouth daily.   Yes [provider]  felodipine (PLENDIL) 5 MG 24 hr tablet Take 5 mg by mouth daily.   Yes [provider]  levothyroxine (SYNTHROID) 50 MCG tablet Take 50 mcg by mouth daily before breakfast.   Yes [provider]  losartan (COZAAR) 100 MG tablet Take 100 mg  by mouth daily.   Yes [provider]  metformin (FORTAMET) 500 MG (OSM) 24 hr tablet Take 500 mg by mouth daily with breakfast.   Yes [provider]  Multiple Vitamins-Minerals (PRESERVISION AREDS 2 PO) Take 1 tablet by mouth daily at 12 noon.   Yes [provider]  omeprazole (PRILOSEC) 20 MG capsule Take 20 mg by mouth daily.   Yes [provider]    Family History No family history on file.  Social History Social History   Tobacco Use  . Smoking status: Not on file  Substance Use Topics  . Alcohol use: Not on file  . Drug use: Not on file     Allergies   Patient has no known allergies.   Review of Systems Review of Systems  Unable to perform ROS: Dementia     Physical Exam Updated Vital Signs BP (!) 160/71   Pulse (!) 49   Temp 97.7 F (36.5 C) (Oral)   Resp 13   Ht 1.524 m (5')   Wt 48.5 kg   SpO2 97%   BMI 20.90 kg/m   Physical Exam Vitals signs and nursing note reviewed.  Constitutional:      General: She is not in acute distress.    Appearance: Normal appearance. She is well-developed.  HENT:  Head: Normocephalic and atraumatic.  Eyes:     Extraocular Movements: Extraocular movements intact.     Conjunctiva/sclera: Conjunctivae normal.     Pupils: Pupils are equal, round, and reactive to light.  Neck:     Musculoskeletal: Normal range of motion and neck supple.  Cardiovascular:     Rate and Rhythm: Normal rate and regular rhythm.     Heart sounds: No murmur.  Pulmonary:     Effort: Pulmonary effort is normal. No respiratory distress.     Breath sounds: Normal breath sounds.  Abdominal:     Palpations: Abdomen is soft.     Tenderness: There is no abdominal tenderness.  Musculoskeletal: Normal range of motion.        General: No swelling.  Skin:    General: Skin is warm and dry.  Neurological:     General: No focal deficit present.     Mental Status: She is alert. Mental status is at baseline.       ED Treatments / Results  Labs (all labs ordered are listed, but only abnormal results are displayed) Labs Reviewed  COMPREHENSIVE METABOLIC PANEL - Abnormal; Notable for the following components:      Result Value   Glucose, Bld 146 (*)    BUN 29 (*)    Creatinine, Ser 1.05 (*)    Total Protein 6.3 (*)    GFR calc non Af Amer 49 (*)    GFR calc Af Amer 57 (*)    All other components within normal limits  CBC WITH DIFFERENTIAL/PLATELET - Abnormal; Notable for the following components:   RBC 3.36 (*)    Hemoglobin 11.2 (*)    HCT 33.3 (*)    All other components within normal limits  BRAIN NATRIURETIC PEPTIDE  CBC WITH DIFFERENTIAL/PLATELET  URINALYSIS, ROUTINE W REFLEX MICROSCOPIC    EKG EKG Interpretation  Date/Time:  Tuesday April 27 2019 07:26:54 EDT Ventricular Rate:  51 PR Interval:    QRS Duration: 104 QT Interval:  426 QTC Calculation: 393 R Axis:   78 Text Interpretation:  Sinus rhythm Low voltage, extremity and precordial leads ST elevation, consider inferior injury No previous ECGs available Confirmed by Fredia Sorrow [phone]) on 04/27/2019 7:59:48 AM Also confirmed by Fredia Sorrow [phone]), editor Philomena Doheny [phone])  on 04/27/2019 8:19:49 AM   Radiology Dg Chest Port 1 View  Result Date: 04/27/2019 CLINICAL DATA:  85 year old with altered mental status. EXAM: PORTABLE CHEST 1 VIEW COMPARISON:  10/06/2016 FINDINGS: Both lungs are clear. Heart and mediastinum are within normal limits. Atherosclerotic calcifications at the aortic arch. Negative for a pneumothorax. Degenerative changes at the [facility] joints. IMPRESSION: No acute cardiopulmonary disease. Electronically Signed   By: Markus Daft M.D.   On: 04/27/2019 08:09    Procedures Procedures (including critical care time)  Medications Ordered in ED Medications  0.9 %  sodium chloride infusion ( Intravenous Rate/Dose Change 04/27/19 0927)  sodium chloride 0.9 % bolus 250 mL (0 mLs Intravenous Stopped 04/27/19 0927)      Initial Impression / Assessment and Plan / ED Course  I have reviewed the triage vital signs and the nursing notes.  Pertinent labs & imaging results that were available during my care of the patient were reviewed by me and considered in my medical decision making (see chart for details).        Patient's basic labs without any significant abnormality.  Chest x-ray negative.  Urinalysis has not been collected yet in the sister does not  want a wait for that.  Probably no immediate concern for urinary tract infection based on the history that was provided.  Sounds as if the nursing facility not being familiar with her sent her in for sort of baseline behavior.  Patient's been pleasant and cooperative here her sister is fine with her being discharged back home.  She will go back to the nursing facility the sister is going to get somebody to sit with her overnight.  Final Clinical Impressions(s) / ED Diagnoses   Final diagnoses:  Dementia without behavioral disturbance, unspecified dementia type [facility])    ED Discharge Orders    None       Fredia Sorrow, MD 04/27/19 1108

## 2019-04-27 NOTE — Discharge Instructions (Signed)
Return for any new or worse symptoms.  Work-up without any acute findings.

## 2019-04-27 NOTE — ED Notes (Signed)
Sister has returned and questions discharge time.  Urine collected, no other changes noted in pt.

## 2019-04-27 NOTE — ED Notes (Signed)
Sister - Butch Penny called and is on her way to the hospital to assist.

## 2019-04-27 NOTE — ED Triage Notes (Signed)
Pt found by friend this AM at ALF and statiing pt was altered and not acting appropriate.  Pt seeing her mother this morning.   Pt able to answer most questons, unsure of place and date.  Hoping that we will find her mother.  No pain responses or SOB.

## 2019-04-27 NOTE — ED Notes (Signed)
Bolus complete and pt remains unchanged and repeats same questions about POC.  Restful without distress.

## 2019-06-01 DIAGNOSIS — E119 Type 2 diabetes mellitus without complications: Secondary | ICD-10-CM | POA: Diagnosis not present

## 2019-06-17 DIAGNOSIS — I129 Hypertensive chronic kidney disease with stage 1 through stage 4 chronic kidney disease, or unspecified chronic kidney disease: Secondary | ICD-10-CM | POA: Diagnosis not present

## 2019-06-17 DIAGNOSIS — E1121 Type 2 diabetes mellitus with diabetic nephropathy: Secondary | ICD-10-CM | POA: Diagnosis not present

## 2019-06-17 DIAGNOSIS — N183 Chronic kidney disease, stage 3 (moderate): Secondary | ICD-10-CM | POA: Diagnosis not present

## 2019-06-17 DIAGNOSIS — E11319 Type 2 diabetes mellitus with unspecified diabetic retinopathy without macular edema: Secondary | ICD-10-CM | POA: Diagnosis not present

## 2019-07-16 DIAGNOSIS — R319 Hematuria, unspecified: Secondary | ICD-10-CM | POA: Diagnosis not present

## 2019-07-16 DIAGNOSIS — N390 Urinary tract infection, site not specified: Secondary | ICD-10-CM | POA: Diagnosis not present

## 2019-07-16 DIAGNOSIS — R4182 Altered mental status, unspecified: Secondary | ICD-10-CM | POA: Diagnosis not present

## 2019-09-04 DIAGNOSIS — E119 Type 2 diabetes mellitus without complications: Secondary | ICD-10-CM | POA: Diagnosis not present

## 2019-12-02 ENCOUNTER — Emergency Department (HOSPITAL_COMMUNITY)
Admission: EM | Admit: 2019-12-02 | Discharge: 2019-12-02 | Disposition: A | Discharge status: Home / Self Care | Payer: Medicare Other | Attending: Emergency Medicine | Admitting: Emergency Medicine

## 2019-12-02 ENCOUNTER — Other Ambulatory Visit: Payer: Self-pay

## 2019-12-02 ENCOUNTER — Encounter (HOSPITAL_COMMUNITY): Payer: Self-pay

## 2019-12-02 DIAGNOSIS — Z87891 Personal history of nicotine dependence: Secondary | ICD-10-CM | POA: Insufficient documentation

## 2019-12-02 DIAGNOSIS — Z853 Personal history of malignant neoplasm of breast: Secondary | ICD-10-CM | POA: Diagnosis not present

## 2019-12-02 DIAGNOSIS — R531 Weakness: Secondary | ICD-10-CM | POA: Diagnosis not present

## 2019-12-02 DIAGNOSIS — E119 Type 2 diabetes mellitus without complications: Secondary | ICD-10-CM | POA: Insufficient documentation

## 2019-12-02 DIAGNOSIS — Z79899 Other long term (current) drug therapy: Secondary | ICD-10-CM | POA: Diagnosis not present

## 2019-12-02 DIAGNOSIS — Z7982 Long term (current) use of aspirin: Secondary | ICD-10-CM | POA: Diagnosis not present

## 2019-12-02 DIAGNOSIS — F0390 Unspecified dementia without behavioral disturbance: Secondary | ICD-10-CM

## 2019-12-02 DIAGNOSIS — Z7984 Long term (current) use of oral hypoglycemic drugs: Secondary | ICD-10-CM | POA: Diagnosis not present

## 2019-12-02 DIAGNOSIS — R404 Transient alteration of awareness: Secondary | ICD-10-CM | POA: Diagnosis not present

## 2019-12-02 DIAGNOSIS — E039 Hypothyroidism, unspecified: Secondary | ICD-10-CM | POA: Insufficient documentation

## 2019-12-02 DIAGNOSIS — Z8673 Personal history of transient ischemic attack (TIA), and cerebral infarction without residual deficits: Secondary | ICD-10-CM | POA: Diagnosis not present

## 2019-12-02 DIAGNOSIS — R4182 Altered mental status, unspecified: Secondary | ICD-10-CM | POA: Diagnosis not present

## 2019-12-02 DIAGNOSIS — I10 Essential (primary) hypertension: Secondary | ICD-10-CM | POA: Diagnosis not present

## 2019-12-02 DIAGNOSIS — R001 Bradycardia, unspecified: Secondary | ICD-10-CM | POA: Diagnosis not present

## 2019-12-02 DIAGNOSIS — R55 Syncope and collapse: Secondary | ICD-10-CM | POA: Diagnosis not present

## 2019-12-02 DIAGNOSIS — Z743 Need for continuous supervision: Secondary | ICD-10-CM | POA: Diagnosis not present

## 2019-12-02 DIAGNOSIS — R279 Unspecified lack of coordination: Secondary | ICD-10-CM | POA: Diagnosis not present

## 2019-12-02 LAB — CBC
HCT: 36.2 % (ref 36.0–46.0)
Hemoglobin: 12.2 g/dL (ref 12.0–15.0)
MCH: 33.3 pg (ref 26.0–34.0)
MCHC: 33.7 g/dL (ref 30.0–36.0)
MCV: 98.9 fL (ref 80.0–100.0)
Platelets: 223 10*3/uL (ref 150–400)
RBC: 3.66 MIL/uL — ABNORMAL LOW (ref 3.87–5.11)
RDW: 12.8 % (ref 11.5–15.5)
WBC: 6 10*3/uL (ref 4.0–10.5)
nRBC: 0 % (ref 0.0–0.2)

## 2019-12-02 LAB — COMPREHENSIVE METABOLIC PANEL
ALT: 19 U/L (ref 0–44)
AST: 25 U/L (ref 15–41)
Albumin: 4 g/dL (ref 3.5–5.0)
Alkaline Phosphatase: 63 U/L (ref 38–126)
Anion gap: 8 (ref 5–15)
BUN: 22 mg/dL (ref 8–23)
CO2: 27 mmol/L (ref 22–32)
Calcium: 10.5 mg/dL — ABNORMAL HIGH (ref 8.9–10.3)
Chloride: 107 mmol/L (ref 98–111)
Creatinine, Ser: 1.03 mg/dL — ABNORMAL HIGH (ref 0.44–1.00)
GFR calc Af Amer: 58 mL/min — ABNORMAL LOW (ref >60)
GFR calc non Af Amer: 50 mL/min — ABNORMAL LOW (ref >60)
Glucose, Bld: 137 mg/dL — ABNORMAL HIGH (ref 70–99)
Potassium: 4.1 mmol/L (ref 3.5–5.1)
Sodium: 142 mmol/L (ref 135–145)
Total Bilirubin: 0.9 mg/dL (ref 0.3–1.2)
Total Protein: 7 g/dL (ref 6.5–8.1)

## 2019-12-02 MED ORDER — SODIUM CHLORIDE 0.9% FLUSH
3.0000 mL | Freq: Once | INTRAVENOUS | Status: AC
  Administered 2019-12-02: 3 mL via INTRAVENOUS

## 2019-12-02 MED ORDER — ALPRAZOLAM 0.25 MG PO TABS
0.2500 mg | ORAL_TABLET | Freq: Once | ORAL | Status: AC
  Administered 2019-12-02: 0.25 mg via ORAL
  Filled 2019-12-02: qty 1

## 2019-12-02 NOTE — ED Provider Notes (Signed)
[facility] EMERGENCY DEPARTMENT Provider Note   CSN: [id number] Arrival date & time: 12/02/19  1110     History Chief Complaint  Patient presents with  . Altered Mental Status    [name] is a 86 y.o. female.  Patient drug brought in from nursing facility.  Patient walked from her bed to the common area at about 950.  EMS called to 1015 for unresponsive and lethargic.  EMS reported that patient was not responding on their arrival but immediately woke up when EMS started IV patient usually amatory with walker.  Upon arrival here patient ambulatory to bathroom on arrival with two-person assist.  Blood sugar was 118.  Temp was 97.7.  Oxygen sats on room air were 99%.  Patient with a history of dementia.  Patient is actually been a little bit on the agitated side here.        Past Medical History:  Diagnosis Date  . Breast cancer ([place])   . Cancer [facility])    breast cancer on left - 10  years ago  . Depression   . Diabetes mellitus without complication ([place])    type 2  . Family history of adverse reaction to anesthesia    Sister gets sick  . GERD (gastroesophageal reflux disease)   . Heart murmur    as a child  . Hypertension   . Hypothyroidism   . Personal history of radiation therapy 2010  . PONV (postoperative nausea and vomiting)   . Wears glasses     Patient Active Problem List   Diagnosis Date Noted  . Arthritis 01/08/2019  . Carpal tunnel syndrome on left 01/08/2019  . Colon cancer screening 01/08/2019  . Early satiety 01/08/2019  . Late effect of stroke 01/08/2019  . Moderate single current episode of major depressive disorder ([place]) 01/08/2019  . Pure hypercholesterolemia 01/08/2019  . Non-intractable vomiting with nausea   . Concussion 11/22/2016  . Post concussive syndrome 11/21/2016  . Post concussion syndrome 11/21/2016  . Hematoma of scalp   . Generalized anxiety disorder 10/23/2016  . Severe episode of recurrent major  depressive disorder, without psychotic features ([place])   . Reactive depression   . Debilitated 10/09/2016  . Transient cerebral ischemia   . Anxiety state   . Controlled type 2 diabetes mellitus with complication, without long-term current use of insulin ([place])   . History of breast cancer   . Acute blood loss anemia   . Stage 2 chronic kidney disease   . Syncope 10/07/2016  . Stroke ([place]) 10/06/2016  . Stroke (cerebrum) ([place]) 10/06/2016  . Aphasia 10/06/2016  . Leukocytosis 10/06/2016  . Essential hypertension 10/06/2016  . Hypothyroidism 10/06/2016  . GERD (gastroesophageal reflux disease) 10/06/2016  . Hyperlipemia 10/06/2016  . Stenosing tenosynovitis 07/12/2015    Past Surgical History:  Procedure Laterality Date  . ABDOMINAL HYSTERECTOMY    . APPENDECTOMY    . ARCUATE KERATECTOMY    . BREAST LUMPECTOMY Left 2010/ 2018   Dr. Dalbert Batman for 2018 surgery  . BREAST LUMPECTOMY Left 07/18/2017   Procedure: LEFT BREAST LUMPECTOMY;  Surgeon: Fanny Skates, MD;  Location: [facility];  Service: General;  Laterality: Left;  . BUNIONECTOMY     right  . CARPAL TUNNEL RELEASE     right  . CARPAL TUNNEL RELEASE Left 05/24/2014   Procedure: LEFT CARPAL TUNNEL RELEASE LEFT THUMB TRIGGER FINGER RELEASE;  Surgeon: Cammie Sickle, MD;  Location: [facility];  Service: Orthopedics;  Laterality: Left;  left thumb also site of incision  . DEBRIDEMENT OF ABDOMINAL WALL ABSCESS  1978  . OVARIAN CYST SURGERY     x2-  . TONSILLECTOMY    . TRIGGER FINGER RELEASE Left 01/25/2016   Procedure: RELEASE A-1 PULLEY LEFT RING FINGER, LEFT SMALL FINGER;  Surgeon: Daryll Brod, MD;  Location: [facility];  Service: Orthopedics;  Laterality: Left;  ANESTHESIA: IV REGIONAL FAB     OB History   No obstetric history on file.     Family History  Problem Relation Age of Onset  . Hypertension Other     Social History   Tobacco Use  . Smoking status: Former Smoker    Types:  Cigarettes  . Smokeless tobacco: Never Used  . Tobacco comment: Quit smoking  30 years agl  Substance Use Topics  . Alcohol use: No    Alcohol/week: 0.0 standard drinks  . Drug use: No    Home Medications Prior to Admission medications   Medication Sig Start Date End Date Taking? Authorizing Provider  acetaminophen (TYLENOL) 500 MG tablet Take 500 mg by mouth every 6 (six) hours as needed (for pain. (typically before bed)).    [provider]  ALPRAZolam Duanne Moron) 0.25 MG tablet Take 0.25 mg by mouth 3 (three) times daily as needed for anxiety.     [provider]  Ascorbic Acid (VITAMIN C) 1000 MG tablet Take 1,000 mg by mouth daily.    [provider]  aspirin EC 81 MG tablet Take 81 mg by mouth at bedtime.    [provider]  aspirin EC 81 MG tablet Take 81 mg by mouth daily.    [provider]  atorvastatin (LIPITOR) 40 MG tablet Take 1 tablet (40 mg total) by mouth daily at 6 PM. 10/09/16   Eulogio Bear U, DO  atorvastatin (LIPITOR) 40 MG tablet Take 40 mg by mouth daily.    [provider]  busPIRone (BUSPAR) 5 MG tablet  12/28/18   [provider]  busPIRone (BUSPAR) 5 MG tablet Take 5 mg by mouth 2 (two) times daily.    [provider]  calcium carbonate (OS-CAL - DOSED IN MG OF ELEMENTAL CALCIUM) 1250 (500 Ca) MG tablet Take 1 tablet by mouth daily.    [provider]  felodipine (PLENDIL) 5 MG 24 hr tablet Take 5 mg by mouth daily.    [provider]  felodipine (PLENDIL) 5 MG 24 hr tablet Take 5 mg by mouth daily.    [provider]  HYDROcodone-acetaminophen (NORCO) 5-325 MG tablet Take 1 tablet by mouth every 6 (six) hours as needed for moderate pain or severe pain. 07/18/17   Fanny Skates, MD  levothyroxine (SYNTHROID) 50 MCG tablet Take 50 mcg by mouth daily before breakfast.    [provider]  levothyroxine (SYNTHROID, LEVOTHROID) 50 MCG tablet Take 50 mcg by mouth  daily before breakfast.    [provider]  losartan (COZAAR) 100 MG tablet Take 1 tablet (100 mg total) by mouth daily. 10/16/16   Love, Ivan Anchors, PA-C  losartan (COZAAR) 100 MG tablet Take 100 mg by mouth daily.    [provider]  Melatonin 10 MG TABS Take 10 mg by mouth at bedtime as needed (for sleep.).    [provider]  metformin (FORTAMET) 500 MG (OSM) 24 hr tablet Take 500 mg by mouth daily with breakfast.    [provider]  metFORMIN (GLUCOPHAGE-XR) 500 MG 24  hr tablet Take 500 mg by mouth every evening. 05/09/17   [provider]  Multiple Vitamin (MULTIVITAMIN WITH MINERALS) TABS tablet Take 1 tablet by mouth daily. One A Day    [provider]  Multiple Vitamins-Minerals (OCUVITE PO) Take 1 tablet by mouth daily.    [provider]  Multiple Vitamins-Minerals (PRESERVISION AREDS 2 PO) Take 1 tablet by mouth daily at 12 noon.    [provider]  omeprazole (PRILOSEC) 20 MG capsule Take 20 mg by mouth daily before breakfast.    [provider]  omeprazole (PRILOSEC) 20 MG capsule Take 20 mg by mouth daily.    [provider]  polyethylene glycol powder (GLYCOLAX/MIRALAX) powder Take 17 g by mouth daily as needed (for constipation.).    [provider]  Specialty Vitamins Products (LIPOTRIAD VISION SUPPORT PO) Take 1 capsule by mouth daily. Clinical Strength Vision Support    [provider]    Allergies    Tramadol, Morphine and related, Penicillins, Tetracyclines & related, Meclizine, Zantac  [ranitidine hcl], Hydrocodone-acetaminophen, and Sitagliptin  Review of Systems   Review of Systems  Unable to perform ROS: Dementia  Constitutional: Negative for fever.  HENT: Negative for congestion.   Respiratory: Negative for cough and shortness of breath.   Cardiovascular: Negative for leg swelling.  Musculoskeletal: Negative for back pain and neck pain.  Skin: Negative for  rash.  Neurological: Negative for weakness.  Hematological: Does not bruise/bleed easily.  Psychiatric/Behavioral: Positive for agitation and confusion.    Physical Exam Updated Vital Signs BP (!) 135/121   Pulse 65   Resp 17   SpO2 97%   Physical Exam Vitals and nursing note reviewed.  Constitutional:      General: She is not in acute distress.    Appearance: Normal appearance. She is well-developed.  HENT:     Head: Normocephalic and atraumatic.  Eyes:     Extraocular Movements: Extraocular movements intact.     Conjunctiva/sclera: Conjunctivae normal.     Pupils: Pupils are equal, round, and reactive to light.  Cardiovascular:     Rate and Rhythm: Normal rate and regular rhythm.     Heart sounds: No murmur.  Pulmonary:     Effort: Pulmonary effort is normal. No respiratory distress.     Breath sounds: Normal breath sounds.  Abdominal:     Palpations: Abdomen is soft.     Tenderness: There is no abdominal tenderness.  Musculoskeletal:        General: Normal range of motion.     Cervical back: Normal range of motion and neck supple.  Skin:    General: Skin is warm and dry.     Capillary Refill: Capillary refill takes less than 2 seconds.  Neurological:     General: No focal deficit present.     Mental Status: She is alert. Mental status is at baseline.     ED Results / Procedures / Treatments   Labs (all labs ordered are listed, but only abnormal results are displayed) Labs Reviewed  COMPREHENSIVE METABOLIC PANEL - Abnormal; Notable for the following components:      Result Value   Glucose, Bld 137 (*)    Creatinine, Ser 1.03 (*)    Calcium 10.5 (*)    GFR calc non Af Amer 50 (*)    GFR calc Af Amer 58 (*)    All other components within normal limits  CBC - Abnormal; Notable for the following components:   RBC 3.66 (*)  All other components within normal limits    EKG EKG Interpretation  Date/Time:  Thursday December 02 2019 11:23:09 EST Ventricular  Rate:  57 PR Interval:    QRS Duration: 94 QT Interval:  406 QTC Calculation: 396 R Axis:   45 Text Interpretation: Sinus rhythm Probable left atrial enlargement Sinus bradycardia Confirmed by Fredia Sorrow [phone]) on 12/02/2019 11:25:56 AM   Radiology No results found.  Procedures Procedures (including critical care time)  Medications Ordered in ED Medications  sodium chloride flush (NS) 0.9 % injection 3 mL (3 mLs Intravenous Given 12/02/19 1133)  ALPRAZolam (XANAX) tablet 0.25 mg (0.25 mg Oral Given 12/02/19 1250)    ED Course  I have reviewed the triage vital signs and the nursing notes.  Pertinent labs & imaging results that were available during my care of the patient were reviewed by me and considered in my medical decision making (see chart for details).    MDM Rules/Calculators/A&P                      Labs without any significant abnormalities.  Patient is actually been quite active here.  Was given her Xanax dose it looks like she takes 3 times a day.  Probably did not have her morning dose.  Patient did settle down some with that.  Vital signs without any acute abnormalities.  Best we can tell patient is kind of baseline on her current mental status.  Does have a history of dementia.  Certainly not lethargic and not unresponsive.  Patient stable for discharge charge back to nursing facility. Final Clinical Impression(s) / ED Diagnoses Final diagnoses:  Altered mental status, unspecified altered mental status type    Rx / DC Orders ED Discharge Orders    None       Fredia Sorrow, MD 12/02/19 1407

## 2019-12-02 NOTE — ED Notes (Signed)
Pt dc'd home w/all belongings, alert to self only which is pt's norm, transported to HG via [facility]

## 2019-12-02 NOTE — ED Triage Notes (Signed)
Pt arrives via [facility] for AMS and bradycardic. She was walked from her bed to the commentary area 0950, called EMS 1015 for unresponsiveness and lethargic.  EMS reports pt was not responding on their arrival. Pt immediately woke up when EMS started IV. Pt usually ambulatory with a walker, staff reports she doesn't always use her walker.   Pt ambulatory to bathroom on arrival with 2 person assist   20g RAC  152/98 62 16 99% RA 97.7 temp CBG 118

## 2019-12-02 NOTE — Discharge Instructions (Addendum)
Patient work-up here in the emergency department I significant findings.  Patient seems to be back to baseline.  Patient active.  Patient given a dose of Xanax.  Patient stable for discharge back to nursing facility.

## 2019-12-04 DIAGNOSIS — E119 Type 2 diabetes mellitus without complications: Secondary | ICD-10-CM | POA: Diagnosis not present

## 2019-12-25 ENCOUNTER — Emergency Department (HOSPITAL_COMMUNITY)
Admission: EM | Admit: 2019-12-25 | Discharge: 2019-12-25 | Disposition: A | Discharge status: Home / Self Care | Payer: Medicare Other | Attending: Emergency Medicine | Admitting: Emergency Medicine

## 2019-12-25 ENCOUNTER — Encounter (HOSPITAL_COMMUNITY): Payer: Self-pay | Admitting: Emergency Medicine

## 2019-12-25 ENCOUNTER — Other Ambulatory Visit: Payer: Self-pay

## 2019-12-25 ENCOUNTER — Emergency Department (HOSPITAL_COMMUNITY): Payer: Medicare Other

## 2019-12-25 DIAGNOSIS — Z87891 Personal history of nicotine dependence: Secondary | ICD-10-CM | POA: Diagnosis not present

## 2019-12-25 DIAGNOSIS — R404 Transient alteration of awareness: Secondary | ICD-10-CM | POA: Diagnosis not present

## 2019-12-25 DIAGNOSIS — Z743 Need for continuous supervision: Secondary | ICD-10-CM | POA: Diagnosis not present

## 2019-12-25 DIAGNOSIS — Z7982 Long term (current) use of aspirin: Secondary | ICD-10-CM | POA: Insufficient documentation

## 2019-12-25 DIAGNOSIS — Z923 Personal history of irradiation: Secondary | ICD-10-CM | POA: Diagnosis not present

## 2019-12-25 DIAGNOSIS — Z79899 Other long term (current) drug therapy: Secondary | ICD-10-CM | POA: Diagnosis not present

## 2019-12-25 DIAGNOSIS — I129 Hypertensive chronic kidney disease with stage 1 through stage 4 chronic kidney disease, or unspecified chronic kidney disease: Secondary | ICD-10-CM | POA: Insufficient documentation

## 2019-12-25 DIAGNOSIS — R531 Weakness: Secondary | ICD-10-CM | POA: Diagnosis not present

## 2019-12-25 DIAGNOSIS — Z853 Personal history of malignant neoplasm of breast: Secondary | ICD-10-CM | POA: Insufficient documentation

## 2019-12-25 DIAGNOSIS — R0902 Hypoxemia: Secondary | ICD-10-CM | POA: Diagnosis not present

## 2019-12-25 DIAGNOSIS — N182 Chronic kidney disease, stage 2 (mild): Secondary | ICD-10-CM | POA: Diagnosis not present

## 2019-12-25 DIAGNOSIS — Z8673 Personal history of transient ischemic attack (TIA), and cerebral infarction without residual deficits: Secondary | ICD-10-CM | POA: Diagnosis not present

## 2019-12-25 DIAGNOSIS — R4182 Altered mental status, unspecified: Secondary | ICD-10-CM | POA: Diagnosis not present

## 2019-12-25 DIAGNOSIS — E1122 Type 2 diabetes mellitus with diabetic chronic kidney disease: Secondary | ICD-10-CM | POA: Diagnosis not present

## 2019-12-25 DIAGNOSIS — I10 Essential (primary) hypertension: Secondary | ICD-10-CM | POA: Diagnosis not present

## 2019-12-25 DIAGNOSIS — E039 Hypothyroidism, unspecified: Secondary | ICD-10-CM | POA: Diagnosis not present

## 2019-12-25 DIAGNOSIS — Z7984 Long term (current) use of oral hypoglycemic drugs: Secondary | ICD-10-CM | POA: Insufficient documentation

## 2019-12-25 DIAGNOSIS — R279 Unspecified lack of coordination: Secondary | ICD-10-CM | POA: Diagnosis not present

## 2019-12-25 LAB — COMPREHENSIVE METABOLIC PANEL
ALT: 18 U/L (ref 0–44)
AST: 20 U/L (ref 15–41)
Albumin: 3.6 g/dL (ref 3.5–5.0)
Alkaline Phosphatase: 59 U/L (ref 38–126)
Anion gap: 9 (ref 5–15)
BUN: 22 mg/dL (ref 8–23)
CO2: 24 mmol/L (ref 22–32)
Calcium: 9.1 mg/dL (ref 8.9–10.3)
Chloride: 106 mmol/L (ref 98–111)
Creatinine, Ser: 0.89 mg/dL (ref 0.44–1.00)
GFR calc Af Amer: >60 mL/min (ref >60)
GFR calc non Af Amer: 60 mL/min — ABNORMAL LOW (ref >60)
Glucose, Bld: 123 mg/dL — ABNORMAL HIGH (ref 70–99)
Potassium: 4.1 mmol/L (ref 3.5–5.1)
Sodium: 139 mmol/L (ref 135–145)
Total Bilirubin: 0.7 mg/dL (ref 0.3–1.2)
Total Protein: 6.3 g/dL — ABNORMAL LOW (ref 6.5–8.1)

## 2019-12-25 LAB — URINALYSIS, ROUTINE W REFLEX MICROSCOPIC
Bilirubin Urine: NEGATIVE
Glucose, UA: NEGATIVE mg/dL
Hgb urine dipstick: NEGATIVE
Ketones, ur: NEGATIVE mg/dL
Nitrite: NEGATIVE
Protein, ur: 30 mg/dL — AB
Specific Gravity, Urine: 1.008 (ref 1.005–1.030)
pH: 6 (ref 5.0–8.0)

## 2019-12-25 LAB — CBC
HCT: 35.5 % — ABNORMAL LOW (ref 36.0–46.0)
Hemoglobin: 11.6 g/dL — ABNORMAL LOW (ref 12.0–15.0)
MCH: 33.2 pg (ref 26.0–34.0)
MCHC: 32.7 g/dL (ref 30.0–36.0)
MCV: 101.7 fL — ABNORMAL HIGH (ref 80.0–100.0)
Platelets: 239 10*3/uL (ref 150–400)
RBC: 3.49 MIL/uL — ABNORMAL LOW (ref 3.87–5.11)
RDW: 13 % (ref 11.5–15.5)
WBC: 5.6 10*3/uL (ref 4.0–10.5)
nRBC: 0 % (ref 0.0–0.2)

## 2019-12-25 MED ORDER — LORAZEPAM 2 MG/ML IJ SOLN
0.2500 mg | Freq: Once | INTRAMUSCULAR | Status: AC
  Administered 2019-12-25: 0.25 mg via INTRAVENOUS
  Filled 2019-12-25: qty 1

## 2019-12-25 NOTE — Discharge Instructions (Addendum)
Please follow-up patient's blood pressure in outpatient setting.  She was hypertensive in the emergency department, but has also not had her morning blood pressure medications and has been agitated here.  No other acute abnormality was found and she appears to be at her baseline here.

## 2019-12-25 NOTE — ED Notes (Signed)
All appropriate discharge materials reviewed at length with patient. Time for questions provided. Pt has no other questions at this time and verbalizes understanding of all provided materials.  

## 2019-12-25 NOTE — ED Notes (Signed)
Pt not listening. Brought pt a chair to sit in. Pt wants to sit at bedside, not listening and need to be frequently re-directed.

## 2019-12-25 NOTE — ED Notes (Signed)
Pt being defiant, not listenting and cannot be left alone in the room. Pt brought to the nurses station for close observation.

## 2019-12-25 NOTE — ED Triage Notes (Signed)
Per [facility] pt coming from [facility] for "dazed look" this am and not as alert as usual. Staff reports baseline of alert to self and ambulatory. Patient did not receive morning medicine from staff. Patient denies any pain. Able to state her name and that she is at the hospital.

## 2019-12-25 NOTE — ED Notes (Signed)
Pt very confused, attempting to get out of bed, taking 4 staff members to help pt back into bed due to resistance. Pt states she needs to urinate, bedside commode placed in room, yellow slip resistant socks applied and pt assisted to bedside commode. Pt refused to wear a gown so pts clothing put back on. Pt sitting in chair at this time, refuses to lay in bed.

## 2019-12-25 NOTE — ED Provider Notes (Signed)
[facility] EMERGENCY DEPARTMENT Provider Note   CSN: [id number] Arrival date & time: 12/25/19  [id number]     History Chief Complaint  Patient presents with  . Altered Mental Status    [name] is a 86 y.o. female.  HPI  86 year old female history of mild dementia, type 2 diabetes presents today from memory care unit with reports that she had an episode of staring into space with decreased responsiveness.  Upon EMS arrival she appeared to be at her baseline.  EMS checked blood sugar at bedside on their arrival and it was 150.  She has been hypertensive but otherwise they reported no abnormal prehospital vital signs or exam.  Here she is awake and alert.  She reports that this morning it was "a weird.  She is unable to give [place] in the much more history.  She has no complaints at this time.  She denies headache, sore throat, chest pain, shortness of breath, abdominal pain, extremity pain or injury.     Past Medical History:  Diagnosis Date  . Breast cancer ([place])   . Cancer [facility])    breast cancer on left - 10  years ago  . Depression   . Diabetes mellitus without complication ([place])    type 2  . Family history of adverse reaction to anesthesia    Sister gets sick  . GERD (gastroesophageal reflux disease)   . Heart murmur    as a child  . Hypertension   . Hypothyroidism   . Personal history of radiation therapy 2010  . PONV (postoperative nausea and vomiting)   . Wears glasses     Patient Active Problem List   Diagnosis Date Noted  . Arthritis 01/08/2019  . Carpal tunnel syndrome on left 01/08/2019  . Colon cancer screening 01/08/2019  . Early satiety 01/08/2019  . Late effect of stroke 01/08/2019  . Moderate single current episode of major depressive disorder ([place]) 01/08/2019  . Pure hypercholesterolemia 01/08/2019  . Non-intractable vomiting with nausea   . Concussion 11/22/2016  . Post concussive syndrome 11/21/2016  . Post concussion syndrome  11/21/2016  . Hematoma of scalp   . Generalized anxiety disorder 10/23/2016  . Severe episode of recurrent major depressive disorder, without psychotic features ([place])   . Reactive depression   . Debilitated 10/09/2016  . Transient cerebral ischemia   . Anxiety state   . Controlled type 2 diabetes mellitus with complication, without long-term current use of insulin ([place])   . History of breast cancer   . Acute blood loss anemia   . Stage 2 chronic kidney disease   . Syncope 10/07/2016  . Stroke ([place]) 10/06/2016  . Stroke (cerebrum) ([place]) 10/06/2016  . Aphasia 10/06/2016  . Leukocytosis 10/06/2016  . Essential hypertension 10/06/2016  . Hypothyroidism 10/06/2016  . GERD (gastroesophageal reflux disease) 10/06/2016  . Hyperlipemia 10/06/2016  . Stenosing tenosynovitis 07/12/2015    Past Surgical History:  Procedure Laterality Date  . ABDOMINAL HYSTERECTOMY    . APPENDECTOMY    . ARCUATE KERATECTOMY    . BREAST LUMPECTOMY Left 2010/ 2018   Dr. Dalbert Batman for 2018 surgery  . BREAST LUMPECTOMY Left 07/18/2017   Procedure: LEFT BREAST LUMPECTOMY;  Surgeon: Fanny Skates, MD;  [facility];  Service: General;  Laterality: Left;  . BUNIONECTOMY     right  . CARPAL TUNNEL RELEASE     right  . CARPAL TUNNEL RELEASE Left 05/24/2014   Procedure: LEFT CARPAL TUNNEL RELEASE LEFT THUMB  TRIGGER FINGER RELEASE;  Surgeon: Cammie Sickle, MD;  Location: [facility];  Service: Orthopedics;  Laterality: Left;  left thumb also site of incision  . DEBRIDEMENT OF ABDOMINAL WALL ABSCESS  1978  . OVARIAN CYST SURGERY     x2-  . TONSILLECTOMY    . TRIGGER FINGER RELEASE Left 01/25/2016   Procedure: RELEASE A-1 PULLEY LEFT RING FINGER, LEFT SMALL FINGER;  Surgeon: Daryll Brod, MD;  Location: [facility];  Service: Orthopedics;  Laterality: Left;  ANESTHESIA: IV REGIONAL FAB     OB History   No obstetric history on file.     Family History  Problem Relation Age of  Onset  . Hypertension Other     Social History   Tobacco Use  . Smoking status: Former Smoker    Types: Cigarettes  . Smokeless tobacco: Never Used  . Tobacco comment: Quit smoking  30 years agl  Substance Use Topics  . Alcohol use: No    Alcohol/week: 0.0 standard drinks  . Drug use: No    Home Medications Prior to Admission medications   Medication Sig Start Date End Date Taking? Authorizing Provider  acetaminophen (TYLENOL) 500 MG tablet Take 500 mg by mouth every 6 (six) hours as needed (for pain. (typically before bed)).   Yes [provider]  aspirin EC 81 MG tablet Take 81 mg by mouth at bedtime.   Yes [provider]  atorvastatin (LIPITOR) 40 MG tablet Take 1 tablet (40 mg total) by mouth daily at 6 PM. Patient taking differently: Take 40 mg by mouth daily.  10/09/16  Yes Vann, Jessica U, DO  bismuth subsalicylate (STOMACH RELIEF) 262 MG/15ML suspension Take 30 mLs by mouth 2 (two) times daily as needed for indigestion or diarrhea or loose stools.   Yes [provider]  busPIRone (BUSPAR) 10 MG tablet Take 10 mg by mouth 2 (two) times daily.  12/28/18  Yes [provider]  Calcium Carbonate-Vitamin D3 (CALCIUM + VITAMIN D3) 600-400 MG-UNIT TABS Take 1 tablet by mouth 2 (two) times daily.   Yes [provider]  felodipine (PLENDIL) 5 MG 24 hr tablet Take 5 mg by mouth daily.   Yes [provider]  levothyroxine (SYNTHROID) 50 MCG tablet Take 50 mcg by mouth daily before breakfast.   Yes [provider]  losartan (COZAAR) 100 MG tablet Take 1 tablet (100 mg total) by mouth daily. 10/16/16  Yes Love, Ivan Anchors, PA-C  Melatonin 5 MG TABS Take 5 mg by mouth at bedtime as needed (for sleep.).    Yes [provider]  metFORMIN (GLUCOPHAGE-XR) 500 MG 24 hr tablet Take 500 mg by mouth every evening. 05/09/17  Yes [provider]  Multiple Vitamins-Minerals (PRESERVISION AREDS 2 PO) Take 1 tablet by mouth 2  (two) times daily.    Yes [provider]    Allergies    Tramadol, Morphine and related, Penicillins, Tetracyclines & related, Meclizine, Zantac  [ranitidine hcl], Hydrocodone-acetaminophen, and Sitagliptin  Review of Systems   Review of Systems  All other systems reviewed and are negative.   Physical Exam Updated Vital Signs BP (!) 211/62 (BP Location: Right Arm)   Pulse (!) 47   Temp 98 F (36.7 C) (Oral)   Resp 16   SpO2 100%   Physical Exam Vitals and nursing note reviewed.  Constitutional:      Appearance: Normal appearance.  HENT:     Head: Normocephalic.  Right Ear: External ear normal.     Left Ear: External ear normal.     Nose: Nose normal.     Mouth/Throat:     Mouth: Mucous membranes are moist.  Eyes:     Extraocular Movements: Extraocular movements intact.     Pupils: Pupils are equal, round, and reactive to light.  Cardiovascular:     Rate and Rhythm: Normal rate and regular rhythm.     Pulses: Normal pulses.  Pulmonary:     Effort: Pulmonary effort is normal.  Abdominal:     General: Abdomen is flat.  Musculoskeletal:        General: Normal range of motion.     Cervical back: Normal range of motion.  Skin:    General: Skin is warm and dry.     Capillary Refill: Capillary refill takes less than 2 seconds.  Neurological:     General: No focal deficit present.     Mental Status: She is alert. Mental status is at baseline. She is disoriented.     Cranial Nerves: No cranial nerve deficit.     Sensory: No sensory deficit.     Motor: No weakness.  Psychiatric:        Mood and Affect: Mood normal.     ED Results / Procedures / Treatments   Labs (all labs ordered are listed, but only abnormal results are displayed) Labs Reviewed - No data to display  EKG EKG Interpretation  Date/Time:  Saturday December 25 2019 09:46:33 EST Ventricular Rate:  50 PR Interval:    QRS Duration: 95 QT Interval:  430 QTC Calculation: 393 R  Axis:   53 Text Interpretation: Sinus rhythm Baseline wander in lead(s) V1 No significant change since last tracing Confirmed by Pattricia Boss [id number]) on 12/25/2019 11:38:14 AM   Radiology No results found.  Procedures Procedures (including critical care time)  Medications Ordered in ED Medications - No data to display  ED Course  I have reviewed the triage vital signs and the nursing notes.  Pertinent labs & imaging results that were available during my care of the patient were reviewed by me and considered in my medical decision making (see chart for details).    MDM Rules/Calculators/A&P                      86 yo female sent from [facility] for episode of staring with decreased responsiveness, back to baseline.  Patient with baseline dementia.  Here she has been alert and intermittently agitated.  She received 0.25 mg ativan for her agitation.  No focal deficits noted.  Patient is hypertensive here.  Initial systolic blood pressure was 211 is 190 on recheck.  Patient did not receive a.m. labs.  She has been hypertensive in the past.  She is also agitated which is likely contributing to her hypertension.  This will need to be followed back at the facility.  Work up without acute abnormality and appears stable for discharge back to facility.  Final Clinical Impression(s) / ED Diagnoses Final diagnoses:  Transient alteration of awareness  Hypertension, unspecified type    Rx / DC Orders ED Discharge Orders    None       Pattricia Boss, MD 12/25/19 1247

## 2019-12-25 NOTE — ED Notes (Signed)
Report called for this pt.

## 2020-03-04 DIAGNOSIS — E119 Type 2 diabetes mellitus without complications: Secondary | ICD-10-CM | POA: Diagnosis not present

## 2020-03-08 DIAGNOSIS — Z1159 Encounter for screening for other viral diseases: Secondary | ICD-10-CM | POA: Diagnosis not present

## 2020-03-08 DIAGNOSIS — Z20828 Contact with and (suspected) exposure to other viral communicable diseases: Secondary | ICD-10-CM | POA: Diagnosis not present

## 2020-03-15 DIAGNOSIS — Z20828 Contact with and (suspected) exposure to other viral communicable diseases: Secondary | ICD-10-CM | POA: Diagnosis not present

## 2020-03-15 DIAGNOSIS — Z1159 Encounter for screening for other viral diseases: Secondary | ICD-10-CM | POA: Diagnosis not present

## 2020-04-05 DIAGNOSIS — Z1159 Encounter for screening for other viral diseases: Secondary | ICD-10-CM | POA: Diagnosis not present

## 2020-04-05 DIAGNOSIS — Z20828 Contact with and (suspected) exposure to other viral communicable diseases: Secondary | ICD-10-CM | POA: Diagnosis not present

## 2020-04-06 DIAGNOSIS — Z0120 Encounter for dental examination and cleaning without abnormal findings: Secondary | ICD-10-CM | POA: Diagnosis not present

## 2020-04-12 DIAGNOSIS — Z1159 Encounter for screening for other viral diseases: Secondary | ICD-10-CM | POA: Diagnosis not present

## 2020-04-12 DIAGNOSIS — Z20828 Contact with and (suspected) exposure to other viral communicable diseases: Secondary | ICD-10-CM | POA: Diagnosis not present

## 2020-04-19 DIAGNOSIS — Z1159 Encounter for screening for other viral diseases: Secondary | ICD-10-CM | POA: Diagnosis not present

## 2020-04-19 DIAGNOSIS — Z20828 Contact with and (suspected) exposure to other viral communicable diseases: Secondary | ICD-10-CM | POA: Diagnosis not present

## 2020-06-07 DIAGNOSIS — Z1159 Encounter for screening for other viral diseases: Secondary | ICD-10-CM | POA: Diagnosis not present

## 2020-06-07 DIAGNOSIS — Z20828 Contact with and (suspected) exposure to other viral communicable diseases: Secondary | ICD-10-CM | POA: Diagnosis not present

## 2020-06-19 ENCOUNTER — Non-Acute Institutional Stay: Payer: Medicare Other | Admitting: Internal Medicine

## 2020-06-19 DIAGNOSIS — F0151 Vascular dementia with behavioral disturbance: Secondary | ICD-10-CM | POA: Diagnosis not present

## 2020-06-19 DIAGNOSIS — Z515 Encounter for palliative care: Secondary | ICD-10-CM

## 2020-06-19 DIAGNOSIS — F01518 Vascular dementia, unspecified severity, with other behavioral disturbance: Secondary | ICD-10-CM

## 2020-06-20 ENCOUNTER — Other Ambulatory Visit: Payer: Self-pay

## 2020-06-20 ENCOUNTER — Encounter: Payer: Self-pay | Admitting: Internal Medicine

## 2020-06-20 NOTE — Progress Notes (Signed)
[facility] Consult Note Telephone: [phone]  Fax: [phone]  PATIENT NAME: [name] DOB: [birth year] MRN: [id number]  PRIMARY CARE PROVIDER:   Lajean Manes, MD  REFERRING PROVIDER:  Lajean Manes, MD [address] [place],  [place] [address]  RESPONSIBLE PARTYMeta Hatchet [phone]  [phone]         RECOMMENDATIONS and PLAN:  Palliative Care Encounter  Z51.5  1.  Advance care planning:  Unable to explain palliative care or discuss advance directives or goals of care with patient due to significant cognitive decline.  Plan on communication with POA for this and all discussions related to patient.  Palliative care will follow-up with patient in aprox 2-3 weeks.   2.  Vascular dementia with behavioral disturbances:  Continue use of Buspar 5mg  BID to attempt to minimize agitation. Attempt involvement in facility activities. Continue to monitor for possible need of additional therapies if agitation escalates.  3.  Fall risk:  Monitor fitting of shoes and alertness.  Fall risk prevention and monitoring by staff members.   I spent 30 minutes providing this consultation,  from 1400to 1430. More than 50% of the time in this consultation was spent coordinating communication with patient and clinical staff.  HISTORY OF PRESENT ILLNESS:  [name] is a 86 y.o. year old female with multiple medical problems including vascular dementia with behavior disturbances/agitation, wandering behaviors,  chronic microvascular ischemia per MRI report in 2019, T2DM.  Medical record review notes that patient has had several falls and episodes of uncontrolled hypertension.  She refuses to take medications at times.  Palliative Care was asked to help address goals of care.   CODE STATUS: FULL CODE ,  PPS: 50% HOSPICE ELIGIBILITY/DIAGNOSIS: TBD  PAST MEDICAL HISTORY:  Past Medical History:  Diagnosis Date   . Breast cancer ([place])   . Cancer [facility])    breast cancer on left - 10  years ago  . Depression   . Diabetes mellitus without complication ([place])    type 2  . Family history of adverse reaction to anesthesia    Sister gets sick  . GERD (gastroesophageal reflux disease)   . Heart murmur    as a child  . Hypertension   . Hypothyroidism   . Personal history of radiation therapy 2010  . PONV (postoperative nausea and vomiting)   . Wears glasses     ALLERGIES:  Allergies  Allergen Reactions  . Tramadol Anaphylaxis  . Morphine And Related Nausea And Vomiting  . Penicillins Swelling    Has patient had a PCN reaction causing immediate rash, facial/tongue/throat swelling, SOB or lightheadedness with hypotension: Yes Has patient had a PCN reaction causing severe rash involving mucus membranes or skin necrosis: Unknown Has patient had a PCN reaction that required hospitalization: No Has patient had a PCN reaction occurring within the last 10 years: No If all of the above answers are "NO", then may proceed with Cephalosporin use.   . Tetracyclines & Related Swelling    Face and eyes swells  . Meclizine Nausea Only  . Zantac  [Ranitidine Hcl] Nausea And Vomiting  . Hydrocodone-Acetaminophen Nausea And Vomiting  . Sitagliptin Nausea And Vomiting and Other (See Comments)     PERTINENT MEDICATIONS:  Outpatient Encounter Medications as of 06/19/2020  Medication Sig  . acetaminophen (TYLENOL) 500 MG tablet Take 500 mg by mouth every 6 (six) hours as needed (for pain. (typically before bed)).  Marland Kitchen  aspirin EC 81 MG tablet Take 81 mg by mouth at bedtime.  Marland Kitchen atorvastatin (LIPITOR) 40 MG tablet Take 1 tablet (40 mg total) by mouth daily at 6 PM. (Patient taking differently: Take 40 mg by mouth daily. )  . bismuth subsalicylate (STOMACH RELIEF) 262 MG/15ML suspension Take 30 mLs by mouth 2 (two) times daily as needed for indigestion or diarrhea or loose stools.  . busPIRone (BUSPAR) 10 MG tablet Take  10 mg by mouth 2 (two) times daily.   . Calcium Carbonate-Vitamin D3 (CALCIUM + VITAMIN D3) 600-400 MG-UNIT TABS Take 1 tablet by mouth 2 (two) times daily.  . felodipine (PLENDIL) 5 MG 24 hr tablet Take 5 mg by mouth daily.  Marland Kitchen levothyroxine (SYNTHROID) 50 MCG tablet Take 50 mcg by mouth daily before breakfast.  . losartan (COZAAR) 100 MG tablet Take 1 tablet (100 mg total) by mouth daily.  . Melatonin 5 MG TABS Take 5 mg by mouth at bedtime as needed (for sleep.).   Marland Kitchen metFORMIN (GLUCOPHAGE-XR) 500 MG 24 hr tablet Take 500 mg by mouth every evening.  . Multiple Vitamins-Minerals (PRESERVISION AREDS 2 PO) Take 1 tablet by mouth 2 (two) times daily.    No facility-administered encounter medications on file as of 06/19/2020.    PHYSICAL EXAM:   General: NAD, frail appearing, thin, walking about throughout memory care facility Pulmonary: clear increased respiratory effort Abdomen: no distention GU: no suprapubic tenderness Extremities: no edema Skin: exposed skin is intact Neurological: Alert and oriented to self only.Speaks in word salad and does not follow commands. Requesting this provider to "go away" and did not want to cooperate with visit  Gonzella Lex, NP-C

## 2020-07-12 ENCOUNTER — Non-Acute Institutional Stay: Payer: Medicare Other | Admitting: Internal Medicine

## 2020-07-12 DIAGNOSIS — F0151 Vascular dementia with behavioral disturbance: Secondary | ICD-10-CM

## 2020-07-12 DIAGNOSIS — Z515 Encounter for palliative care: Secondary | ICD-10-CM

## 2020-07-12 DIAGNOSIS — F01518 Vascular dementia, unspecified severity, with other behavioral disturbance: Secondary | ICD-10-CM

## 2020-07-12 NOTE — Progress Notes (Signed)
[name] Consult Note Telephone: [phone]  Fax: [phone]  PATIENT NAME: [name] DOB: [birth year] MRN: [id number]  PRIMARY CARE PROVIDER:   Lajean Manes, MD  REFERRING PROVIDER:  Lajean Manes, MD [address] [place],  [place] [address]  RESPONSIBLE PARTYMeta Hatchet [phone]  [phone]         RECOMMENDATIONS and PLAN:  Palliative Care Encounter  Z51.5  1.  Advance care planning:  Explanation of palliative and hospice care to sister/POA [place] via phone. Her goals for pt are for safety, provide for personal needs and promote quality of life in a memory care environment.  Advanced directives reviewed with delegations of DNAR/DNI, comfort measures only without return to the hospital, antibiotics if indicated and no tube feedings.  MOST and DNR forms were completed, uploaded to Epic and placed in pt's chart. Clinical staff notified of same.   Palliative care will follow-up with patient in aprox 3-4 weeks   2.  Vascular dementia with behavioral disturbances:  FAST stage 7. Managed with use of Buspar 10 mg BID. Attempt involvement in facility activities. Continue to monitor for possible need of additional therapies if agitation escalates.  3.  Fall risk: Unchanged.   Fall risk prevention and monitoring by staff members.   4. Weight loss:  Continue normal and comfort feedings.  Offer assistance with feedings as needed.  Offer nutritional supplement 1-2x/day.  No desire for workup per sister/POA.  Monitor  I spent 30 minutes providing this consultation,  from 1200to 1230. More than 50% of the time in this consultation was spent coordinating communication with patient and clinical staff.  HISTORY OF PRESENT ILLNESS: Follow-up with [name],  an 86 y.o. year old female with multiple medical problems including vascular dementia with behavior disturbances/agitation, wandering  behaviors,  chronic microvascular ischemia per MRI report in 2019, T2DM. Clinical staff reports no recent falls but pt still exhibits uncooperative behaviors normally during times of assistance with ADLs.   She refuses to take medications at times. Aprox 8# weight loss sinceFeb 2021. Phone conversation with sister [name] who states that pt. Intermittently recognizes her.   Palliative Care was asked to help address goals of care.   CODE STATUS: DNAR/DNI ,  PPS: 50% HOSPICE ELIGIBILITY/DIAGNOSIS: TBD  PAST MEDICAL HISTORY:  Past Medical History:  Diagnosis Date  . Breast cancer ([place])   . Cancer [facility])    breast cancer on left - 10  years ago  . Depression   . Diabetes mellitus without complication ([place])    type 2  . Family history of adverse reaction to anesthesia    Sister gets sick  . GERD (gastroesophageal reflux disease)   . Heart murmur    as a child  . Hypertension   . Hypothyroidism   . Personal history of radiation therapy 2010  . PONV (postoperative nausea and vomiting)   . Wears glasses     ALLERGIES:  Allergies  Allergen Reactions  . Tramadol Anaphylaxis  . Morphine And Related Nausea And Vomiting  . Penicillins Swelling    Has patient had a PCN reaction causing immediate rash, facial/tongue/throat swelling, SOB or lightheadedness with hypotension: Yes Has patient had a PCN reaction causing severe rash involving mucus membranes or skin necrosis: Unknown Has patient had a PCN reaction that required hospitalization: No Has patient had a PCN reaction occurring within the last 10 years: No If all of the above  answers are "NO", then may proceed with Cephalosporin use.   . Tetracyclines & Related Swelling    Face and eyes swells  . Meclizine Nausea Only  . Zantac  [Ranitidine Hcl] Nausea And Vomiting  . Hydrocodone-Acetaminophen Nausea And Vomiting  . Sitagliptin Nausea And Vomiting and Other (See Comments)     PERTINENT MEDICATIONS:  Outpatient Encounter Medications  as of 06/19/2020  Medication Sig  . acetaminophen (TYLENOL) 500 MG tablet Take 500 mg by mouth every 6 (six) hours as needed (for pain. (typically before bed)).  Marland Kitchen aspirin EC 81 MG tablet Take 81 mg by mouth at bedtime.  Marland Kitchen atorvastatin (LIPITOR) 40 MG tablet Take 1 tablet (40 mg total) by mouth daily at 6 PM. (Patient taking differently: Take 40 mg by mouth daily. )  . bismuth subsalicylate (STOMACH RELIEF) 262 MG/15ML suspension Take 30 mLs by mouth 2 (two) times daily as needed for indigestion or diarrhea or loose stools.  . busPIRone (BUSPAR) 10 MG tablet Take 10 mg by mouth 2 (two) times daily.   . Calcium Carbonate-Vitamin D3 (CALCIUM + VITAMIN D3) 600-400 MG-UNIT TABS Take 1 tablet by mouth 2 (two) times daily.  . felodipine (PLENDIL) 5 MG 24 hr tablet Take 5 mg by mouth daily.  Marland Kitchen levothyroxine (SYNTHROID) 50 MCG tablet Take 50 mcg by mouth daily before breakfast.  . losartan (COZAAR) 100 MG tablet Take 1 tablet (100 mg total) by mouth daily.  . Melatonin 5 MG TABS Take 5 mg by mouth at bedtime as needed (for sleep.).   Marland Kitchen metFORMIN (GLUCOPHAGE-XR) 500 MG 24 hr tablet Take 500 mg by mouth every evening.  . Multiple Vitamins-Minerals (PRESERVISION AREDS 2 PO) Take 1 tablet by mouth 2 (two) times daily.    No facility-administered encounter medications on file as of 06/19/2020.    PHYSICAL EXAM:   General: NAD, frail appearing, thin, feeding self at dining room table Pulmonary: no increase of respiratory effort Abdomen: no distention Extremities: no edema Skin: exposed skin is intact Neurological: Alert and non-verbal, unable to determine orientation due to degree of cognitive decline Psych:  Pleasant mood  Gonzella Lex, NP-C

## 2020-07-13 ENCOUNTER — Telehealth: Payer: Self-pay | Admitting: Internal Medicine

## 2020-07-13 ENCOUNTER — Other Ambulatory Visit: Payer: Self-pay

## 2020-07-19 DIAGNOSIS — Z20828 Contact with and (suspected) exposure to other viral communicable diseases: Secondary | ICD-10-CM | POA: Diagnosis not present

## 2020-07-19 DIAGNOSIS — Z1159 Encounter for screening for other viral diseases: Secondary | ICD-10-CM | POA: Diagnosis not present

## 2020-08-09 DIAGNOSIS — Z23 Encounter for immunization: Secondary | ICD-10-CM | POA: Diagnosis not present

## 2020-09-15 DIAGNOSIS — Z1159 Encounter for screening for other viral diseases: Secondary | ICD-10-CM | POA: Diagnosis not present

## 2020-09-15 DIAGNOSIS — Z20828 Contact with and (suspected) exposure to other viral communicable diseases: Secondary | ICD-10-CM | POA: Diagnosis not present

## 2020-10-03 DIAGNOSIS — Z1159 Encounter for screening for other viral diseases: Secondary | ICD-10-CM | POA: Diagnosis not present

## 2020-10-03 DIAGNOSIS — Z20828 Contact with and (suspected) exposure to other viral communicable diseases: Secondary | ICD-10-CM | POA: Diagnosis not present

## 2020-11-16 DIAGNOSIS — Z1159 Encounter for screening for other viral diseases: Secondary | ICD-10-CM | POA: Diagnosis not present

## 2020-11-16 DIAGNOSIS — Z20828 Contact with and (suspected) exposure to other viral communicable diseases: Secondary | ICD-10-CM | POA: Diagnosis not present

## 2020-11-21 DIAGNOSIS — Z20828 Contact with and (suspected) exposure to other viral communicable diseases: Secondary | ICD-10-CM | POA: Diagnosis not present

## 2020-11-21 DIAGNOSIS — Z1159 Encounter for screening for other viral diseases: Secondary | ICD-10-CM | POA: Diagnosis not present

## 2020-12-01 DIAGNOSIS — Z1159 Encounter for screening for other viral diseases: Secondary | ICD-10-CM | POA: Diagnosis not present

## 2020-12-01 DIAGNOSIS — Z20828 Contact with and (suspected) exposure to other viral communicable diseases: Secondary | ICD-10-CM | POA: Diagnosis not present

## 2020-12-12 DIAGNOSIS — Z20828 Contact with and (suspected) exposure to other viral communicable diseases: Secondary | ICD-10-CM | POA: Diagnosis not present

## 2021-01-17 ENCOUNTER — Emergency Department (HOSPITAL_COMMUNITY): Payer: Medicare Other

## 2021-01-17 ENCOUNTER — Other Ambulatory Visit: Payer: Self-pay

## 2021-01-17 ENCOUNTER — Emergency Department (HOSPITAL_COMMUNITY)
Admission: EM | Admit: 2021-01-17 | Discharge: 2021-01-18 | Disposition: A | Discharge status: Home / Self Care | Payer: Medicare Other | Attending: Emergency Medicine | Admitting: Emergency Medicine

## 2021-01-17 DIAGNOSIS — F0390 Unspecified dementia without behavioral disturbance: Secondary | ICD-10-CM | POA: Diagnosis not present

## 2021-01-17 DIAGNOSIS — R0902 Hypoxemia: Secondary | ICD-10-CM | POA: Diagnosis not present

## 2021-01-17 DIAGNOSIS — Z79899 Other long term (current) drug therapy: Secondary | ICD-10-CM | POA: Diagnosis not present

## 2021-01-17 DIAGNOSIS — E039 Hypothyroidism, unspecified: Secondary | ICD-10-CM | POA: Diagnosis not present

## 2021-01-17 DIAGNOSIS — Z7984 Long term (current) use of oral hypoglycemic drugs: Secondary | ICD-10-CM | POA: Diagnosis not present

## 2021-01-17 DIAGNOSIS — Y92129 Unspecified place in nursing home as the place of occurrence of the external cause: Secondary | ICD-10-CM | POA: Insufficient documentation

## 2021-01-17 DIAGNOSIS — S0083XA Contusion of other part of head, initial encounter: Secondary | ICD-10-CM | POA: Insufficient documentation

## 2021-01-17 DIAGNOSIS — Z853 Personal history of malignant neoplasm of breast: Secondary | ICD-10-CM | POA: Insufficient documentation

## 2021-01-17 DIAGNOSIS — Z87891 Personal history of nicotine dependence: Secondary | ICD-10-CM | POA: Diagnosis not present

## 2021-01-17 DIAGNOSIS — N182 Chronic kidney disease, stage 2 (mild): Secondary | ICD-10-CM | POA: Diagnosis not present

## 2021-01-17 DIAGNOSIS — Y9301 Activity, walking, marching and hiking: Secondary | ICD-10-CM | POA: Insufficient documentation

## 2021-01-17 DIAGNOSIS — E1122 Type 2 diabetes mellitus with diabetic chronic kidney disease: Secondary | ICD-10-CM | POA: Diagnosis not present

## 2021-01-17 DIAGNOSIS — I129 Hypertensive chronic kidney disease with stage 1 through stage 4 chronic kidney disease, or unspecified chronic kidney disease: Secondary | ICD-10-CM | POA: Diagnosis not present

## 2021-01-17 DIAGNOSIS — Z7982 Long term (current) use of aspirin: Secondary | ICD-10-CM | POA: Diagnosis not present

## 2021-01-17 DIAGNOSIS — R519 Headache, unspecified: Secondary | ICD-10-CM | POA: Diagnosis not present

## 2021-01-17 DIAGNOSIS — I10 Essential (primary) hypertension: Secondary | ICD-10-CM | POA: Diagnosis not present

## 2021-01-17 DIAGNOSIS — Z043 Encounter for examination and observation following other accident: Secondary | ICD-10-CM | POA: Diagnosis not present

## 2021-01-17 DIAGNOSIS — S0990XA Unspecified injury of head, initial encounter: Secondary | ICD-10-CM | POA: Diagnosis not present

## 2021-01-17 DIAGNOSIS — R220 Localized swelling, mass and lump, head: Secondary | ICD-10-CM | POA: Diagnosis not present

## 2021-01-17 DIAGNOSIS — W19XXXA Unspecified fall, initial encounter: Secondary | ICD-10-CM | POA: Diagnosis not present

## 2021-01-17 MED ORDER — HALOPERIDOL LACTATE 5 MG/ML IJ SOLN
5.0000 mg | Freq: Once | INTRAMUSCULAR | Status: AC
  Administered 2021-01-17: 5 mg via INTRAMUSCULAR
  Filled 2021-01-17: qty 1

## 2021-01-17 NOTE — ED Notes (Signed)
Butch Penny, pts sister given an update.

## 2021-01-17 NOTE — Discharge Instructions (Signed)
Your scans today were overall reassuring. Please return to the ER for any new or worsening symptoms.

## 2021-01-17 NOTE — ED Triage Notes (Signed)
Pt bib EMS from [facility] for a witnessed fall. Pt fell entering a doorway, staff witnessed fall but were not able to assist to floor. Pt has no new injuries from fall. Bruising to face from previous fall.  Hx of dementia  Vitals: BP: 190/99 O2: 98%  RA CBG 234  18G in left AC placed

## 2021-01-17 NOTE — ED Provider Notes (Signed)
[facility] EMERGENCY DEPARTMENT Provider Note   CSN: [id number] Arrival date & time: 01/17/21  2037     History Chief Complaint  Patient presents with  . Fall    [name] is a 87 y.o. female.  HPI  Level 5 caveat 2/2 dementia 87 year old female with history of breast cancer, DM type II, GERD, hypertension, hypothyroidism, dementia presents to the ER from her facility at [facility] after a witnessed fall. I was able to speak with nursing staff who are at the facility who states that the patient was walking, lost her balance and fell backwards hitting her head. There was no LOC. Not on blood thinners. With evidence of bruising to her face from an old fall not related to this current fall. Pt is demented and is at baseline mental status after the fall per facility     Past Medical History:  Diagnosis Date  . Breast cancer ([place])   . Cancer [facility])    breast cancer on left - 10  years ago  . Depression   . Diabetes mellitus without complication ([place])    type 2  . Family history of adverse reaction to anesthesia    Sister gets sick  . GERD (gastroesophageal reflux disease)   . Heart murmur    as a child  . Hypertension   . Hypothyroidism   . Personal history of radiation therapy 2010  . PONV (postoperative nausea and vomiting)   . Wears glasses     Patient Active Problem List   Diagnosis Date Noted  . Arthritis 01/08/2019  . Carpal tunnel syndrome on left 01/08/2019  . Colon cancer screening 01/08/2019  . Early satiety 01/08/2019  . Late effect of stroke 01/08/2019  . Moderate single current episode of major depressive disorder ([place]) 01/08/2019  . Pure hypercholesterolemia 01/08/2019  . Non-intractable vomiting with nausea   . Concussion 11/22/2016  . Post concussive syndrome 11/21/2016  . Post concussion syndrome 11/21/2016  . Hematoma of scalp   . Generalized anxiety disorder 10/23/2016  . Severe episode of recurrent  major depressive disorder, without psychotic features ([place])   . Reactive depression   . Debilitated 10/09/2016  . Transient cerebral ischemia   . Anxiety state   . Controlled type 2 diabetes mellitus with complication, without long-term current use of insulin ([place])   . History of breast cancer   . Acute blood loss anemia   . Stage 2 chronic kidney disease   . Syncope 10/07/2016  . Stroke ([place]) 10/06/2016  . Stroke (cerebrum) ([place]) 10/06/2016  . Aphasia 10/06/2016  . Leukocytosis 10/06/2016  . Essential hypertension 10/06/2016  . Hypothyroidism 10/06/2016  . GERD (gastroesophageal reflux disease) 10/06/2016  . Hyperlipemia 10/06/2016  . Stenosing tenosynovitis 07/12/2015    Past Surgical History:  Procedure Laterality Date  . ABDOMINAL HYSTERECTOMY    . APPENDECTOMY    . ARCUATE KERATECTOMY    . BREAST LUMPECTOMY Left 2010/ 2018   Dr. Dalbert Batman for 2018 surgery  . BREAST LUMPECTOMY Left 07/18/2017   Procedure: LEFT BREAST LUMPECTOMY;  Surgeon: Fanny Skates, MD;  Location: [facility];  Service: General;  Laterality: Left;  . BUNIONECTOMY     right  . CARPAL TUNNEL RELEASE     right  . CARPAL TUNNEL RELEASE Left 05/24/2014   Procedure: LEFT CARPAL TUNNEL RELEASE LEFT THUMB TRIGGER FINGER RELEASE;  Surgeon: Cammie Sickle, MD;  Location: [facility];  Service: Orthopedics;  Laterality: Left;  left thumb also site of incision  . DEBRIDEMENT OF ABDOMINAL WALL ABSCESS  1978  . OVARIAN CYST SURGERY     x2-  . TONSILLECTOMY    . TRIGGER FINGER RELEASE Left 01/25/2016   Procedure: RELEASE A-1 PULLEY LEFT RING FINGER, LEFT SMALL FINGER;  Surgeon: Daryll Brod, MD;  Location: [facility];  Service: Orthopedics;  Laterality: Left;  ANESTHESIA: IV REGIONAL FAB     OB History   No obstetric history on file.     Family History  Problem Relation Age of Onset  . Hypertension Other     Social History   Tobacco Use  . Smoking status: Former Smoker    Types:  Cigarettes  . Smokeless tobacco: Never Used  . Tobacco comment: Quit smoking  30 years agl  Vaping Use  . Vaping Use: Never used  Substance Use Topics  . Alcohol use: No    Alcohol/week: 0.0 standard drinks  . Drug use: No    Home Medications Prior to Admission medications   Medication Sig Start Date End Date Taking? Authorizing Provider  acetaminophen (TYLENOL) 500 MG tablet Take 500 mg by mouth every 6 (six) hours as needed (for pain. (typically before bed)).    [provider]  aspirin EC 81 MG tablet Take 81 mg by mouth at bedtime.    [provider]  atorvastatin (LIPITOR) 40 MG tablet Take 1 tablet (40 mg total) by mouth daily at 6 PM. Patient taking differently: Take 40 mg by mouth daily.  10/09/16   Geradine Girt, DO  bismuth subsalicylate (STOMACH RELIEF) 262 MG/15ML suspension Take 30 mLs by mouth 2 (two) times daily as needed for indigestion or diarrhea or loose stools.    [provider]  busPIRone (BUSPAR) 10 MG tablet Take 10 mg by mouth 2 (two) times daily.  12/28/18   [provider]  Calcium Carbonate-Vitamin D3 (CALCIUM + VITAMIN D3) 600-400 MG-UNIT TABS Take 1 tablet by mouth 2 (two) times daily.    [provider]  felodipine (PLENDIL) 5 MG 24 hr tablet Take 5 mg by mouth daily.    [provider]  levothyroxine (SYNTHROID) 50 MCG tablet Take 50 mcg by mouth daily before breakfast.    [provider]  losartan (COZAAR) 100 MG tablet Take 1 tablet (100 mg total) by mouth daily. 10/16/16   Love, Ivan Anchors, PA-C  Melatonin 5 MG TABS Take 5 mg by mouth at bedtime as needed (for sleep.).     [provider]  metFORMIN (GLUCOPHAGE-XR) 500 MG 24 hr tablet Take 500 mg by mouth every evening. 05/09/17   [provider]  Multiple Vitamins-Minerals (PRESERVISION AREDS 2 PO) Take 1 tablet by mouth 2 (two) times daily.     [provider]    Allergies    Tramadol, Morphine and related,  Penicillins, Tetracyclines & related, Meclizine, Zantac  [ranitidine hcl], Hydrocodone-acetaminophen, and Sitagliptin  Review of Systems   Review of Systems  Unable to perform ROS: Dementia    Physical Exam Updated Vital Signs BP (!) 167/60   Pulse (!) 59   Temp 98.4 F (36.9 C) (Axillary)   Resp 11   SpO2 99%   Physical Exam Vitals and nursing note reviewed.  Constitutional:      General: She is not in acute distress.    Appearance: She is well-developed and well-nourished. She is not ill-appearing or diaphoretic.  HENT:     Head: Normocephalic and atraumatic.  Comments: Evidence of old healed bruising to the maxillofacial area Eyes:     Conjunctiva/sclera: Conjunctivae normal.  Neck:     Comments: No midline tenderness, stepoffs, crepitus Cardiovascular:     Rate and Rhythm: Normal rate and regular rhythm.     Pulses: Normal pulses.     Heart sounds: Normal heart sounds. No murmur heard.   Pulmonary:     Effort: Pulmonary effort is normal. No respiratory distress.     Breath sounds: Normal breath sounds.  Abdominal:     Palpations: Abdomen is soft.     Tenderness: There is no abdominal tenderness. There is no right CVA tenderness or left CVA tenderness.  Musculoskeletal:        General: No swelling, tenderness, deformity, signs of injury or edema. Normal range of motion.     Cervical back: Neck supple.     Right lower leg: No edema.     Left lower leg: No edema.     Comments: Moving all 4 extremities with no difficulty, 5/5 strength, attempting to climb out of bed. No tenderness to the ribs bilaterally, no C, T, L-spine tenderness, no step-offs or crepitus. No evidence of rib deformities, no flail chest, no bruising or erythema to the chest wall  Skin:    General: Skin is warm and dry.     Coloration: Skin is not jaundiced.     Findings: Bruising (Old, healing bruising to the maxillofacial area) present.  Neurological:     General: No focal deficit present.      Mental Status: She is alert.     Motor: No weakness.     Comments: Patient alert, interactive, speaking sometimes responds appropriately but other times has verbal jargon. She is moving all 4 extremities without difficulty, no word slurring, no facial droop. Will follow some commands, but not all   Psychiatric:        Mood and Affect: Mood and affect normal.     ED Results / Procedures / Treatments   Labs (all labs ordered are listed, but only abnormal results are displayed) Labs Reviewed - No data to display  EKG None  Radiology CT Head Wo Contrast  Result Date: 01/17/2021 CLINICAL DATA:  Status post fall. EXAM: CT HEAD WITHOUT CONTRAST TECHNIQUE: Contiguous axial images were obtained from the base of the skull through the vertex without intravenous contrast. COMPARISON:  MR head dated February 14, 2018 FINDINGS: The study is limited secondary to patient motion. Brain: There is mild to moderate severity cerebral atrophy with widening of the extra-axial spaces and ventricular dilatation. There are areas of decreased attenuation within the white matter tracts of the supratentorial brain, consistent with microvascular disease changes. Vascular: No hyperdense vessel or unexpected calcification. Skull: Normal. Negative for fracture or focal lesion. Sinuses/Orbits: No acute finding. Other: Mild left frontal scalp soft tissue swelling is seen. IMPRESSION: 1. Mild left frontal scalp soft tissue swelling. 2. No acute intracranial abnormality. Electronically Signed   By: Virgina Norfolk M.D.   On: 01/17/2021 22:06   CT Cervical Spine Wo Contrast  Result Date: 01/17/2021 CLINICAL DATA:  Status post fall. EXAM: CT CERVICAL SPINE WITHOUT CONTRAST TECHNIQUE: Multidetector CT imaging of the cervical spine was performed without intravenous contrast. Multiplanar CT image reconstructions were also generated. COMPARISON:  November 21, 2016 FINDINGS: Alignment: There is approximately 2 mm anterolisthesis of the  C3 vertebral body on C4. Approximately 1 mm retrolisthesis of the C5 vertebral body is noted on C6. Skull base and vertebrae:  No acute fracture. No primary bone lesion or focal pathologic process. Soft tissues and spinal canal: No prevertebral fluid or swelling. No visible canal hematoma. Disc levels: Moderate to marked severity multilevel endplate sclerosis is seen throughout the cervical spine. This is most prominent at the levels of C3-C4, C5-C6 and C6-C7. Marked severity intervertebral disc space narrowing is seen at the level of C6-C7. Moderate severity intervertebral disc space narrowing is present at the levels of C5-C6 and C7-T1. Bilateral moderate severity multilevel facet joint hypertrophy is noted. Upper chest: Mild linear scarring is seen within the anterior aspect of the left apex. Other: None. IMPRESSION: 1. Moderate to marked severity multilevel degenerative changes without evidence of an acute fracture within the cervical spine. Electronically Signed   By: Virgina Norfolk M.D.   On: 01/17/2021 22:11    Procedures Procedures   Medications Ordered in ED Medications  haloperidol lactate (HALDOL) injection 5 mg (has no administration in time range)    ED Course  I have reviewed the triage vital signs and the nursing notes.  Pertinent labs & imaging results that were available during my care of the patient were reviewed by me and considered in my medical decision making (see chart for details).    MDM Rules/Calculators/A&P                          87 year old female who presents from her facility after what was reported as a mechanical fall. Patient was walking down the hall, reportedly requires a walker but is stubborn and does not use it, lost her balance and fell backwards hitting the back of her head. There was no LOC. She is not anticoagulated. Vitals on arrival overall reassuring, no evidence of fever, tachycardia, hypotension, hypoxia. Patient is reportedly at baseline mental  status per facility, she is alert, interactive, answer some questions appropriately but others will respond with word jargon. This is reportedly normal for her. She has evidence of bruising to her maxillofacial area, which is from an old fall. She has no focal neurologic deficits, however neuro exam is limited given baseline dementia. She has no C-spine tenderness, no tenderness to her rib cage, she is moving all 4 extremities without difficulty, no evidence of leg shortening, internal or external rotation of the hip bilaterally. She has full flexion and extension of the hip with no pain.  CT of the head and neck without any acute abnormalities. Low suspicion for rib fractures or pelvic fracture at this time. Patient is demented at baseline, trying to climb out of the bed.  Low suspicion for stroke, intrathoracic or intra-abdominal injury from the fall. She is not anticoagulated. Will give 5 of Haldol per discussion with Dr. Roslynn Amble. Stable for discharge back to facility.  This was a shared visit with my supervising physician Dr. Roslynn Amble who independently saw and evaluated the patient & provided guidance in evaluation/management/disposition ,in agreement with care  Final Clinical Impression(s) / ED Diagnoses Final diagnoses:  Fall, initial encounter    Rx / DC Orders ED Discharge Orders    None       Lyndel Safe 01/17/21 2256    Lucrezia Starch, MD 01/19/21 [phone]

## 2021-01-18 DIAGNOSIS — R279 Unspecified lack of coordination: Secondary | ICD-10-CM | POA: Diagnosis not present

## 2021-01-18 DIAGNOSIS — W19XXXA Unspecified fall, initial encounter: Secondary | ICD-10-CM | POA: Diagnosis not present

## 2021-01-18 DIAGNOSIS — R5381 Other malaise: Secondary | ICD-10-CM | POA: Diagnosis not present

## 2021-01-18 DIAGNOSIS — Z743 Need for continuous supervision: Secondary | ICD-10-CM | POA: Diagnosis not present

## 2021-01-18 NOTE — ED Notes (Signed)
Pt incontinent of urine, cleaned, dressed, IV removed. Discharged at this time but waiting to transport back to [facility].

## 2021-01-18 NOTE — ED Notes (Signed)
Pt very stimulated by hospital noises and wires connected to her. Pt repeatedly trying to exit the bed. PA made aware.

## 2021-01-18 NOTE — ED Notes (Signed)
Pt incontinent of urine, cleaned, new brief applied and redressed. VSS at care handoff to [facility] for transport back to [facility].

## 2021-02-01 ENCOUNTER — Other Ambulatory Visit: Payer: Self-pay

## 2021-02-01 ENCOUNTER — Non-Acute Institutional Stay: Payer: Medicare Other | Admitting: Nurse Practitioner

## 2021-02-01 DIAGNOSIS — F0391 Unspecified dementia with behavioral disturbance: Secondary | ICD-10-CM

## 2021-02-01 DIAGNOSIS — Z515 Encounter for palliative care: Secondary | ICD-10-CM

## 2021-02-01 NOTE — Progress Notes (Unsigned)
[facility] Consult Note Telephone: [phone]  Fax: [phone]  PATIENT NAME: [name] [id number] (home)  DOB: Jun 25, 1934 MRN: [id number]  PRIMARY CARE PROVIDER:    Lajean Manes, MD,  [place] [address] [phone]  REFERRING PROVIDER:   Lajean Manes, MD [address] [place],  [place] [address] [phone]  RESPONSIBLE PARTY:   Extended Emergency Contact Information Primary Emergency Contact: [name] Address: [address]           [place], [place] [address] [place] Phone: [phone] Mobile Phone: [phone] Relation: Sister Secondary Emergency Contact: [name] [place] Mobile Phone: [phone] Relation: None  I met face to face with patient in facility.   ASSESSMENT AND RECOMMENDATIONS:   Advance Care Planning: Patient unable to participate in discussion. Goal of care: Goal of care is comfort. Family desires her to be comfortable and have no hospitalization if possible. Directives: Patient's code status is DNR, signed DNR and MOST form on chart in facility, copy on [facility] EMR. Details MOST include comfort measures, determine use or limitation of antibiotics, no feeding tube.  Symptom Management:  Dementia with behavior disturbance: FAST 7b. Patient with progressive decline in both cognition and function. Staff report patient severe combativeness with care, often refuses her medications. Has gone from mild aphasia to now unintelligible words. Facility report antipsychotic med has left her too sedated and increased her risk for falls in the past. She had about 7lbs weight loss in the last year, lost 2lbs in the the last month. Patient with frequent falls related to frailty and gait instability. Had 2 falls in the last month. Family does not want any hospitalization for  patient, desires comfort care measures only in the event of repeat fall or acute illness. No report of fever, chills,or  increased SOB. Hospice care and philosophy were discussed and explained in detail to her sister, she verbalized interest in the services Hospice, saying it is inline with their goal of care for patient. Will discuss patient's condition with her PCP [name].Palliative care will continue to provided support to patient, family and medical team.  Follow up Palliative Care Visit: Palliative care will continue to follow for complex decision making and symptom management. Return in about 4-8 weeks or prn.  Family /Caregiver/Community Supports: Patient is a resident of the Memory care unit of [facility]. Her sister is her POA and very involved in her care.  Cognitive / Functional decline: Patient awake and alert, very confused, unintelligible words. Frequently wandering. Ambulates independently, gait unstable.  I spent 46 minutes providing this consultation, time includes time spent with patient, chart review, provider coordination, and documentation. More than 50% of the time in this consultation was spent counseling and coordinating communication.   CHIEF COMPLAINT: Frequent falls  History obtained from review of EMR and discussion with facility staff and patient's family. Records reviewed and summarized bellow.  HISTORY OF PRESENT ILLNESS:  [name] is a 87 y.o. year old female with multiple medical problems including vascular dementia with behavior disturbances/agitation, wandering behaviors, chronic microvascular ischemia per MRI report in 2019, T2DM. Patient with progressive decline in function and cognition, leading to frequent falls. Palliative Care was asked to help address goals of care.  CODE STATUS: DNR  PPS: 40%  HOSPICE ELIGIBILITY/DIAGNOSIS: TBD  PHYSICAL EXAM / ROS:  Weight: 109lbs ,down from  111.4lbs last month, 12f BMI  21.3kg/m2 General:NAD,frail appearing Cardiovascular:no report of chest pain, no edema Pulmonary: no cough, no increased SOB, room air GI:no swallowingissues, appetitepoor,no report of constipation, incontinent of bowel GU:  incontinent of urine MOZH:YQMVHQIONG unstable gait Skin:warm and dry, no rashes noted on exposed skin Neurological: Weakness, confused Psych: non -anxious affect   PAST MEDICAL HISTORY:  Past Medical History:  Diagnosis Date  . Breast cancer ([place]   . [facility] ([facility]    breast cancer on left - 10  years ago  . Depression   . Diabetes mellitus without complication ([place]    type 2  . Family history of adverse reaction to anesthesia    Sister gets sick  . GERD (gastroesophageal reflux disease)   . Heart murmur    as a child  . Hypertension   . Hypothyroidism   . Personal history of radiation therapy 2010  . PONV (postoperative nausea and vomiting)   . Wears glasses     SOCIAL HX:  Social History   Tobacco Use  . Smoking status: Former Smoker    Types: Cigarettes  . Smokeless tobacco: Never Used  . Tobacco comment: Quit smoking  30 years agl  Substance Use Topics  . Alcohol use: No    Alcohol/week: 0.0 standard drinks   FAMILY HX:  Family History  Problem Relation Age of Onset  . Hypertension Other     ALLERGIES:  Allergies  Allergen Reactions  . Tramadol Anaphylaxis  . Morphine And Related Nausea And Vomiting  . Penicillins Swelling    Has patient had a PCN reaction causing immediate rash, facial/tongue/throat swelling, SOB or lightheadedness with hypotension: Yes Has patient had a PCN reaction causing severe rash involving mucus membranes or skin necrosis: Unknown Has patient had a PCN reaction that required hospitalization: No Has patient had a PCN reaction occurring within the last 10 years: No If all of the above answers are "NO", then may proceed with Cephalosporin use.   . Tetracyclines & Related Swelling    Face and eyes  swells  . Meclizine Nausea Only  . Zantac  [Ranitidine Hcl] Nausea And Vomiting  . Hydrocodone-Acetaminophen Nausea And Vomiting  . Sitagliptin Nausea And Vomiting and Other (See Comments)     PERTINENT MEDICATIONS:  Outpatient Encounter Medications as of 02/01/2021  Medication Sig  . acetaminophen (TYLENOL) 500 MG tablet Take 500 mg by mouth every 6 (six) hours as needed (for pain. (typically before bed)).  .[place] Kitchenaspirin EC 81 MG tablet Take 81 mg by mouth at bedtime.  .[place] Kitchenatorvastatin (LIPITOR) 40 MG tablet Take 1 tablet (40 mg total) by mouth daily at 6 PM. (Patient taking differently: Take 40 mg by mouth daily. )  . bismuth subsalicylate (STOMACH RELIEF) 262 MG/15ML suspension Take 30 mLs by mouth 2 (two) times daily as needed for indigestion or diarrhea or loose stools.  . busPIRone (BUSPAR) 10 MG tablet Take 10 mg by mouth 2 (two) times daily.   . Calcium Carbonate-Vitamin D3 (CALCIUM + VITAMIN D3) 600-400 MG-UNIT TABS Take 1 tablet by mouth 2 (two) times daily.  . felodipine (PLENDIL) 5 MG 24 hr tablet Take 5 mg by mouth daily.  .[place] Kitchenlevothyroxine (SYNTHROID) 50 MCG tablet Take 50 mcg by mouth daily before breakfast.  . losartan (COZAAR) 100 MG tablet Take 1 tablet (100 mg total) by mouth daily.  . Melatonin 5 MG TABS Take 5 mg by mouth at bedtime as needed (for sleep.).   .[place] KitchenmetFORMIN (GLUCOPHAGE-XR) 500  MG 24 hr tablet Take 500 mg by mouth every evening.  . Multiple Vitamins-Minerals (PRESERVISION AREDS 2 PO) Take 1 tablet by mouth 2 (two) times daily.    No facility-administered encounter medications on file as of 02/01/2021.    Thank you for the opportunity to participate in the care of Ms. Delton See. The palliative care team will continue to follow. Please call our office at [phone] if we can be of additional assistance.   Jari Favre, DNP, AGPCNP-BC

## 2021-05-25 ENCOUNTER — Other Ambulatory Visit: Payer: Self-pay

## 2021-05-25 ENCOUNTER — Emergency Department (HOSPITAL_COMMUNITY)
Admission: EM | Admit: 2021-05-25 | Discharge: 2021-05-26 | Disposition: A | Discharge status: Home / Self Care | Payer: Medicare Other | Attending: Emergency Medicine | Admitting: Emergency Medicine

## 2021-05-25 ENCOUNTER — Emergency Department (HOSPITAL_COMMUNITY): Payer: Medicare Other

## 2021-05-25 DIAGNOSIS — Z853 Personal history of malignant neoplasm of breast: Secondary | ICD-10-CM | POA: Diagnosis not present

## 2021-05-25 DIAGNOSIS — F0390 Unspecified dementia without behavioral disturbance: Secondary | ICD-10-CM | POA: Insufficient documentation

## 2021-05-25 DIAGNOSIS — Z7982 Long term (current) use of aspirin: Secondary | ICD-10-CM | POA: Diagnosis not present

## 2021-05-25 DIAGNOSIS — S0083XA Contusion of other part of head, initial encounter: Secondary | ICD-10-CM

## 2021-05-25 DIAGNOSIS — I10 Essential (primary) hypertension: Secondary | ICD-10-CM | POA: Diagnosis not present

## 2021-05-25 DIAGNOSIS — Z79899 Other long term (current) drug therapy: Secondary | ICD-10-CM | POA: Diagnosis not present

## 2021-05-25 DIAGNOSIS — I129 Hypertensive chronic kidney disease with stage 1 through stage 4 chronic kidney disease, or unspecified chronic kidney disease: Secondary | ICD-10-CM | POA: Insufficient documentation

## 2021-05-25 DIAGNOSIS — N182 Chronic kidney disease, stage 2 (mild): Secondary | ICD-10-CM | POA: Insufficient documentation

## 2021-05-25 DIAGNOSIS — E039 Hypothyroidism, unspecified: Secondary | ICD-10-CM | POA: Insufficient documentation

## 2021-05-25 DIAGNOSIS — E1122 Type 2 diabetes mellitus with diabetic chronic kidney disease: Secondary | ICD-10-CM | POA: Diagnosis not present

## 2021-05-25 DIAGNOSIS — S0081XA Abrasion of other part of head, initial encounter: Secondary | ICD-10-CM | POA: Insufficient documentation

## 2021-05-25 DIAGNOSIS — Z87891 Personal history of nicotine dependence: Secondary | ICD-10-CM | POA: Diagnosis not present

## 2021-05-25 DIAGNOSIS — M160 Bilateral primary osteoarthritis of hip: Secondary | ICD-10-CM | POA: Diagnosis not present

## 2021-05-25 DIAGNOSIS — I700 Atherosclerosis of aorta: Secondary | ICD-10-CM | POA: Diagnosis not present

## 2021-05-25 DIAGNOSIS — S0031XA Abrasion of nose, initial encounter: Secondary | ICD-10-CM | POA: Diagnosis not present

## 2021-05-25 DIAGNOSIS — R451 Restlessness and agitation: Secondary | ICD-10-CM | POA: Diagnosis not present

## 2021-05-25 DIAGNOSIS — W19XXXA Unspecified fall, initial encounter: Secondary | ICD-10-CM | POA: Insufficient documentation

## 2021-05-25 DIAGNOSIS — M47812 Spondylosis without myelopathy or radiculopathy, cervical region: Secondary | ICD-10-CM | POA: Diagnosis not present

## 2021-05-25 DIAGNOSIS — Z7984 Long term (current) use of oral hypoglycemic drugs: Secondary | ICD-10-CM | POA: Insufficient documentation

## 2021-05-25 DIAGNOSIS — Z043 Encounter for examination and observation following other accident: Secondary | ICD-10-CM | POA: Diagnosis not present

## 2021-05-25 DIAGNOSIS — Y92129 Unspecified place in nursing home as the place of occurrence of the external cause: Secondary | ICD-10-CM | POA: Diagnosis not present

## 2021-05-25 DIAGNOSIS — Z23 Encounter for immunization: Secondary | ICD-10-CM | POA: Insufficient documentation

## 2021-05-25 DIAGNOSIS — Y9301 Activity, walking, marching and hiking: Secondary | ICD-10-CM | POA: Insufficient documentation

## 2021-05-25 LAB — CBC WITH DIFFERENTIAL/PLATELET
Abs Immature Granulocytes: 0.06 10*3/uL (ref 0.00–0.07)
Basophils Absolute: 0.1 10*3/uL (ref 0.0–0.1)
Basophils Relative: 1 %
Eosinophils Absolute: 0.3 10*3/uL (ref 0.0–0.5)
Eosinophils Relative: 3 %
HCT: 31.2 % — ABNORMAL LOW (ref 36.0–46.0)
Hemoglobin: 10.3 g/dL — ABNORMAL LOW (ref 12.0–15.0)
Immature Granulocytes: 1 %
Lymphocytes Relative: 32 %
Lymphs Abs: 3.6 10*3/uL (ref 0.7–4.0)
MCH: 33.9 pg (ref 26.0–34.0)
MCHC: 33 g/dL (ref 30.0–36.0)
MCV: 102.6 fL — ABNORMAL HIGH (ref 80.0–100.0)
Monocytes Absolute: 1 10*3/uL (ref 0.1–1.0)
Monocytes Relative: 9 %
Neutro Abs: 6.1 10*3/uL (ref 1.7–7.7)
Neutrophils Relative %: 54 %
Platelets: 333 10*3/uL (ref 150–400)
RBC: 3.04 MIL/uL — ABNORMAL LOW (ref 3.87–5.11)
RDW: 12.9 % (ref 11.5–15.5)
WBC: 11.1 10*3/uL — ABNORMAL HIGH (ref 4.0–10.5)
nRBC: 0 % (ref 0.0–0.2)

## 2021-05-25 LAB — COMPREHENSIVE METABOLIC PANEL
ALT: 13 U/L (ref 0–44)
AST: 23 U/L (ref 15–41)
Albumin: 4.2 g/dL (ref 3.5–5.0)
Alkaline Phosphatase: 87 U/L (ref 38–126)
Anion gap: 10 (ref 5–15)
BUN: 34 mg/dL — ABNORMAL HIGH (ref 8–23)
CO2: 24 mmol/L (ref 22–32)
Calcium: 9.4 mg/dL (ref 8.9–10.3)
Chloride: 104 mmol/L (ref 98–111)
Creatinine, Ser: 1.09 mg/dL — ABNORMAL HIGH (ref 0.44–1.00)
GFR, Estimated: 50 mL/min — ABNORMAL LOW (ref >60)
Glucose, Bld: 170 mg/dL — ABNORMAL HIGH (ref 70–99)
Potassium: 4.8 mmol/L (ref 3.5–5.1)
Sodium: 138 mmol/L (ref 135–145)
Total Bilirubin: 0.7 mg/dL (ref 0.3–1.2)
Total Protein: 7.6 g/dL (ref 6.5–8.1)

## 2021-05-25 LAB — CBG MONITORING, ED: Glucose-Capillary: 168 mg/dL — ABNORMAL HIGH (ref 70–99)

## 2021-05-25 LAB — TROPONIN I (HIGH SENSITIVITY): Troponin I (High Sensitivity): 8 ng/L (ref <18)

## 2021-05-25 MED ORDER — LORAZEPAM 2 MG/ML IJ SOLN
1.0000 mg | Freq: Once | INTRAMUSCULAR | Status: AC
  Administered 2021-05-25: 1 mg via INTRAVENOUS
  Filled 2021-05-25: qty 1

## 2021-05-25 MED ORDER — TETANUS-DIPHTH-ACELL PERTUSSIS 5-2.5-18.5 LF-MCG/0.5 IM SUSY
0.5000 mL | PREFILLED_SYRINGE | Freq: Once | INTRAMUSCULAR | Status: AC
  Administered 2021-05-25: 0.5 mL via INTRAMUSCULAR
  Filled 2021-05-25: qty 0.5

## 2021-05-25 MED ORDER — HALOPERIDOL LACTATE 5 MG/ML IJ SOLN
2.0000 mg | Freq: Once | INTRAMUSCULAR | Status: AC
  Filled 2021-05-25: qty 1

## 2021-05-25 MED ORDER — HALOPERIDOL LACTATE 5 MG/ML IJ SOLN
INTRAMUSCULAR | Status: AC
  Administered 2021-05-25: 2 mg via INTRAVENOUS
  Filled 2021-05-25: qty 1

## 2021-05-25 NOTE — ED Notes (Signed)
[facility] called, 11th in line

## 2021-05-25 NOTE — ED Triage Notes (Signed)
Pt here via [facility] from [facility] for unwitnessed fall. Pt was outside, staff went to check on her and found her on the ground, pt has abrasions to face. Pt combative, hx dementia

## 2021-05-25 NOTE — ED Notes (Signed)
Pt arrived w/ [facility], pt combative upon arrival, attempting to hit, kick and bite staff. Dr Darl Householder aware, orders for ativan and haldol placed

## 2021-05-25 NOTE — Discharge Instructions (Addendum)
Take tylenol for headache.   Your CT did not show any bleeding or fractures.  Expect your face to be black and blue  You need fall precautions at the facility.  See your doctor for follow up   Return to ER if you have headache, vomiting, agitation

## 2021-05-25 NOTE — ED Provider Notes (Signed)
[facility] EMERGENCY DEPARTMENT Provider Note   CSN: [id number] Arrival date & time: 05/25/21  1815     History Chief Complaint  Patient presents with   Lytle Michaels    [name] is a 87 y.o. female history of diabetes, reflux, hypertension, here presenting with fall.  Patient is from a nursing home.  Patient apparently was walking after dinner and was found down on the floor.  She is agitated and unable to give me any history.  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  Patient was noted to have abrasion on the bridge of her nose.  The history is provided by the patient and the EMS personnel. .  Level V caveat- dementia     Past Medical History:  Diagnosis Date   Breast cancer ([place])    Cancer ([place])    breast cancer on left - 10  years ago   Depression    Diabetes mellitus without complication ([place])    type 2   Family history of adverse reaction to anesthesia    Sister gets sick   GERD (gastroesophageal reflux disease)    Heart murmur    as a child   Hypertension    Hypothyroidism    Personal history of radiation therapy 2010   PONV (postoperative nausea and vomiting)    Wears glasses     Patient Active Problem List   Diagnosis Date Noted   Arthritis 01/08/2019   Carpal tunnel syndrome on left 01/08/2019   Colon cancer screening 01/08/2019   Early satiety 01/08/2019   Late effect of stroke 01/08/2019   Moderate single current episode of major depressive disorder ([place]) 01/08/2019   Pure hypercholesterolemia 01/08/2019   Non-intractable vomiting with nausea    Concussion 11/22/2016   Post concussive syndrome 11/21/2016   Post concussion syndrome 11/21/2016   Hematoma of scalp    Generalized anxiety disorder 10/23/2016   Severe episode of recurrent major depressive disorder, without psychotic features ([place])    Reactive depression    Debilitated 10/09/2016   Transient cerebral ischemia    Anxiety state    Controlled type 2  diabetes mellitus with complication, without long-term current use of insulin ([place])    History of breast cancer    Acute blood loss anemia    Stage 2 chronic kidney disease    Syncope 10/07/2016   Stroke ([place]) 10/06/2016   Stroke (cerebrum) ([place]) 10/06/2016   Aphasia 10/06/2016   Leukocytosis 10/06/2016   Essential hypertension 10/06/2016   Hypothyroidism 10/06/2016   GERD (gastroesophageal reflux disease) 10/06/2016   Hyperlipemia 10/06/2016   Stenosing tenosynovitis 07/12/2015    Past Surgical History:  Procedure Laterality Date   ABDOMINAL HYSTERECTOMY     APPENDECTOMY     ARCUATE KERATECTOMY     BREAST LUMPECTOMY Left 2010/ 2018   Dr. Dalbert Batman for 2018 surgery   BREAST LUMPECTOMY Left 07/18/2017   Procedure: LEFT BREAST LUMPECTOMY;  Surgeon: Fanny Skates, MD;  Location: [facility];  Service: General;  Laterality: Left;   BUNIONECTOMY     right   CARPAL TUNNEL RELEASE     right   CARPAL TUNNEL RELEASE Left 05/24/2014   Procedure: LEFT CARPAL TUNNEL RELEASE LEFT THUMB TRIGGER FINGER RELEASE;  Surgeon: Cammie Sickle, MD;  Location: [place];  Service: Orthopedics;  Laterality: Left;  left thumb also site of incision   DEBRIDEMENT OF ABDOMINAL WALL ABSCESS  1978   OVARIAN CYST SURGERY  x2-   TONSILLECTOMY     TRIGGER FINGER RELEASE Left 01/25/2016   Procedure: RELEASE A-1 PULLEY LEFT RING FINGER, LEFT SMALL FINGER;  Surgeon: Daryll Brod, MD;  Location: [facility];  Service: Orthopedics;  Laterality: Left;  ANESTHESIA: IV REGIONAL FAB     OB History   No obstetric history on file.     Family History  Problem Relation Age of Onset   Hypertension Other     Social History   Tobacco Use   Smoking status: Former    Pack years: 0.00    Types: Cigarettes   Smokeless tobacco: Never   Tobacco comments:    Quit smoking  30 years agl  Vaping Use   Vaping Use: Never used  Substance Use Topics   Alcohol use: No    Alcohol/week: 0.0  standard drinks   Drug use: No    Home Medications Prior to Admission medications   Medication Sig Start Date End Date Taking? Authorizing Provider  acetaminophen (TYLENOL) 500 MG tablet Take 500 mg by mouth every 6 (six) hours as needed (for pain. (typically before bed)).    [provider]  aspirin EC 81 MG tablet Take 81 mg by mouth at bedtime.    [provider]  atorvastatin (LIPITOR) 40 MG tablet Take 1 tablet (40 mg total) by mouth daily at 6 PM. Patient taking differently: Take 40 mg by mouth daily.  10/09/16   Geradine Girt, DO  bismuth subsalicylate (STOMACH RELIEF) 262 MG/15ML suspension Take 30 mLs by mouth 2 (two) times daily as needed for indigestion or diarrhea or loose stools.    [provider]  busPIRone (BUSPAR) 10 MG tablet Take 10 mg by mouth 2 (two) times daily.  12/28/18   [provider]  Calcium Carbonate-Vitamin D3 (CALCIUM + VITAMIN D3) 600-400 MG-UNIT TABS Take 1 tablet by mouth 2 (two) times daily.    [provider]  felodipine (PLENDIL) 5 MG 24 hr tablet Take 5 mg by mouth daily.    [provider]  levothyroxine (SYNTHROID) 50 MCG tablet Take 50 mcg by mouth daily before breakfast.    [provider]  losartan (COZAAR) 100 MG tablet Take 1 tablet (100 mg total) by mouth daily. 10/16/16   Love, Ivan Anchors, PA-C  Melatonin 5 MG TABS Take 5 mg by mouth at bedtime as needed (for sleep.).     [provider]  metFORMIN (GLUCOPHAGE-XR) 500 MG 24 hr tablet Take 500 mg by mouth every evening. 05/09/17   [provider]  Multiple Vitamins-Minerals (PRESERVISION AREDS 2 PO) Take 1 tablet by mouth 2 (two) times daily.     [provider]    Allergies    Tramadol, Morphine and related, Penicillins, Tetracyclines & related, Meclizine, Zantac  [ranitidine hcl], Hydrocodone-acetaminophen, and Sitagliptin  Review of Systems   Review of Systems  Skin:  Positive for wound.   Psychiatric/Behavioral:  Positive for agitation.   All other systems reviewed and are negative.  Physical Exam Updated Vital Signs BP (!) 205/101 (BP Location: Right Arm)   Pulse 100   Temp 98 F (36.7 C) (Oral)   Ht 5' (1.524 m)   Wt 50 kg   SpO2 97%   BMI 21.53 kg/m   Physical Exam Vitals and nursing note reviewed.  Constitutional:      Comments: Agitated   HENT:     Head:     Comments: + abrasion on the face and bridge of  nose     Mouth/Throat:     Mouth: Mucous membranes are moist.  Eyes:     Pupils: Pupils are equal, round, and reactive to light.  Neck:     Comments: No midline tenderness  Cardiovascular:     Rate and Rhythm: Normal rate and regular rhythm.     Pulses: Normal pulses.     Heart sounds: Normal heart sounds.  Pulmonary:     Effort: Pulmonary effort is normal.     Breath sounds: Normal breath sounds.  Abdominal:     General: Abdomen is flat.     Palpations: Abdomen is soft.  Musculoskeletal:     Comments: No spinal tenderness, nl ROM bilateral hips   Skin:    General: Skin is warm.     Capillary Refill: Capillary refill takes less than 2 seconds.  Neurological:     General: No focal deficit present.     Mental Status: She is oriented to person, place, and time.  Psychiatric:        Mood and Affect: Mood normal.    ED Results / Procedures / Treatments   Labs (all labs ordered are listed, but only abnormal results are displayed) Labs Reviewed  CBG MONITORING, ED - Abnormal; Notable for the following components:      Result Value   Glucose-Capillary 168 (*)    All other components within normal limits  CBC WITH DIFFERENTIAL/PLATELET  COMPREHENSIVE METABOLIC PANEL  TROPONIN I (HIGH SENSITIVITY)    EKG EKG Interpretation  Date/Time:  Friday May 25 2021 18:41:33 EDT Ventricular Rate:  84 PR Interval:  160 QRS Duration: 100 QT Interval:  389 QTC Calculation: 460 R Axis:   56 Text Interpretation: Sinus rhythm Probable left atrial  enlargement No significant change since last tracing Confirmed by Wandra Arthurs [phone]) on 05/25/2021 7:03:49 PM  Radiology No results found.  Procedures Procedures     Medications Ordered in ED Medications  LORazepam (ATIVAN) injection 1 mg (1 mg Intravenous Given 05/25/21 1830)  haloperidol lactate (HALDOL) injection 2 mg (2 mg Intravenous Given 05/25/21 1833)    ED Course  I have reviewed the triage vital signs and the nursing notes.  Pertinent labs & imaging results that were available during my care of the patient were reviewed by me and considered in my medical decision making (see chart for details).    MDM Rules/Calculators/A&P                         [name] is a 87 y.o. female here presenting with a fall.  Patient was agitated on arrival and has obvious signs of head and facial injury.  Given Ativan Haldol for agitation.  Plan to get CT head neck and x-rays and basic labs  9:05 PM Patient is now calm.  CT head neck and x-rays unremarkable.  Labs are unremarkable.  Tetanus updated in the ED.  Stable for discharge back to facility    Final Clinical Impression(s) / ED Diagnoses Final diagnoses:  None    Rx / DC Orders ED Discharge Orders     None        Drenda Freeze, MD 05/25/21 2105

## 2021-05-25 NOTE — ED Notes (Signed)
Called report to [facility]

## 2021-05-25 NOTE — ED Notes (Signed)
Pt changed brief and gown, placed on purewick

## 2021-05-26 NOTE — ED Notes (Signed)
Report given by [facility]

## 2021-08-08 DIAGNOSIS — Z23 Encounter for immunization: Secondary | ICD-10-CM | POA: Diagnosis not present

## 2021-11-27 DIAGNOSIS — Z20822 Contact with and (suspected) exposure to covid-19: Secondary | ICD-10-CM | POA: Diagnosis not present

## 2021-11-29 DIAGNOSIS — Z20822 Contact with and (suspected) exposure to covid-19: Secondary | ICD-10-CM | POA: Diagnosis not present

## 2021-12-04 DIAGNOSIS — Z20822 Contact with and (suspected) exposure to covid-19: Secondary | ICD-10-CM | POA: Diagnosis not present

## 2021-12-06 DIAGNOSIS — Z20822 Contact with and (suspected) exposure to covid-19: Secondary | ICD-10-CM | POA: Diagnosis not present

## 2021-12-12 ENCOUNTER — Emergency Department (HOSPITAL_COMMUNITY)
Admission: EM | Admit: 2021-12-12 | Discharge: 2021-12-12 | Disposition: A | Discharge status: Skilled Nursing Facility | Payer: Medicare Other | Attending: Emergency Medicine | Admitting: Emergency Medicine

## 2021-12-12 ENCOUNTER — Emergency Department (HOSPITAL_COMMUNITY): Payer: Medicare Other

## 2021-12-12 DIAGNOSIS — Z7982 Long term (current) use of aspirin: Secondary | ICD-10-CM | POA: Insufficient documentation

## 2021-12-12 DIAGNOSIS — R001 Bradycardia, unspecified: Secondary | ICD-10-CM | POA: Diagnosis not present

## 2021-12-12 DIAGNOSIS — S0181XA Laceration without foreign body of other part of head, initial encounter: Secondary | ICD-10-CM | POA: Diagnosis not present

## 2021-12-12 DIAGNOSIS — I10 Essential (primary) hypertension: Secondary | ICD-10-CM | POA: Insufficient documentation

## 2021-12-12 DIAGNOSIS — Z043 Encounter for examination and observation following other accident: Secondary | ICD-10-CM | POA: Diagnosis not present

## 2021-12-12 DIAGNOSIS — F0390 Unspecified dementia without behavioral disturbance: Secondary | ICD-10-CM | POA: Insufficient documentation

## 2021-12-12 DIAGNOSIS — E119 Type 2 diabetes mellitus without complications: Secondary | ICD-10-CM | POA: Diagnosis not present

## 2021-12-12 DIAGNOSIS — W19XXXA Unspecified fall, initial encounter: Secondary | ICD-10-CM | POA: Insufficient documentation

## 2021-12-12 DIAGNOSIS — S0990XA Unspecified injury of head, initial encounter: Secondary | ICD-10-CM

## 2021-12-12 DIAGNOSIS — M160 Bilateral primary osteoarthritis of hip: Secondary | ICD-10-CM | POA: Diagnosis not present

## 2021-12-12 DIAGNOSIS — Z79899 Other long term (current) drug therapy: Secondary | ICD-10-CM | POA: Diagnosis not present

## 2021-12-12 DIAGNOSIS — Z7984 Long term (current) use of oral hypoglycemic drugs: Secondary | ICD-10-CM | POA: Insufficient documentation

## 2021-12-12 DIAGNOSIS — R58 Hemorrhage, not elsewhere classified: Secondary | ICD-10-CM | POA: Diagnosis not present

## 2021-12-12 DIAGNOSIS — R609 Edema, unspecified: Secondary | ICD-10-CM | POA: Diagnosis not present

## 2021-12-12 LAB — BASIC METABOLIC PANEL
Anion gap: 11 (ref 5–15)
BUN: 30 mg/dL — ABNORMAL HIGH (ref 8–23)
CO2: 21 mmol/L — ABNORMAL LOW (ref 22–32)
Calcium: 9.6 mg/dL (ref 8.9–10.3)
Chloride: 105 mmol/L (ref 98–111)
Creatinine, Ser: 1.15 mg/dL — ABNORMAL HIGH (ref 0.44–1.00)
GFR, Estimated: 47 mL/min — ABNORMAL LOW (ref >60)
Glucose, Bld: 109 mg/dL — ABNORMAL HIGH (ref 70–99)
Potassium: 4.6 mmol/L (ref 3.5–5.1)
Sodium: 137 mmol/L (ref 135–145)

## 2021-12-12 LAB — CBC WITH DIFFERENTIAL/PLATELET
Abs Immature Granulocytes: 0.02 10*3/uL (ref 0.00–0.07)
Basophils Absolute: 0 10*3/uL (ref 0.0–0.1)
Basophils Relative: 1 %
Eosinophils Absolute: 0.2 10*3/uL (ref 0.0–0.5)
Eosinophils Relative: 3 %
HCT: 32.6 % — ABNORMAL LOW (ref 36.0–46.0)
Hemoglobin: 11 g/dL — ABNORMAL LOW (ref 12.0–15.0)
Immature Granulocytes: 0 %
Lymphocytes Relative: 23 %
Lymphs Abs: 1.4 10*3/uL (ref 0.7–4.0)
MCH: 33.7 pg (ref 26.0–34.0)
MCHC: 33.7 g/dL (ref 30.0–36.0)
MCV: 100 fL (ref 80.0–100.0)
Monocytes Absolute: 0.5 10*3/uL (ref 0.1–1.0)
Monocytes Relative: 9 %
Neutro Abs: 4.1 10*3/uL (ref 1.7–7.7)
Neutrophils Relative %: 64 %
Platelets: 272 10*3/uL (ref 150–400)
RBC: 3.26 MIL/uL — ABNORMAL LOW (ref 3.87–5.11)
RDW: 12.3 % (ref 11.5–15.5)
WBC: 6.3 10*3/uL (ref 4.0–10.5)
nRBC: 0 % (ref 0.0–0.2)

## 2021-12-12 LAB — CBG MONITORING, ED: Glucose-Capillary: 123 mg/dL — ABNORMAL HIGH (ref 70–99)

## 2021-12-12 MED ORDER — LIDOCAINE-EPINEPHRINE (PF) 2 %-1:200000 IJ SOLN
20.0000 mL | Freq: Once | INTRAMUSCULAR | Status: AC
  Administered 2021-12-12: 20 mL
  Filled 2021-12-12: qty 20

## 2021-12-12 NOTE — Discharge Instructions (Addendum)

## 2021-12-12 NOTE — ED Provider Notes (Signed)
[name] Provider Note   CSN: [id number] Arrival date & time: 12/12/21  1250     History  Chief Complaint  Patient presents with   [name]    [name] is a 88 y.o. female with PMH of diabetes, GERD, HTN, history of falls who presents from [facility] unit after suffering an unwitnessed fall. Unable to assess pain or ROS 2/2 dementia and abnormal speech.     Home Medications Prior to Admission medications   Medication Sig Start Date End Date Taking? Authorizing Provider  acetaminophen (TYLENOL) 500 MG tablet Take 500 mg by mouth every 6 (six) hours as needed (pain).    [provider]  aspirin 81 MG chewable tablet Chew 81 mg by mouth daily.    [provider]  atorvastatin (LIPITOR) 40 MG tablet Take 1 tablet (40 mg total) by mouth daily at 6 PM. Patient not taking: Reported on 05/25/2021 10/09/16   Emily Girt, DO  bismuth subsalicylate (STOMACH RELIEF) 262 MG/15ML suspension Take 30 mLs by mouth 2 (two) times daily as needed for indigestion or diarrhea or loose stools.    [provider]  busPIRone (BUSPAR) 5 MG tablet Take 5 mg by mouth 2 (two) times daily. 01/11/21   [provider]  felodipine (PLENDIL) 5 MG 24 hr tablet Take 5 mg by mouth daily.    [provider]  guaifenesin (ROBITUSSIN) 100 MG/5ML syrup Take 100 mg by mouth every 12 (twelve) hours as needed for cough or congestion.    [provider]  levothyroxine (SYNTHROID) 50 MCG tablet Take 50 mcg by mouth daily before breakfast.    [provider]  loperamide (IMODIUM A-D) 2 MG tablet Take 2 mg by mouth as needed for diarrhea or loose stools (max 4 tabs in 24 hours).    [provider]  losartan (COZAAR) 100 MG tablet Take 1 tablet (100 mg total) by mouth daily. 10/16/16   Solis, Emily Anchors, PA-C  Melatonin 5 MG TABS Take 5 mg by mouth at bedtime.    [provider]  metFORMIN  (GLUCOPHAGE-XR) 500 MG 24 hr tablet Take 500 mg by mouth every evening. 05/09/17   [provider]  ondansetron (ZOFRAN) 4 MG tablet Take 4 mg by mouth every 8 (eight) hours as needed for nausea or vomiting.    [provider]  Skin Protectants, Misc. (BAZA PROTECT EX) Apply 1 application topically See admin instructions. Apply topically twice daily, may also apply to groin and peri area as needed after incontinent episodes    [provider]      Allergies    Tramadol, Morphine and related, Penicillins, Tetracyclines & related, Lexapro [escitalopram], Meclizine, Metformin and related, Sertraline, Xanax [alprazolam], Zantac  [ranitidine hcl], Hydrocodone-acetaminophen, and Sitagliptin    Review of Systems   Review of Systems  Unable to perform ROS: Dementia   Physical Exam Updated Vital Signs BP (!) 112/55 (BP Location: Right Arm)    Pulse (!) 53    Temp 98.2 F (36.8 C) (Oral)    Resp 16    SpO2 97%  Constitutional: Elderly female in no acute distress. HENT: 1.5 cm open laceration on L frontal aspect of forehead. Head is otherwise atraumatic. Cardio: Regular rate and rhythm. No murmurs, rubs, gallops. Pulm: Normal work of breathing on room air. Abdomen: Soft, nontender. MSK: Negative for deformities. Passive range of motion of bilateral UE and LE intact. Pelvis is stable. Neuro: Alert  but not oriented.  ED Results / Procedures / Treatments   Labs (all labs ordered are listed, but only abnormal results are displayed) Labs Reviewed  CBG MONITORING, ED - Abnormal; Notable for the following components:      Result Value   Glucose-Capillary 123 (*)    All other components within normal limits  CBC WITH DIFFERENTIAL/PLATELET  BASIC METABOLIC PANEL    EKG None  Radiology DG Pelvis 1-2 Views  Result Date: 12/12/2021 CLINICAL DATA:  Fall EXAM: PELVIS - 1-2 VIEW COMPARISON:  Pelvis radiographs 05/25/2021 FINDINGS: There is no acute fracture or dislocation.  Femoroacetabular alignment is maintained. Mild degenerative changes of the hips are noted. SI joints and symphysis pubis are intact. The soft tissues are unremarkable. IMPRESSION: No acute fracture or dislocation. Electronically Signed   By: Valetta Mole M.D.   On: 12/12/2021 14:01    Procedures Procedures  Medications Ordered in ED Medications - No data to display  ED Course/ Medical Decision Making/ A&P  [name] is a 88 y.o. female with history of dementia, diabetes, GERD, HTN, frequent falls who presents after suffering an unwitnessed fall. She has a 1.5 cm laceration to the L frontal aspect of the forehead. On review of chart, her family wishes include comfort measures even in the event of repeat falls and no hospitalization. For this reason CT head/neck were not obtained as imaging would not change management course. X-ray pelvis was obtained as patient is ambulatory at her memory care unit which showed no acute fracture or dislocation.  My attending, Dr. Reather Converse, will take over the remainder of care at this time.  Medical Decision Making Amount and/or Complexity of Data Reviewed Labs: ordered. Radiology: ordered.   Final Clinical Impression(s) / ED Diagnoses Final diagnoses:  Fall    Rx / DC Orders ED Discharge Orders     None         Emily Gordon, DO 12/12/21 1426    Emily Morrison, MD 12/12/21 1538

## 2021-12-12 NOTE — ED Triage Notes (Signed)
Pt BIB [facility] from [facility] unit after unwitnessed fall. EMS reports 1.5in head lac to left forehead, no tenderness on palpation to spine. Patient is at baseline per facility. Patient did not answer EMS questions but did respond to her name.

## 2021-12-12 NOTE — Progress Notes (Signed)
Manufacturing engineer [facility]) Hospital Liaison note.     This is a current [facility] patient with a terminal diagnosis of Cerebrovascular atherosclerosis.  [facility] hospital liaison will continue to follow for disposition and coordination of care.  A Please do not hesitate to call with questions.    Thank you,    Farrel Gordon, RN, [facility] (listed on [facility] under [place])     [phone]

## 2021-12-12 NOTE — ED Provider Notes (Signed)
..  Laceration Repair  Date/Time: 12/12/2021 3:47 PM Performed by: Margarita Mail, PA-C Authorized by: Margarita Mail, PA-C   Consent:    Consent obtained:  Verbal   Consent given by:  Patient   Risks discussed:  Infection, need for additional repair, pain, poor cosmetic result and poor wound healing   Alternatives discussed:  No treatment and delayed treatment Universal protocol:    Procedure explained and questions answered to patient or proxy's satisfaction: yes     Relevant documents present and verified: yes     Test results available: yes     Imaging studies available: yes     Required blood products, implants, devices, and special equipment available: yes     Site/side marked: yes     Immediately prior to procedure, a time out was called: yes     Patient identity confirmed:  Verbally with patient Anesthesia:    Anesthesia method:  Local infiltration   Local anesthetic:  Lidocaine 1% w/o epi Laceration details:    Location:  Scalp   Scalp location:  Frontal   Length (cm):  5   Depth (mm):  3 Pre-procedure details:    Preparation:  Patient was prepped and draped in usual sterile fashion Exploration:    Wound exploration: wound explored through full range of motion and entire depth of wound visualized   Treatment:    Area cleansed with:  Povidone-iodine   Amount of cleaning:  Standard   Irrigation solution:  Sterile saline   Irrigation method:  Pressure wash Skin repair:    Repair method:  Sutures   Suture size:  5-0   Suture material:  Nylon   Suture technique:  Simple interrupted and running locked   Number of sutures:  8 Approximation:    Approximation:  Close Repair type:    Repair type:  Simple Post-procedure details:    Dressing:  Adhesive bandage   Procedure completion:  Oswaldo Conroy, PA-C 12/12/21 1549    Elnora Morrison, MD 12/13/21 [phone]

## 2021-12-12 NOTE — ED Provider Notes (Signed)
Care of the patient assumed at the change of shift pending laceration repair and lab evaluation. Plan for discharge when complete.  Physical Exam  BP (!) 112/55 (BP Location: Right Arm)    Pulse (!) 53    Temp 98.2 F (36.8 C) (Oral)    Resp 16    SpO2 97%   Physical Exam  Procedures  Procedures  ED Course / MDM    Medical Decision Making Labs reviewed and unremarkable. No significant anemia or electrolyte abnormalities.   Problems Addressed: Fall, initial encounter: acute illness or injury Injury of head, initial encounter: acute illness or injury that poses a threat to life or bodily functions Laceration of forehead, initial encounter: acute illness or injury  Amount and/or Complexity of Data Reviewed Labs: ordered. Decision-making details documented in ED Course. Radiology: ordered and independent interpretation performed. Decision-making details documented in ED Course.  Risk Prescription drug management.          Truddie Hidden, MD 12/12/21 [phone]

## 2022-03-30 ENCOUNTER — Emergency Department (HOSPITAL_COMMUNITY)
Admission: EM | Admit: 2022-03-30 | Discharge: 2022-03-30 | Disposition: A | Discharge status: Skilled Nursing Facility | Attending: Emergency Medicine | Admitting: Emergency Medicine

## 2022-03-30 ENCOUNTER — Emergency Department (HOSPITAL_COMMUNITY)

## 2022-03-30 ENCOUNTER — Other Ambulatory Visit: Payer: Self-pay

## 2022-03-30 ENCOUNTER — Encounter (HOSPITAL_COMMUNITY): Payer: Self-pay

## 2022-03-30 DIAGNOSIS — S0081XA Abrasion of other part of head, initial encounter: Secondary | ICD-10-CM

## 2022-03-30 DIAGNOSIS — Z7982 Long term (current) use of aspirin: Secondary | ICD-10-CM | POA: Diagnosis not present

## 2022-03-30 DIAGNOSIS — Z853 Personal history of malignant neoplasm of breast: Secondary | ICD-10-CM | POA: Insufficient documentation

## 2022-03-30 DIAGNOSIS — E119 Type 2 diabetes mellitus without complications: Secondary | ICD-10-CM | POA: Diagnosis not present

## 2022-03-30 DIAGNOSIS — S0003XA Contusion of scalp, initial encounter: Secondary | ICD-10-CM | POA: Diagnosis not present

## 2022-03-30 DIAGNOSIS — I10 Essential (primary) hypertension: Secondary | ICD-10-CM | POA: Insufficient documentation

## 2022-03-30 DIAGNOSIS — Z7984 Long term (current) use of oral hypoglycemic drugs: Secondary | ICD-10-CM | POA: Diagnosis not present

## 2022-03-30 DIAGNOSIS — W19XXXA Unspecified fall, initial encounter: Secondary | ICD-10-CM | POA: Insufficient documentation

## 2022-03-30 DIAGNOSIS — F0390 Unspecified dementia without behavioral disturbance: Secondary | ICD-10-CM | POA: Diagnosis not present

## 2022-03-30 DIAGNOSIS — Y92129 Unspecified place in nursing home as the place of occurrence of the external cause: Secondary | ICD-10-CM | POA: Diagnosis not present

## 2022-03-30 DIAGNOSIS — E039 Hypothyroidism, unspecified: Secondary | ICD-10-CM | POA: Insufficient documentation

## 2022-03-30 DIAGNOSIS — Z87891 Personal history of nicotine dependence: Secondary | ICD-10-CM | POA: Insufficient documentation

## 2022-03-30 DIAGNOSIS — S0990XA Unspecified injury of head, initial encounter: Secondary | ICD-10-CM | POA: Diagnosis present

## 2022-03-30 DIAGNOSIS — Z79899 Other long term (current) drug therapy: Secondary | ICD-10-CM | POA: Diagnosis not present

## 2022-03-30 DIAGNOSIS — S0083XA Contusion of other part of head, initial encounter: Secondary | ICD-10-CM | POA: Diagnosis not present

## 2022-03-30 NOTE — ED Notes (Signed)
Report given to Zandra Abts ?

## 2022-03-30 NOTE — ED Triage Notes (Addendum)
BIB EMS from [place] for unwitnessed fall, lac to left forehead, has dementia and is on Hospice, no blood thinners ?

## 2022-03-30 NOTE — ED Provider Notes (Signed)
? ?[facility] DEPT ?Provider Note: Georgena Spurling, MD, FACEP ? ?CSN: [id number] ?MRN: [id number] ?ARRIVAL: 03/30/22 at 0513 ?ROOM: [facility] ? ? ?CHIEF COMPLAINT  ?Fall ? ?Level 5 caveat: Dementia ?HISTORY OF PRESENT ILLNESS  ?03/30/22 5:45 AM ?[name] is a 88 y.o. female who is from her nursing facility this morning after an unwitnessed fall.  She has hematomas and an abrasion to her left forehead and frontal scalp.  She has dementia and is on hospice care.  She is not on anticoagulation. ? ? ?Past Medical History:  ?Diagnosis Date  ? Breast cancer ([place])   ? Cancer [facility])   ? breast cancer on left - 10  years ago  ? Depression   ? Diabetes mellitus without complication ([place])   ? type 2  ? Family history of adverse reaction to anesthesia   ? Sister gets sick  ? GERD (gastroesophageal reflux disease)   ? Heart murmur   ? as a child  ? Hypertension   ? Hypothyroidism   ? Personal history of radiation therapy 2010  ? PONV (postoperative nausea and vomiting)   ? Wears glasses   ? ? ?Past Surgical History:  ?Procedure Laterality Date  ? ABDOMINAL HYSTERECTOMY    ? APPENDECTOMY    ? ARCUATE KERATECTOMY    ? BREAST LUMPECTOMY Left 2010/ 2018  ? Dr. Dalbert Batman for 2018 surgery  ? BREAST LUMPECTOMY Left 07/18/2017  ? Procedure: LEFT BREAST LUMPECTOMY;  Surgeon: Fanny Skates, MD;  Location: [facility];  Service: General;  Laterality: Left;  ? BUNIONECTOMY    ? right  ? CARPAL TUNNEL RELEASE    ? right  ? CARPAL TUNNEL RELEASE Left 05/24/2014  ? Procedure: LEFT CARPAL TUNNEL RELEASE LEFT THUMB TRIGGER FINGER RELEASE;  Surgeon: Cammie Sickle, MD;  Location: [facility];  Service: Orthopedics;  Laterality: Left;  left thumb also site of incision  ? DEBRIDEMENT OF ABDOMINAL WALL ABSCESS  1978  ? OVARIAN CYST SURGERY    ? x2-  ? TONSILLECTOMY    ? TRIGGER FINGER RELEASE Left 01/25/2016  ? Procedure: RELEASE A-1 PULLEY LEFT RING FINGER, LEFT SMALL FINGER;  Surgeon: Daryll Brod, MD;  Location: [facility];  Service: Orthopedics;  Laterality: Left;  ANESTHESIA: IV [facility]  ? ? ?Family History  ?Problem Relation Age of Onset  ? Hypertension Other   ? ? ?Social History  ? ?Tobacco Use  ? Smoking status: Former  ?  Types: Cigarettes  ? Smokeless tobacco: Never  ? Tobacco comments:  ?  Quit smoking  30 years agl  ?Vaping Use  ? Vaping Use: Never used  ?Substance Use Topics  ? Alcohol use: No  ?  Alcohol/week: 0.0 standard drinks  ? Drug use: No  ? ? ?Prior to Admission medications   ?Medication Sig Start Date End Date Taking? Authorizing Provider  ?acetaminophen (TYLENOL) 500 MG tablet Take 500 mg by mouth every 6 (six) hours as needed (pain).    [provider]  ?aspirin 81 MG chewable tablet Chew 81 mg by mouth daily.    [provider]  ?bismuth subsalicylate (STOMACH RELIEF) 262 MG/15ML suspension Take 30 mLs by mouth 2 (two) times daily as needed for indigestion or diarrhea or loose stools.    [provider]  ?busPIRone (BUSPAR) 5 MG tablet Take 5 mg by mouth 2 (two) times daily. 01/11/21   [provider]  ?felodipine (PLENDIL) 5 MG 24 hr tablet Take 5 mg  by mouth daily.    [provider]  ?guaifenesin (ROBITUSSIN) 100 MG/5ML syrup Take 100 mg by mouth every 12 (twelve) hours as needed for cough or congestion.    [provider]  ?levothyroxine (SYNTHROID) 50 MCG tablet Take 50 mcg by mouth daily before breakfast.    [provider]  ?loperamide (IMODIUM A-D) 2 MG tablet Take 2 mg by mouth as needed for diarrhea or loose stools (max 4 tabs in 24 hours).    [provider]  ?losartan (COZAAR) 100 MG tablet Take 1 tablet (100 mg total) by mouth daily. 10/16/16   Bary Leriche, PA-C  ?Melatonin 5 MG TABS Take 5 mg by mouth at bedtime.    [provider]  ?metFORMIN (GLUCOPHAGE-XR) 500 MG 24 hr tablet Take 500 mg by mouth every evening. 05/09/17   [provider]  ?ondansetron (ZOFRAN) 4 MG tablet Take 4 mg by mouth  every 8 (eight) hours as needed for nausea or vomiting.    [provider]  ?Skin Protectants, Misc. (BAZA PROTECT EX) Apply 1 application topically See admin instructions. Apply topically twice daily, may also apply to groin and peri area as needed after incontinent episodes    [provider]  ? ? ?Allergies ?Tramadol, Morphine and related, Penicillins, Tetracyclines & related, Lexapro [escitalopram], Meclizine, Metformin and related, Sertraline, Xanax [alprazolam], Zantac  [ranitidine hcl], Hydrocodone-acetaminophen, and Sitagliptin ? ? ?REVIEW OF SYSTEMS  ?Level of caveat ? ? ?PHYSICAL EXAMINATION  ?Initial Vital Signs ?Blood pressure (!) 179/74, pulse (!) 50, temperature 97.9 ?F (36.6 ?C), temperature source Oral, resp. rate 16, height 5' (1.524 m), weight 50 kg, SpO2 97 %. ? ?Examination ?General: Well-developed, well-nourished female in no acute distress; appearance consistent with age of record ?HENT: normocephalic; left forehead and scalp hematomas and abrasion: ? ? ? ?Eyes: pupils equal, round and reactive to light; extraocular muscles grossly intact ?Neck: supple; nontender ?Heart: regular rate and rhythm ?Lungs: clear to auscultation bilaterally ?Abdomen: soft; nondistended; nontender; bowel sounds present ?Extremities: No acute deformity; no pain on passive movement ?Neurologic: Awake, alert, confused; noted to move all extremities ?Skin: Warm and dry ?Psychiatric: Flat affect ? ? ?RESULTS  ?Summary of this visit's results, reviewed and interpreted by myself: ? ? EKG Interpretation ? ?Date/Time:    ?Ventricular Rate:    ?PR Interval:    ?QRS Duration:   ?QT Interval:    ?QTC Calculation:   ?R Axis:     ?Text Interpretation:   ?  ? ?  ? ?Laboratory Studies: ?No results found for this or any previous visit (from the past 24 hour(s)). ?Imaging Studies: ?CT Head Wo Contrast ? ?Result Date: 03/30/2022 ?CLINICAL DATA:  Left head trauma.  Unwitnessed fall. EXAM: CT HEAD WITHOUT CONTRAST  TECHNIQUE: Contiguous axial images were obtained from the base of the skull through the vertex without intravenous contrast. RADIATION DOSE REDUCTION: This exam was performed according to the departmental dose-optimization program which includes automated exposure control, adjustment of the mA and/or kV according to patient size and/or use of iterative reconstruction technique. COMPARISON:  Head CT 05/25/2021. FINDINGS: Brain: There is moderately advanced cerebral atrophy with moderate to severe atrophic ventriculomegaly and small-vessel disease. No midline shift is seen, and no progression in the atrophy and small-vessel changes. Relatively mild cerebellar atrophy. No new asymmetry is seen concerning for an acute infarct, hemorrhage or mass. The basal cisterns are clear. Vascular: There are calcifications in both distal vertebral arteries and siphons but no hyperdense central vessels.  Skull: The calvarium, skull base and orbits are intact without focal lesions. There is broad-based left frontotemporal scalp hematoma. Sinuses/Orbits: There are old lens extractions. There is mild membrane disease in the ethmoid and right maxillary sinuses without fluid level. There is preservation of the normal aeration of the remaining sinuses and the bilateral mastoid air cells. Other: None. IMPRESSION: Broad-based left frontotemporal scalp hematoma with no acute intracranial CT findings, depressed skull fractures or further interval changes. Chronic change. Electronically Signed   By: Telford Nab M.D.   On: 03/30/2022 06:19   ? ?ED COURSE and MDM  ?Nursing notes, initial and subsequent vitals signs, including pulse oximetry, reviewed and interpreted by myself. ? ?Vitals:  ? 03/30/22 0520 03/30/22 0522  ?BP:  (!) 179/74  ?Pulse:  (!) 50  ?Resp:  16  ?Temp:  97.9 ?F (36.6 ?C)  ?TempSrc:  Oral  ?SpO2:  97%  ?Weight: 50 kg   ?Height: 5' (1.524 m)   ? ?Medications - No data to display ? ?No evidence of skull fracture or intracranial  injury on CT scan.  The patient's advanced directives were reviewed and she is on hospice and comfort measures only.  Even if she did have, for example, subdural hematoma she appears comfortable at this time a
